# Patient Record
Sex: Female | Born: 1940 | Race: White | Hispanic: No | Marital: Married | Smoking: Never smoker
Health system: Southern US, Community
[De-identification: ages and names within clinical notes are randomized; demographics above are authoritative.]

## PROBLEM LIST (undated history)

## (undated) DIAGNOSIS — E785 Hyperlipidemia, unspecified: Secondary | ICD-10-CM

## (undated) DIAGNOSIS — T8859XA Other complications of anesthesia, initial encounter: Secondary | ICD-10-CM

## (undated) DIAGNOSIS — E119 Type 2 diabetes mellitus without complications: Secondary | ICD-10-CM

## (undated) DIAGNOSIS — R112 Nausea with vomiting, unspecified: Secondary | ICD-10-CM

## (undated) DIAGNOSIS — Z9889 Other specified postprocedural states: Secondary | ICD-10-CM

## (undated) DIAGNOSIS — T7840XA Allergy, unspecified, initial encounter: Secondary | ICD-10-CM

## (undated) DIAGNOSIS — I10 Essential (primary) hypertension: Secondary | ICD-10-CM

## (undated) DIAGNOSIS — D649 Anemia, unspecified: Secondary | ICD-10-CM

## (undated) HISTORY — DX: Type 2 diabetes mellitus without complications: E11.9

## (undated) HISTORY — DX: Anemia, unspecified: D64.9

## (undated) HISTORY — DX: Allergy, unspecified, initial encounter: T78.40XA

## (undated) HISTORY — PX: CHOLECYSTECTOMY: SHX55

## (undated) HISTORY — PX: APPENDECTOMY: SHX54

## (undated) HISTORY — PX: ABDOMINAL HYSTERECTOMY: SHX81

## (undated) HISTORY — DX: Hyperlipidemia, unspecified: E78.5

## (undated) HISTORY — DX: Essential (primary) hypertension: I10

---

## 2004-03-06 ENCOUNTER — Other Ambulatory Visit: Admission: RE | Admit: 2004-03-06 | Discharge: 2004-03-06 | Payer: Self-pay | Admitting: Family Medicine

## 2004-03-06 ENCOUNTER — Ambulatory Visit (HOSPITAL_COMMUNITY): Admission: RE | Admit: 2004-03-06 | Discharge: 2004-03-06 | Payer: Self-pay | Admitting: Family Medicine

## 2010-06-26 ENCOUNTER — Encounter: Admission: RE | Admit: 2010-06-26 | Discharge: 2010-06-26 | Payer: Self-pay | Admitting: Orthopedic Surgery

## 2011-09-02 ENCOUNTER — Other Ambulatory Visit: Payer: Self-pay | Admitting: Family Medicine

## 2011-09-02 ENCOUNTER — Ambulatory Visit
Admission: RE | Admit: 2011-09-02 | Discharge: 2011-09-02 | Disposition: A | Discharge status: Home / Self Care | Payer: Medicare Other | Source: Ambulatory Visit | Attending: Family Medicine | Admitting: Family Medicine

## 2011-09-02 DIAGNOSIS — M7989 Other specified soft tissue disorders: Secondary | ICD-10-CM

## 2015-05-30 DIAGNOSIS — Z961 Presence of intraocular lens: Secondary | ICD-10-CM | POA: Diagnosis not present

## 2015-05-30 DIAGNOSIS — H25813 Combined forms of age-related cataract, bilateral: Secondary | ICD-10-CM | POA: Diagnosis not present

## 2015-08-25 DIAGNOSIS — Z111 Encounter for screening for respiratory tuberculosis: Secondary | ICD-10-CM | POA: Diagnosis not present

## 2015-09-04 DIAGNOSIS — Z23 Encounter for immunization: Secondary | ICD-10-CM | POA: Diagnosis not present

## 2015-09-04 DIAGNOSIS — I10 Essential (primary) hypertension: Secondary | ICD-10-CM | POA: Diagnosis not present

## 2015-09-04 DIAGNOSIS — E7800 Pure hypercholesterolemia, unspecified: Secondary | ICD-10-CM | POA: Diagnosis not present

## 2015-09-04 DIAGNOSIS — K219 Gastro-esophageal reflux disease without esophagitis: Secondary | ICD-10-CM | POA: Diagnosis not present

## 2015-09-04 DIAGNOSIS — E119 Type 2 diabetes mellitus without complications: Secondary | ICD-10-CM | POA: Diagnosis not present

## 2017-04-08 ENCOUNTER — Encounter: Payer: Self-pay | Admitting: Physician Assistant

## 2017-04-08 ENCOUNTER — Ambulatory Visit (INDEPENDENT_AMBULATORY_CARE_PROVIDER_SITE_OTHER): Payer: Medicare Other | Admitting: Physician Assistant

## 2017-04-08 VITALS — BP 146/90 | HR 66 | Temp 98.6°F | Resp 16 | Ht 61.25 in | Wt 183.8 lb

## 2017-04-08 DIAGNOSIS — J209 Acute bronchitis, unspecified: Secondary | ICD-10-CM | POA: Diagnosis not present

## 2017-04-08 MED ORDER — AZITHROMYCIN 250 MG PO TABS
ORAL_TABLET | ORAL | 0 refills | Status: AC
Start: 2017-04-08 — End: 2017-04-13

## 2017-04-08 MED ORDER — GUAIFENESIN ER 1200 MG PO TB12: 1.0000 | ORAL_TABLET | Freq: Two times a day (BID) | ORAL | 0 refills | Status: AC

## 2017-04-08 MED ORDER — PROMETHAZINE-DM 6.25-15 MG/5ML PO SYRP: 2.5000 mL | ORAL_SOLUTION | Freq: Four times a day (QID) | ORAL | 0 refills | Status: DC | PRN

## 2017-04-08 NOTE — Patient Instructions (Addendum)
  Please push fluids.  Tylenol and Motrin for fever and body aches.      Nasal saline spray can be helpful to keep the mucus membranes moist and thin the nasal mucus    IF you received an x-ray today, you will receive an invoice from [facility]. Please contact [facility] at [phone] with questions or concerns regarding your invoice.   IF you received labwork today, you will receive an invoice from [facility]. Please contact [facility] at [phone] with questions or concerns regarding your invoice.   Our billing staff will not be able to assist you with questions regarding bills from these companies.  You will be contacted with the lab results as soon as they are available. The fastest way to get your results is to activate your My Chart account. Instructions are located on the last page of this paperwork. If you have not heard from us regarding the results in 2 weeks, please contact this office.     

## 2017-04-08 NOTE — Progress Notes (Signed)
   Amanda Gordon  MRN: [id number] DOB: 15-Nov-1940  PCP: System, Provider Not In  Chief Complaint  Patient presents with  . Chest congestion    symptoms x 2 weeks  . Sore Throat  . Nasal Congestion  . Cough    Subjective:  Pt presents to clinic for cold symptoms for 2 weeks.  She started with a sore throat that is starting to improve - her congestion is just starting to get better  - she has had cough since the beginning - the cough is worse at night and overall it is not much better.  The cough is dry but she tries to stop the cough to not cough up phlegm - the cough is deep in her chest.  She has been using salt warm gargles, cold preps  Review of Systems  Constitutional: Negative for chills and fever.  HENT: Positive for congestion, postnasal drip, rhinorrhea (white) and sore throat (improved).   Respiratory: Positive for cough.   Gastrointestinal: Positive for vomiting (1 episode).  Allergic/Immunologic: Positive for environmental allergies (this is different).    There are no active problems to display for this patient.   No current outpatient prescriptions on file prior to visit.   No current facility-administered medications on file prior to visit.     Allergies  Allergen Reactions  . Codeine Nausea Only    Pt patients past, family and social history were reviewed and updated.   Objective:  BP (!) 146/90   Pulse 66   Temp 98.6 F (37 C) (Oral)   Resp 16   Ht 5' 1.25" (1.556 m)   Wt 183 lb 12.8 oz (83.4 kg)   SpO2 95%   BMI 34.45 kg/m   Physical Exam  Constitutional: She is oriented to person, place, and time and well-developed, well-nourished, and in no distress.  HENT:  Head: Normocephalic and atraumatic.  Right Ear: Hearing, tympanic membrane, external ear and ear canal normal.  Left Ear: Hearing, tympanic membrane, external ear and ear canal normal.  Nose: Mucosal edema (pale and swollen) present.  Mouth/Throat: Uvula is midline, oropharynx is  clear and moist and mucous membranes are normal.  Eyes: Conjunctivae are normal.  Neck: Normal range of motion.  Cardiovascular: Normal rate, regular rhythm and normal heart sounds.   No murmur heard. Pulmonary/Chest: Effort normal and breath sounds normal. She has no wheezes. She exhibits no tenderness.  Deep cough   Neurological: She is alert and oriented to person, place, and time. Gait normal.  Skin: Skin is warm and dry.  Psychiatric: Mood, memory, affect and judgment normal.  Vitals reviewed.   Assessment and Plan :  Acute bronchitis, unspecified organism - Plan: promethazine-dextromethorphan (PROMETHAZINE-DM) 6.25-15 MG/5ML syrup, Guaifenesin (MUCINEX MAXIMUM STRENGTH) 1200 MG TB12, azithromycin (ZITHROMAX Z-PAK) 250 MG tablet, Care order/instruction:  Due to length of time of illness will cover for bacterial bronchitis - pt should use mucinex to help thin the mucus that sounds like is in her chest - she will use cough medications at night - she will use all of abx and push fluids - it is ok to use tylenol and motrin if she does not feel good  Windell Hummingbird PA-C  [facility] [facility] 04/08/2017 11:27 AM

## 2020-07-14 DIAGNOSIS — E785 Hyperlipidemia, unspecified: Secondary | ICD-10-CM | POA: Diagnosis not present

## 2020-07-14 DIAGNOSIS — K219 Gastro-esophageal reflux disease without esophagitis: Secondary | ICD-10-CM | POA: Diagnosis not present

## 2020-07-14 DIAGNOSIS — M1990 Unspecified osteoarthritis, unspecified site: Secondary | ICD-10-CM | POA: Diagnosis not present

## 2020-07-14 DIAGNOSIS — I10 Essential (primary) hypertension: Secondary | ICD-10-CM | POA: Diagnosis not present

## 2020-07-14 DIAGNOSIS — E669 Obesity, unspecified: Secondary | ICD-10-CM | POA: Diagnosis not present

## 2020-07-14 DIAGNOSIS — Z7984 Long term (current) use of oral hypoglycemic drugs: Secondary | ICD-10-CM | POA: Diagnosis not present

## 2020-07-14 DIAGNOSIS — Z6833 Body mass index (BMI) 33.0-33.9, adult: Secondary | ICD-10-CM | POA: Diagnosis not present

## 2020-07-14 DIAGNOSIS — G8929 Other chronic pain: Secondary | ICD-10-CM | POA: Diagnosis not present

## 2020-07-14 DIAGNOSIS — E119 Type 2 diabetes mellitus without complications: Secondary | ICD-10-CM | POA: Diagnosis not present

## 2020-08-11 DIAGNOSIS — Z1231 Encounter for screening mammogram for malignant neoplasm of breast: Secondary | ICD-10-CM | POA: Diagnosis not present

## 2020-10-13 DIAGNOSIS — M810 Age-related osteoporosis without current pathological fracture: Secondary | ICD-10-CM | POA: Diagnosis not present

## 2020-10-13 DIAGNOSIS — E1169 Type 2 diabetes mellitus with other specified complication: Secondary | ICD-10-CM | POA: Diagnosis not present

## 2020-10-13 DIAGNOSIS — I10 Essential (primary) hypertension: Secondary | ICD-10-CM | POA: Diagnosis not present

## 2020-10-13 DIAGNOSIS — Z1389 Encounter for screening for other disorder: Secondary | ICD-10-CM | POA: Diagnosis not present

## 2020-10-13 DIAGNOSIS — E7800 Pure hypercholesterolemia, unspecified: Secondary | ICD-10-CM | POA: Diagnosis not present

## 2020-10-13 DIAGNOSIS — E559 Vitamin D deficiency, unspecified: Secondary | ICD-10-CM | POA: Diagnosis not present

## 2020-10-13 DIAGNOSIS — Z0000 Encounter for general adult medical examination without abnormal findings: Secondary | ICD-10-CM | POA: Diagnosis not present

## 2020-10-13 DIAGNOSIS — K219 Gastro-esophageal reflux disease without esophagitis: Secondary | ICD-10-CM | POA: Diagnosis not present

## 2020-11-13 DIAGNOSIS — Z780 Asymptomatic menopausal state: Secondary | ICD-10-CM | POA: Diagnosis not present

## 2020-11-13 DIAGNOSIS — M8589 Other specified disorders of bone density and structure, multiple sites: Secondary | ICD-10-CM | POA: Diagnosis not present

## 2021-02-02 DIAGNOSIS — M1612 Unilateral primary osteoarthritis, left hip: Secondary | ICD-10-CM | POA: Diagnosis not present

## 2021-02-02 DIAGNOSIS — M1712 Unilateral primary osteoarthritis, left knee: Secondary | ICD-10-CM | POA: Diagnosis not present

## 2021-02-20 DIAGNOSIS — M25562 Pain in left knee: Secondary | ICD-10-CM | POA: Diagnosis not present

## 2021-03-02 DIAGNOSIS — M25562 Pain in left knee: Secondary | ICD-10-CM | POA: Diagnosis not present

## 2021-03-11 DIAGNOSIS — E1169 Type 2 diabetes mellitus with other specified complication: Secondary | ICD-10-CM | POA: Diagnosis not present

## 2021-03-11 DIAGNOSIS — M1712 Unilateral primary osteoarthritis, left knee: Secondary | ICD-10-CM | POA: Diagnosis not present

## 2021-03-11 DIAGNOSIS — Z01818 Encounter for other preprocedural examination: Secondary | ICD-10-CM | POA: Diagnosis not present

## 2021-03-11 DIAGNOSIS — Z7984 Long term (current) use of oral hypoglycemic drugs: Secondary | ICD-10-CM | POA: Diagnosis not present

## 2021-03-11 DIAGNOSIS — I10 Essential (primary) hypertension: Secondary | ICD-10-CM | POA: Diagnosis not present

## 2021-03-11 DIAGNOSIS — E7800 Pure hypercholesterolemia, unspecified: Secondary | ICD-10-CM | POA: Diagnosis not present

## 2021-03-30 DIAGNOSIS — I10 Essential (primary) hypertension: Secondary | ICD-10-CM | POA: Diagnosis not present

## 2021-03-30 DIAGNOSIS — E559 Vitamin D deficiency, unspecified: Secondary | ICD-10-CM | POA: Diagnosis not present

## 2021-03-30 DIAGNOSIS — M810 Age-related osteoporosis without current pathological fracture: Secondary | ICD-10-CM | POA: Diagnosis not present

## 2021-03-30 DIAGNOSIS — K219 Gastro-esophageal reflux disease without esophagitis: Secondary | ICD-10-CM | POA: Diagnosis not present

## 2021-03-30 DIAGNOSIS — E7800 Pure hypercholesterolemia, unspecified: Secondary | ICD-10-CM | POA: Diagnosis not present

## 2021-03-30 DIAGNOSIS — E1169 Type 2 diabetes mellitus with other specified complication: Secondary | ICD-10-CM | POA: Diagnosis not present

## 2021-03-30 DIAGNOSIS — J309 Allergic rhinitis, unspecified: Secondary | ICD-10-CM | POA: Diagnosis not present

## 2021-04-22 DIAGNOSIS — H02831 Dermatochalasis of right upper eyelid: Secondary | ICD-10-CM | POA: Diagnosis not present

## 2021-04-22 DIAGNOSIS — E119 Type 2 diabetes mellitus without complications: Secondary | ICD-10-CM | POA: Diagnosis not present

## 2021-04-22 DIAGNOSIS — Z961 Presence of intraocular lens: Secondary | ICD-10-CM | POA: Diagnosis not present

## 2021-04-22 DIAGNOSIS — H26493 Other secondary cataract, bilateral: Secondary | ICD-10-CM | POA: Diagnosis not present

## 2021-04-22 DIAGNOSIS — H1045 Other chronic allergic conjunctivitis: Secondary | ICD-10-CM | POA: Diagnosis not present

## 2021-04-22 DIAGNOSIS — H02834 Dermatochalasis of left upper eyelid: Secondary | ICD-10-CM | POA: Diagnosis not present

## 2021-04-22 DIAGNOSIS — H35413 Lattice degeneration of retina, bilateral: Secondary | ICD-10-CM | POA: Diagnosis not present

## 2021-04-22 DIAGNOSIS — H4321 Crystalline deposits in vitreous body, right eye: Secondary | ICD-10-CM | POA: Diagnosis not present

## 2021-04-22 DIAGNOSIS — H04123 Dry eye syndrome of bilateral lacrimal glands: Secondary | ICD-10-CM | POA: Diagnosis not present

## 2021-04-22 DIAGNOSIS — H31091 Other chorioretinal scars, right eye: Secondary | ICD-10-CM | POA: Diagnosis not present

## 2021-05-22 DIAGNOSIS — U071 COVID-19: Secondary | ICD-10-CM | POA: Diagnosis not present

## 2021-06-15 DIAGNOSIS — E1169 Type 2 diabetes mellitus with other specified complication: Secondary | ICD-10-CM | POA: Diagnosis not present

## 2021-06-30 DIAGNOSIS — E1169 Type 2 diabetes mellitus with other specified complication: Secondary | ICD-10-CM | POA: Diagnosis not present

## 2021-06-30 DIAGNOSIS — R110 Nausea: Secondary | ICD-10-CM | POA: Diagnosis not present

## 2021-07-07 DIAGNOSIS — M1712 Unilateral primary osteoarthritis, left knee: Secondary | ICD-10-CM | POA: Diagnosis not present

## 2021-07-07 DIAGNOSIS — M1612 Unilateral primary osteoarthritis, left hip: Secondary | ICD-10-CM | POA: Diagnosis not present

## 2021-07-14 NOTE — Progress Notes (Signed)
Sent message, via epic in basket, requesting orders in epic from surgeon.  

## 2021-07-17 DIAGNOSIS — K921 Melena: Secondary | ICD-10-CM | POA: Diagnosis not present

## 2021-07-17 DIAGNOSIS — K5900 Constipation, unspecified: Secondary | ICD-10-CM | POA: Diagnosis not present

## 2021-07-17 DIAGNOSIS — R110 Nausea: Secondary | ICD-10-CM | POA: Diagnosis not present

## 2021-07-21 ENCOUNTER — Encounter: Payer: Self-pay | Admitting: Physician Assistant

## 2021-07-21 ENCOUNTER — Encounter: Payer: Self-pay | Admitting: Orthopedic Surgery

## 2021-07-21 DIAGNOSIS — T7840XA Allergy, unspecified, initial encounter: Secondary | ICD-10-CM | POA: Insufficient documentation

## 2021-07-21 DIAGNOSIS — M1712 Unilateral primary osteoarthritis, left knee: Secondary | ICD-10-CM

## 2021-07-21 HISTORY — DX: Unilateral primary osteoarthritis, left knee: M17.12

## 2021-07-21 NOTE — Patient Instructions (Signed)
DUE TO COVID-19 ONLY ONE VISITOR IS ALLOWED TO COME WITH YOU AND STAY IN THE WAITING ROOM ONLY DURING PRE OP AND PROCEDURE.   **NO VISITORS ARE ALLOWED IN THE SHORT STAY AREA OR RECOVERY ROOM!!**  IF YOU WILL BE ADMITTED INTO THE HOSPITAL YOU ARE ALLOWED ONLY TWO SUPPORT PEOPLE DURING VISITATION HOURS ONLY (10AM -8PM)   The support person(s) may change daily. The support person(s) must pass our screening, gel in and out, and wear a mask at all times, including in the patient's room. Patients must also wear a mask when staff or their support person are in the room.  No visitors under the age of 4. Any visitor under the age of 13 must be accompanied by an adult.    COVID SWAB TESTING MUST BE COMPLETED ON:  07/30/21 **MUST PRESENT COMPLETED FORM AT TESTING SITE**    [place]  (backside of the building) You are not required to quarantine, however you are required to wear a well-fitted mask when you are out and around people not in your household.  Hand Hygiene often Do NOT share personal items Notify your provider if you are in close contact with someone who has COVID or you develop fever 100.4 or greater, new onset of sneezing, cough, sore throat, shortness of breath or body aches.  [place], [address], must go inside of the hospital, NOT A DRIVE THRU!  (Must self quarantine after testing. Follow instructions on handout.)       Your procedure is scheduled on: 08/03/21   Report to [facility] Main Entrance   Report to Short Stay at 5:15 AM   [facility])   Call this number if you have problems the morning of surgery [phone]   Do not eat food :After Midnight.   May have liquids until : 4:15 AM   day of surgery  CLEAR LIQUID DIET  Foods Allowed                                                                     Foods Excluded  Water, Black Coffee and tea, regular and decaf                              liquids that you cannot  Plain Jell-O in any flavor  (No red)                                           see through such as: Fruit ices (not with fruit pulp)                                     milk, soups, orange juice              Iced Popsicles (No red)  All solid food                                   Apple juices Sports drinks like Gatorade (No red) Lightly seasoned clear broth or consume(fat free) Sugar,  Sample Menu Breakfast                                Lunch                                     Supper Cranberry juice                    Beef broth                            Chicken broth Jell-O                                     Grape juice                           Apple juice Coffee or tea                        Jell-O                                      Popsicle                                                Coffee or tea                        Coffee or tea      Complete one Gatorade drink the morning of surgery at : 4:15 AM      the day of surgery.     The day of surgery:  Drink ONE (1) Pre-Surgery Clear Ensure or G2 by am the morning of surgery. Drink in one sitting. Do not sip.  This drink was given to you during your hospital  pre-op appointment visit. Nothing else to drink after completing the  Pre-Surgery Clear Ensure or G2.          If you have questions, please contact your surgeon's office.     Oral Hygiene is also important to reduce your risk of infection.                                    Remember - BRUSH YOUR TEETH THE MORNING OF SURGERY WITH YOUR REGULAR TOOTHPASTE   Do NOT smoke after Midnight   Take these medicines the morning of surgery with A SIP OF WATER: metoprolol,amlodipine,omeprazole.  How to Manage Your Diabetes Before and After Surgery  Why is it important to control my blood sugar before and after surgery? Improving blood sugar levels  before and after surgery helps healing and  can limit problems. A way of improving blood sugar control is eating a healthy diet by:  Eating less sugar and carbohydrates  Increasing activity/exercise  Talking with your doctor about reaching your blood sugar goals High blood sugars (greater than 180 mg/dL) can raise your risk of infections and slow your recovery, so you will need to focus on controlling your diabetes during the weeks before surgery. Make sure that the doctor who takes care of your diabetes knows about your planned surgery including the date and location.  How do I manage my blood sugar before surgery? Check your blood sugar at least 4 times a day, starting 2 days before surgery, to make sure that the level is not too high or low. Check your blood sugar the morning of your surgery when you wake up and every 2 hours until you get to the Short Stay unit. If your blood sugar is less than 70 mg/dL, you will need to treat for low blood sugar: Do not take insulin. Treat a low blood sugar (less than 70 mg/dL) with  cup of clear juice (cranberry or apple), 4 glucose tablets, OR glucose gel. Recheck blood sugar in 15 minutes after treatment (to make sure it is greater than 70 mg/dL). If your blood sugar is not greater than 70 mg/dL on recheck, call [phone] for further instructions. Report your blood sugar to the short stay nurse when you get to Short Stay.  If you are admitted to the hospital after surgery: Your blood sugar will be checked by the staff and you will probably be given insulin after surgery (instead of oral diabetes medicines) to make sure you have good blood sugar levels. The goal for blood sugar control after surgery is 80-180 mg/dL.   WHAT DO I DO ABOUT MY DIABETES MEDICATION?  Do not take oral diabetes medicines (pills) the morning of surgery.  THE Day BEFORE SURGERY, take metformin as usual.       THE MORNING OF SURGERY, DO NOT TAKE ANY ORAL DIABETIC MEDICATIONS DAY OF YOUR SURGERY                               You may not have any metal on your body including hair pins, jewelry, and body piercing             Do not wear make-up, lotions, powders, perfumes/cologne, or deodorant  Do not wear nail polish including gel and S&S, artificial/acrylic nails, or any other type of covering on natural nails including finger and toenails. If you have artificial nails, gel coating, etc. that needs to be removed by a nail salon please have this removed prior to surgery or surgery may need to be canceled/ delayed if the surgeon/ anesthesia feels like they are unable to be safely monitored.   Do not shave  48 hours prior to surgery.    Do not bring valuables to the hospital. [place].   Contacts, dentures or bridgework may not be worn into surgery.   Bring small overnight bag day of surgery.    Patients discharged on the day of surgery will not be allowed to drive home.   Special Instructions: Bring a copy of your healthcare power of attorney and living will documents  the day of surgery if you haven't scanned them before.              Please read over the following fact sheets you were given: IF YOU HAVE QUESTIONS ABOUT YOUR PRE-OP INSTRUCTIONS PLEASE CALL [phone]   [facility] - Preparing for Surgery Before surgery, you can play an important role.  Because skin is not sterile, your skin needs to be as free of germs as possible.  You can reduce the number of germs on your skin by washing with CHG (chlorahexidine gluconate) soap before surgery.  CHG is an antiseptic cleaner which kills germs and bonds with the skin to continue killing germs even after washing. Please DO NOT use if you have an allergy to CHG or antibacterial soaps.  If your skin becomes reddened/irritated stop using the CHG and inform your nurse when you arrive at Short Stay. Do not shave (including legs and underarms) for at least 48 hours prior to the first CHG shower.  You  may shave your face/neck. Please follow these instructions carefully:  1.  Shower with CHG Soap the night before surgery and the  morning of Surgery.  2.  If you choose to wash your hair, wash your hair first as usual with your  normal  shampoo.  3.  After you shampoo, rinse your hair and body thoroughly to remove the  shampoo.                           4.  Use CHG as you would any other liquid soap.  You can apply chg directly  to the skin and wash                       Gently with a scrungie or clean washcloth.  5.  Apply the CHG Soap to your body ONLY FROM THE NECK DOWN.   Do not use on face/ open                           Wound or open sores. Avoid contact with eyes, ears mouth and genitals (private parts).                       Wash face,  Genitals (private parts) with your normal soap.             6.  Wash thoroughly, paying special attention to the area where your surgery  will be performed.  7.  Thoroughly rinse your body with warm water from the neck down.  8.  DO NOT shower/wash with your normal soap after using and rinsing off  the CHG Soap.                9.  Pat yourself dry with a clean towel.            10.  Wear clean pajamas.            11.  Place clean sheets on your bed the night of your first shower and do not  sleep with pets. Day of Surgery : Do not apply any lotions/deodorants the morning of surgery.  Please wear clean clothes to the hospital/surgery center.  FAILURE TO FOLLOW THESE INSTRUCTIONS MAY RESULT IN THE CANCELLATION OF YOUR SURGERY PATIENT SIGNATURE_________________________________  NURSE SIGNATURE__________________________________  ________________________________________________________________________   Adam Phenix  An incentive spirometer is a tool that  can help keep your lungs clear and active. This tool measures how well you are filling your lungs with each breath. Taking long deep breaths may help reverse or decrease the chance of developing  breathing (pulmonary) problems (especially infection) following: A long period of time when you are unable to move or be active. BEFORE THE PROCEDURE  If the spirometer includes an indicator to show your best effort, your nurse or respiratory therapist will set it to a desired goal. If possible, sit up straight or lean slightly forward. Try not to slouch. Hold the incentive spirometer in an upright position. INSTRUCTIONS FOR USE  Sit on the edge of your bed if possible, or sit up as far as you can in bed or on a chair. Hold the incentive spirometer in an upright position. Breathe out normally. Place the mouthpiece in your mouth and seal your lips tightly around it. Breathe in slowly and as deeply as possible, raising the piston or the ball toward the top of the column. Hold your breath for 3-5 seconds or for as long as possible. Allow the piston or ball to fall to the bottom of the column. Remove the mouthpiece from your mouth and breathe out normally. Rest for a few seconds and repeat Steps 1 through 7 at least 10 times every 1-2 hours when you are awake. Take your time and take a few normal breaths between deep breaths. The spirometer may include an indicator to show your best effort. Use the indicator as a goal to work toward during each repetition. After each set of 10 deep breaths, practice coughing to be sure your lungs are clear. If you have an incision (the cut made at the time of surgery), support your incision when coughing by placing a pillow or rolled up towels firmly against it. Once you are able to get out of bed, walk around indoors and cough well. You may stop using the incentive spirometer when instructed by your caregiver.  RISKS AND COMPLICATIONS Take your time so you do not get dizzy or light-headed. If you are in pain, you may need to take or ask for pain medication before doing incentive spirometry. It is harder to take a deep breath if you are having pain. AFTER USE Rest  and breathe slowly and easily. It can be helpful to keep track of a log of your progress. Your caregiver can provide you with a simple table to help with this. If you are using the spirometer at home, follow these instructions: [place] IF:  You are having difficultly using the spirometer. You have trouble using the spirometer as often as instructed. Your pain medication is not giving enough relief while using the spirometer. You develop fever of 100.5 F (38.1 C) or higher. SEEK IMMEDIATE MEDICAL CARE IF:  You cough up bloody sputum that had not been present before. You develop fever of 102 F (38.9 C) or greater. You develop worsening pain at or near the incision site. MAKE SURE YOU:  Understand these instructions. Will watch your condition. Will get help right away if you are not doing well or get worse. Document Released: 02/14/2007 Document Revised: 12/27/2011 Document Reviewed: 04/17/2007 [facility] Patient Information 2014 [place], [place].   ________________________________________________________________________

## 2021-07-23 ENCOUNTER — Encounter (HOSPITAL_COMMUNITY)
Admission: RE | Admit: 2021-07-23 | Discharge: 2021-07-23 | Disposition: A | Discharge status: Home / Self Care | Payer: Medicare Other | Source: Ambulatory Visit | Attending: Orthopedic Surgery | Admitting: Orthopedic Surgery

## 2021-07-23 ENCOUNTER — Other Ambulatory Visit: Payer: Self-pay

## 2021-07-23 ENCOUNTER — Encounter (HOSPITAL_COMMUNITY): Payer: Self-pay

## 2021-07-23 DIAGNOSIS — R0902 Hypoxemia: Secondary | ICD-10-CM | POA: Diagnosis not present

## 2021-07-23 DIAGNOSIS — E861 Hypovolemia: Secondary | ICD-10-CM | POA: Diagnosis not present

## 2021-07-23 DIAGNOSIS — E441 Mild protein-calorie malnutrition: Secondary | ICD-10-CM | POA: Diagnosis not present

## 2021-07-23 DIAGNOSIS — K2289 Other specified disease of esophagus: Secondary | ICD-10-CM | POA: Diagnosis not present

## 2021-07-23 DIAGNOSIS — Z859 Personal history of malignant neoplasm, unspecified: Secondary | ICD-10-CM | POA: Diagnosis not present

## 2021-07-23 DIAGNOSIS — J90 Pleural effusion, not elsewhere classified: Secondary | ICD-10-CM | POA: Diagnosis not present

## 2021-07-23 DIAGNOSIS — C786 Secondary malignant neoplasm of retroperitoneum and peritoneum: Secondary | ICD-10-CM | POA: Diagnosis not present

## 2021-07-23 DIAGNOSIS — E119 Type 2 diabetes mellitus without complications: Secondary | ICD-10-CM | POA: Insufficient documentation

## 2021-07-23 DIAGNOSIS — N83201 Unspecified ovarian cyst, right side: Secondary | ICD-10-CM | POA: Diagnosis not present

## 2021-07-23 DIAGNOSIS — C800 Disseminated malignant neoplasm, unspecified: Secondary | ICD-10-CM | POA: Diagnosis not present

## 2021-07-23 DIAGNOSIS — J9811 Atelectasis: Secondary | ICD-10-CM | POA: Diagnosis not present

## 2021-07-23 DIAGNOSIS — R180 Malignant ascites: Secondary | ICD-10-CM | POA: Diagnosis not present

## 2021-07-23 DIAGNOSIS — I2510 Atherosclerotic heart disease of native coronary artery without angina pectoris: Secondary | ICD-10-CM | POA: Diagnosis not present

## 2021-07-23 DIAGNOSIS — Z20822 Contact with and (suspected) exposure to covid-19: Secondary | ICD-10-CM | POA: Diagnosis not present

## 2021-07-23 DIAGNOSIS — R634 Abnormal weight loss: Secondary | ICD-10-CM | POA: Diagnosis not present

## 2021-07-23 DIAGNOSIS — D649 Anemia, unspecified: Secondary | ICD-10-CM | POA: Diagnosis not present

## 2021-07-23 DIAGNOSIS — Z9049 Acquired absence of other specified parts of digestive tract: Secondary | ICD-10-CM | POA: Diagnosis not present

## 2021-07-23 DIAGNOSIS — E871 Hypo-osmolality and hyponatremia: Secondary | ICD-10-CM | POA: Diagnosis not present

## 2021-07-23 DIAGNOSIS — Z794 Long term (current) use of insulin: Secondary | ICD-10-CM | POA: Diagnosis not present

## 2021-07-23 DIAGNOSIS — K6389 Other specified diseases of intestine: Secondary | ICD-10-CM | POA: Diagnosis not present

## 2021-07-23 DIAGNOSIS — R1900 Intra-abdominal and pelvic swelling, mass and lump, unspecified site: Secondary | ICD-10-CM | POA: Diagnosis not present

## 2021-07-23 DIAGNOSIS — Z833 Family history of diabetes mellitus: Secondary | ICD-10-CM | POA: Diagnosis not present

## 2021-07-23 DIAGNOSIS — R933 Abnormal findings on diagnostic imaging of other parts of digestive tract: Secondary | ICD-10-CM | POA: Diagnosis not present

## 2021-07-23 DIAGNOSIS — K5730 Diverticulosis of large intestine without perforation or abscess without bleeding: Secondary | ICD-10-CM | POA: Diagnosis not present

## 2021-07-23 DIAGNOSIS — R300 Dysuria: Secondary | ICD-10-CM | POA: Insufficient documentation

## 2021-07-23 DIAGNOSIS — Z01818 Encounter for other preprocedural examination: Secondary | ICD-10-CM | POA: Insufficient documentation

## 2021-07-23 DIAGNOSIS — E876 Hypokalemia: Secondary | ICD-10-CM | POA: Diagnosis not present

## 2021-07-23 DIAGNOSIS — E785 Hyperlipidemia, unspecified: Secondary | ICD-10-CM | POA: Diagnosis not present

## 2021-07-23 DIAGNOSIS — K3189 Other diseases of stomach and duodenum: Secondary | ICD-10-CM | POA: Diagnosis not present

## 2021-07-23 DIAGNOSIS — R109 Unspecified abdominal pain: Secondary | ICD-10-CM | POA: Diagnosis not present

## 2021-07-23 DIAGNOSIS — R9431 Abnormal electrocardiogram [ECG] [EKG]: Secondary | ICD-10-CM | POA: Diagnosis not present

## 2021-07-23 DIAGNOSIS — N83291 Other ovarian cyst, right side: Secondary | ICD-10-CM | POA: Diagnosis present

## 2021-07-23 DIAGNOSIS — Z8249 Family history of ischemic heart disease and other diseases of the circulatory system: Secondary | ICD-10-CM | POA: Diagnosis not present

## 2021-07-23 DIAGNOSIS — E46 Unspecified protein-calorie malnutrition: Secondary | ICD-10-CM | POA: Diagnosis not present

## 2021-07-23 DIAGNOSIS — Z90710 Acquired absence of both cervix and uterus: Secondary | ICD-10-CM | POA: Diagnosis not present

## 2021-07-23 DIAGNOSIS — R9389 Abnormal findings on diagnostic imaging of other specified body structures: Secondary | ICD-10-CM | POA: Diagnosis not present

## 2021-07-23 DIAGNOSIS — R404 Transient alteration of awareness: Secondary | ICD-10-CM | POA: Diagnosis not present

## 2021-07-23 DIAGNOSIS — R799 Abnormal finding of blood chemistry, unspecified: Secondary | ICD-10-CM | POA: Diagnosis not present

## 2021-07-23 DIAGNOSIS — R110 Nausea: Secondary | ICD-10-CM | POA: Diagnosis not present

## 2021-07-23 DIAGNOSIS — Z79899 Other long term (current) drug therapy: Secondary | ICD-10-CM | POA: Diagnosis not present

## 2021-07-23 DIAGNOSIS — Z743 Need for continuous supervision: Secondary | ICD-10-CM | POA: Diagnosis not present

## 2021-07-23 DIAGNOSIS — R188 Other ascites: Secondary | ICD-10-CM | POA: Diagnosis not present

## 2021-07-23 DIAGNOSIS — I10 Essential (primary) hypertension: Secondary | ICD-10-CM | POA: Diagnosis not present

## 2021-07-23 DIAGNOSIS — D7389 Other diseases of spleen: Secondary | ICD-10-CM | POA: Diagnosis not present

## 2021-07-23 DIAGNOSIS — K2970 Gastritis, unspecified, without bleeding: Secondary | ICD-10-CM | POA: Diagnosis not present

## 2021-07-23 DIAGNOSIS — C801 Malignant (primary) neoplasm, unspecified: Secondary | ICD-10-CM | POA: Diagnosis not present

## 2021-07-23 DIAGNOSIS — D630 Anemia in neoplastic disease: Secondary | ICD-10-CM | POA: Diagnosis not present

## 2021-07-23 DIAGNOSIS — R000 Tachycardia, unspecified: Secondary | ICD-10-CM | POA: Diagnosis not present

## 2021-07-23 DIAGNOSIS — R590 Localized enlarged lymph nodes: Secondary | ICD-10-CM | POA: Diagnosis not present

## 2021-07-23 DIAGNOSIS — I700 Atherosclerosis of aorta: Secondary | ICD-10-CM | POA: Diagnosis not present

## 2021-07-23 DIAGNOSIS — M1712 Unilateral primary osteoarthritis, left knee: Secondary | ICD-10-CM | POA: Diagnosis not present

## 2021-07-23 HISTORY — DX: Other complications of anesthesia, initial encounter: T88.59XA

## 2021-07-23 HISTORY — DX: Other specified postprocedural states: Z98.890

## 2021-07-23 HISTORY — DX: Nausea with vomiting, unspecified: R11.2

## 2021-07-23 LAB — CBC WITH DIFFERENTIAL/PLATELET
Abs Immature Granulocytes: 0.03 10*3/uL (ref 0.00–0.07)
Basophils Absolute: 0.1 10*3/uL (ref 0.0–0.1)
Basophils Relative: 1 %
Eosinophils Absolute: 0.1 10*3/uL (ref 0.0–0.5)
Eosinophils Relative: 1 %
HCT: 45.2 % (ref 36.0–46.0)
Hemoglobin: 14.4 g/dL (ref 12.0–15.0)
Immature Granulocytes: 0 %
Lymphocytes Relative: 12 %
Lymphs Abs: 1.3 10*3/uL (ref 0.7–4.0)
MCH: 27.2 pg (ref 26.0–34.0)
MCHC: 31.9 g/dL (ref 30.0–36.0)
MCV: 85.4 fL (ref 80.0–100.0)
Monocytes Absolute: 0.7 10*3/uL (ref 0.1–1.0)
Monocytes Relative: 7 %
Neutro Abs: 8 10*3/uL — ABNORMAL HIGH (ref 1.7–7.7)
Neutrophils Relative %: 79 %
Platelets: 512 10*3/uL — ABNORMAL HIGH (ref 150–400)
RBC: 5.29 MIL/uL — ABNORMAL HIGH (ref 3.87–5.11)
RDW: 15.6 % — ABNORMAL HIGH (ref 11.5–15.5)
WBC: 10.2 10*3/uL (ref 4.0–10.5)
nRBC: 0 % (ref 0.0–0.2)

## 2021-07-23 LAB — COMPREHENSIVE METABOLIC PANEL
ALT: 8 U/L (ref 0–44)
AST: 19 U/L (ref 15–41)
Albumin: 3.2 g/dL — ABNORMAL LOW (ref 3.5–5.0)
Alkaline Phosphatase: 97 U/L (ref 38–126)
Anion gap: 13 (ref 5–15)
BUN: 22 mg/dL (ref 8–23)
CO2: 30 mmol/L (ref 22–32)
Calcium: 9.5 mg/dL (ref 8.9–10.3)
Chloride: 94 mmol/L — ABNORMAL LOW (ref 98–111)
Creatinine, Ser: 0.68 mg/dL (ref 0.44–1.00)
GFR, Estimated: >60 mL/min (ref >60)
Glucose, Bld: 129 mg/dL — ABNORMAL HIGH (ref 70–99)
Potassium: 3.3 mmol/L — ABNORMAL LOW (ref 3.5–5.1)
Sodium: 137 mmol/L (ref 135–145)
Total Bilirubin: 0.5 mg/dL (ref 0.3–1.2)
Total Protein: 7 g/dL (ref 6.5–8.1)

## 2021-07-23 LAB — SURGICAL PCR SCREEN
MRSA, PCR: NEGATIVE
Staphylococcus aureus: NEGATIVE

## 2021-07-23 LAB — GLUCOSE, CAPILLARY: Glucose-Capillary: 137 mg/dL — ABNORMAL HIGH (ref 70–99)

## 2021-07-23 LAB — HEMOGLOBIN A1C
Hgb A1c MFr Bld: 6.5 % — ABNORMAL HIGH (ref 4.8–5.6)
Mean Plasma Glucose: 139.85 mg/dL

## 2021-07-23 NOTE — Progress Notes (Signed)
COVID Vaccine Completed: Yes Date COVID Vaccine completed: 06/2020 x 3 COVID vaccine manufacturer: [employer] Test: 07/30/21  PCP - No PCP Cardiologist -   Chest x-ray -  EKG -  Stress Test -  ECHO -  Cardiac Cath -  Pacemaker/ICD device last checked:  Sleep Study -  CPAP -   Fasting Blood Sugar - 100's Checks Blood Sugar ___3__ times a day  Blood Thinner Instructions: Aspirin Instructions: Last Dose:  Anesthesia review: Hx: HTN,DIA  Patient denies shortness of breath, fever, cough and chest pain at PAT appointment   Patient verbalized understanding of instructions that were given to them at the PAT appointment. Patient was also instructed that they will need to review over the PAT instructions again at home before surgery.

## 2021-07-24 ENCOUNTER — Inpatient Hospital Stay (HOSPITAL_COMMUNITY)
Admission: EM | Admit: 2021-07-24 | Discharge: 2021-08-05 | DRG: 375 | Disposition: A | Discharge status: Home Health | Payer: Medicare Other | Attending: Internal Medicine | Admitting: Internal Medicine

## 2021-07-24 ENCOUNTER — Encounter (HOSPITAL_COMMUNITY): Payer: Self-pay | Admitting: Physician Assistant

## 2021-07-24 ENCOUNTER — Observation Stay (HOSPITAL_COMMUNITY): Payer: Medicare Other

## 2021-07-24 ENCOUNTER — Emergency Department (HOSPITAL_COMMUNITY): Payer: Medicare Other

## 2021-07-24 ENCOUNTER — Encounter (HOSPITAL_COMMUNITY): Payer: Self-pay

## 2021-07-24 DIAGNOSIS — R590 Localized enlarged lymph nodes: Secondary | ICD-10-CM | POA: Diagnosis present

## 2021-07-24 DIAGNOSIS — Z90710 Acquired absence of both cervix and uterus: Secondary | ICD-10-CM

## 2021-07-24 DIAGNOSIS — R52 Pain, unspecified: Secondary | ICD-10-CM

## 2021-07-24 DIAGNOSIS — Z9049 Acquired absence of other specified parts of digestive tract: Secondary | ICD-10-CM

## 2021-07-24 DIAGNOSIS — R188 Other ascites: Secondary | ICD-10-CM | POA: Diagnosis not present

## 2021-07-24 DIAGNOSIS — E871 Hypo-osmolality and hyponatremia: Secondary | ICD-10-CM | POA: Diagnosis present

## 2021-07-24 DIAGNOSIS — Z20822 Contact with and (suspected) exposure to covid-19: Secondary | ICD-10-CM | POA: Diagnosis present

## 2021-07-24 DIAGNOSIS — I700 Atherosclerosis of aorta: Secondary | ICD-10-CM | POA: Diagnosis present

## 2021-07-24 DIAGNOSIS — K2970 Gastritis, unspecified, without bleeding: Secondary | ICD-10-CM | POA: Diagnosis present

## 2021-07-24 DIAGNOSIS — C786 Secondary malignant neoplasm of retroperitoneum and peritoneum: Principal | ICD-10-CM | POA: Diagnosis present

## 2021-07-24 DIAGNOSIS — Z79899 Other long term (current) drug therapy: Secondary | ICD-10-CM

## 2021-07-24 DIAGNOSIS — Z833 Family history of diabetes mellitus: Secondary | ICD-10-CM

## 2021-07-24 DIAGNOSIS — I10 Essential (primary) hypertension: Secondary | ICD-10-CM | POA: Diagnosis present

## 2021-07-24 DIAGNOSIS — N83201 Unspecified ovarian cyst, right side: Secondary | ICD-10-CM

## 2021-07-24 DIAGNOSIS — N83209 Unspecified ovarian cyst, unspecified side: Secondary | ICD-10-CM

## 2021-07-24 DIAGNOSIS — E861 Hypovolemia: Secondary | ICD-10-CM | POA: Diagnosis present

## 2021-07-24 DIAGNOSIS — R634 Abnormal weight loss: Secondary | ICD-10-CM | POA: Diagnosis present

## 2021-07-24 DIAGNOSIS — Z794 Long term (current) use of insulin: Secondary | ICD-10-CM

## 2021-07-24 DIAGNOSIS — Z8249 Family history of ischemic heart disease and other diseases of the circulatory system: Secondary | ICD-10-CM

## 2021-07-24 DIAGNOSIS — R180 Malignant ascites: Secondary | ICD-10-CM | POA: Diagnosis not present

## 2021-07-24 DIAGNOSIS — E441 Mild protein-calorie malnutrition: Secondary | ICD-10-CM | POA: Diagnosis present

## 2021-07-24 DIAGNOSIS — E876 Hypokalemia: Secondary | ICD-10-CM | POA: Diagnosis present

## 2021-07-24 DIAGNOSIS — C800 Disseminated malignant neoplasm, unspecified: Secondary | ICD-10-CM | POA: Diagnosis present

## 2021-07-24 DIAGNOSIS — Z885 Allergy status to narcotic agent status: Secondary | ICD-10-CM

## 2021-07-24 DIAGNOSIS — E119 Type 2 diabetes mellitus without complications: Secondary | ICD-10-CM | POA: Diagnosis present

## 2021-07-24 DIAGNOSIS — N83291 Other ovarian cyst, right side: Secondary | ICD-10-CM | POA: Diagnosis present

## 2021-07-24 DIAGNOSIS — E785 Hyperlipidemia, unspecified: Secondary | ICD-10-CM | POA: Diagnosis present

## 2021-07-24 DIAGNOSIS — M1712 Unilateral primary osteoarthritis, left knee: Secondary | ICD-10-CM | POA: Diagnosis present

## 2021-07-24 DIAGNOSIS — Z7984 Long term (current) use of oral hypoglycemic drugs: Secondary | ICD-10-CM

## 2021-07-24 DIAGNOSIS — I2510 Atherosclerotic heart disease of native coronary artery without angina pectoris: Secondary | ICD-10-CM | POA: Diagnosis present

## 2021-07-24 DIAGNOSIS — D630 Anemia in neoplastic disease: Secondary | ICD-10-CM | POA: Diagnosis present

## 2021-07-24 DIAGNOSIS — C801 Malignant (primary) neoplasm, unspecified: Secondary | ICD-10-CM | POA: Diagnosis present

## 2021-07-24 DIAGNOSIS — R9431 Abnormal electrocardiogram [ECG] [EKG]: Secondary | ICD-10-CM | POA: Diagnosis present

## 2021-07-24 HISTORY — DX: Type 2 diabetes mellitus without complications: E11.9

## 2021-07-24 LAB — COMPREHENSIVE METABOLIC PANEL
ALT: 9 U/L (ref 0–44)
AST: 17 U/L (ref 15–41)
Albumin: 3.3 g/dL — ABNORMAL LOW (ref 3.5–5.0)
Alkaline Phosphatase: 93 U/L (ref 38–126)
Anion gap: 12 (ref 5–15)
BUN: 24 mg/dL — ABNORMAL HIGH (ref 8–23)
CO2: 31 mmol/L (ref 22–32)
Calcium: 9.3 mg/dL (ref 8.9–10.3)
Chloride: 90 mmol/L — ABNORMAL LOW (ref 98–111)
Creatinine, Ser: 0.76 mg/dL (ref 0.44–1.00)
GFR, Estimated: >60 mL/min (ref >60)
Glucose, Bld: 134 mg/dL — ABNORMAL HIGH (ref 70–99)
Potassium: 3.4 mmol/L — ABNORMAL LOW (ref 3.5–5.1)
Sodium: 133 mmol/L — ABNORMAL LOW (ref 135–145)
Total Bilirubin: 0.5 mg/dL (ref 0.3–1.2)
Total Protein: 6.7 g/dL (ref 6.5–8.1)

## 2021-07-24 LAB — CBC WITH DIFFERENTIAL/PLATELET
Abs Immature Granulocytes: 0.03 10*3/uL (ref 0.00–0.07)
Basophils Absolute: 0.1 10*3/uL (ref 0.0–0.1)
Basophils Relative: 1 %
Eosinophils Absolute: 0.1 10*3/uL (ref 0.0–0.5)
Eosinophils Relative: 1 %
HCT: 44.6 % (ref 36.0–46.0)
Hemoglobin: 14.3 g/dL (ref 12.0–15.0)
Immature Granulocytes: 0 %
Lymphocytes Relative: 9 %
Lymphs Abs: 0.9 10*3/uL (ref 0.7–4.0)
MCH: 27.2 pg (ref 26.0–34.0)
MCHC: 32.1 g/dL (ref 30.0–36.0)
MCV: 85 fL (ref 80.0–100.0)
Monocytes Absolute: 0.8 10*3/uL (ref 0.1–1.0)
Monocytes Relative: 8 %
Neutro Abs: 8.2 10*3/uL — ABNORMAL HIGH (ref 1.7–7.7)
Neutrophils Relative %: 81 %
Platelets: 477 10*3/uL — ABNORMAL HIGH (ref 150–400)
RBC: 5.25 MIL/uL — ABNORMAL HIGH (ref 3.87–5.11)
RDW: 15.3 % (ref 11.5–15.5)
WBC: 10 10*3/uL (ref 4.0–10.5)
nRBC: 0 % (ref 0.0–0.2)

## 2021-07-24 LAB — LACTATE DEHYDROGENASE, PLEURAL OR PERITONEAL FLUID: LD, Fluid: 323 U/L — ABNORMAL HIGH (ref 3–23)

## 2021-07-24 LAB — BODY FLUID CELL COUNT WITH DIFFERENTIAL
Eos, Fluid: 0 %
Lymphs, Fluid: 31 %
Monocyte-Macrophage-Serous Fluid: 61 % (ref 50–90)
Neutrophil Count, Fluid: 8 % (ref 0–25)
Total Nucleated Cell Count, Fluid: 489 cu mm (ref 0–1000)

## 2021-07-24 LAB — ALBUMIN, PLEURAL OR PERITONEAL FLUID: Albumin, Fluid: 2.7 g/dL

## 2021-07-24 LAB — URINE CULTURE: Culture: <10000 — AB

## 2021-07-24 LAB — GLUCOSE, PLEURAL OR PERITONEAL FLUID: Glucose, Fluid: 66 mg/dL

## 2021-07-24 LAB — RESP PANEL BY RT-PCR (FLU A&B, COVID) ARPGX2
Influenza A by PCR: NEGATIVE
Influenza B by PCR: NEGATIVE
SARS Coronavirus 2 by RT PCR: NEGATIVE

## 2021-07-24 LAB — PHOSPHORUS: Phosphorus: 4 mg/dL (ref 2.5–4.6)

## 2021-07-24 LAB — PROTEIN, PLEURAL OR PERITONEAL FLUID: Total protein, fluid: 4.2 g/dL

## 2021-07-24 LAB — LIPASE, BLOOD: Lipase: 18 U/L (ref 11–51)

## 2021-07-24 LAB — MAGNESIUM: Magnesium: 1.9 mg/dL (ref 1.7–2.4)

## 2021-07-24 MED ORDER — LISINOPRIL 20 MG PO TABS
20.0000 mg | ORAL_TABLET | Freq: Every day | ORAL | Status: DC
  Administered 2021-07-25 – 2021-07-30 (×4): 20 mg via ORAL
  Filled 2021-07-24 (×6): qty 1

## 2021-07-24 MED ORDER — LIDOCAINE-EPINEPHRINE (PF) 2 %-1:200000 IJ SOLN
INTRAMUSCULAR | Status: AC
  Administered 2021-07-24: 6 mL
  Filled 2021-07-24: qty 20

## 2021-07-24 MED ORDER — AMLODIPINE BESYLATE 10 MG PO TABS
10.0000 mg | ORAL_TABLET | Freq: Every day | ORAL | Status: DC
  Administered 2021-07-25 – 2021-08-05 (×10): 10 mg via ORAL
  Filled 2021-07-24 (×12): qty 1

## 2021-07-24 MED ORDER — IOHEXOL 350 MG/ML SOLN
75.0000 mL | Freq: Once | INTRAVENOUS | Status: AC | PRN
  Administered 2021-07-24: 75 mL via INTRAVENOUS

## 2021-07-24 MED ORDER — ONDANSETRON HCL 4 MG PO TABS: 4.0000 mg | ORAL_TABLET | Freq: Four times a day (QID) | ORAL | Status: DC | PRN

## 2021-07-24 MED ORDER — PANTOPRAZOLE SODIUM 40 MG PO TBEC
40.0000 mg | DELAYED_RELEASE_TABLET | Freq: Every day | ORAL | Status: DC
  Administered 2021-07-25 – 2021-08-05 (×12): 40 mg via ORAL
  Filled 2021-07-24 (×12): qty 1

## 2021-07-24 MED ORDER — ACETAMINOPHEN 325 MG PO TABS
650.0000 mg | ORAL_TABLET | Freq: Four times a day (QID) | ORAL | Status: DC | PRN
Start: 2021-07-24 — End: 2021-07-31
  Administered 2021-07-24 – 2021-07-30 (×14): 650 mg via ORAL
  Filled 2021-07-24 (×15): qty 2

## 2021-07-24 MED ORDER — IOHEXOL 350 MG/ML SOLN
80.0000 mL | Freq: Once | INTRAVENOUS | Status: AC | PRN
  Administered 2021-07-24: 80 mL via INTRAVENOUS

## 2021-07-24 MED ORDER — MAGNESIUM SULFATE 2 GM/50ML IV SOLN
2.0000 g | Freq: Once | INTRAVENOUS | Status: AC
  Administered 2021-07-24: 2 g via INTRAVENOUS
  Filled 2021-07-24: qty 50

## 2021-07-24 MED ORDER — METOPROLOL SUCCINATE ER 100 MG PO TB24
100.0000 mg | ORAL_TABLET | Freq: Every day | ORAL | Status: DC
  Administered 2021-07-24 – 2021-08-03 (×8): 100 mg via ORAL
  Filled 2021-07-24 (×10): qty 1
  Filled 2021-07-24: qty 2
  Filled 2021-07-24: qty 1

## 2021-07-24 MED ORDER — POTASSIUM CHLORIDE 10 MEQ/100ML IV SOLN
10.0000 meq | INTRAVENOUS | Status: AC
  Administered 2021-07-24 (×2): 10 meq via INTRAVENOUS
  Filled 2021-07-24 (×2): qty 100

## 2021-07-24 MED ORDER — ACETAMINOPHEN 650 MG RE SUPP
650.0000 mg | Freq: Four times a day (QID) | RECTAL | Status: DC | PRN
Start: 2021-07-24 — End: 2021-07-31

## 2021-07-24 MED ORDER — SODIUM CHLORIDE (PF) 0.9 % IJ SOLN
INTRAMUSCULAR | Status: AC
  Filled 2021-07-24: qty 50

## 2021-07-24 MED ORDER — PROCHLORPERAZINE EDISYLATE 10 MG/2ML IJ SOLN
5.0000 mg | INTRAMUSCULAR | Status: DC | PRN
  Administered 2021-07-27 – 2021-08-05 (×10): 5 mg via INTRAVENOUS
  Filled 2021-07-24 (×10): qty 2

## 2021-07-24 MED ORDER — POTASSIUM CHLORIDE IN NACL 40-0.9 MEQ/L-% IV SOLN: INTRAVENOUS | Status: DC

## 2021-07-24 MED ORDER — ONDANSETRON HCL 4 MG/2ML IJ SOLN: 4.0000 mg | Freq: Four times a day (QID) | INTRAMUSCULAR | Status: DC | PRN

## 2021-07-24 NOTE — ED Triage Notes (Signed)
Given 4mg  zofran en route

## 2021-07-24 NOTE — Procedures (Signed)
Pre Procedural Dx: Symptomatic Ascites Post Procedural Dx: Same  Successful US guided paracentesis yielding 5 L of serous ascitic fluid. Sample sent to laboratory as requested.  EBL: None  Complications: None immediate  Jay Donnia Poplaski, MD Pager #: [phone]   

## 2021-07-24 NOTE — ED Notes (Signed)
Assumed care of pt at 1415.  Pt to room 10

## 2021-07-24 NOTE — H&P (Signed)
History and Physical    Amanda Gordon DUK:[id number] DOB: 1940-11-16 DOA: 07/24/2021  PCP: System, Provider Not In   Patient coming from: Home.   I have personally briefly reviewed patient's old medical records in [place]  Chief Complaint: Abdominal distension.  HPI: Amanda Gordon is a 80 y.o. female with medical history significant of type 2 diabetes, unspecified anemia, hyperlipidemia, hypertension, osteoarthritis of the left knee, type 2 diabetes mellitus, class I obesity who is coming to the emergency department due to abdominal distention after first losing about 22 pounds in the month and then regaining 17 pounds of mostly ascitic fluid.  She has also had persistent dry heaving with significantly decreased appetite.  She has had constipation and had an episode of hematochezia several days ago.  No emesis, diarrhea or melena.  Denied flank pain, dysuria, frequency or hematuria.  No fever, night sweats but positive chills.  Denied rhinorrhea, sore throat, dyspnea, wheezing or hemoptysis.  Denied chest pain, palpitations, diaphoresis, dizziness, PND orthopnea.  She had some lower extremity edema recently.  No polyuria, polydipsia, polyphagia or blurred vision.  ED Course: Initial vital signs were temperature 98.2 F, pulse 80, respiration 18, BP 140 711 3 mmHg O2 sat 95% on room air.  Lab work: CBC showed white count 10.0, hemoglobin 14.3 g/dL platelets 477.  Lipase was normal.  CMP showed a sodium 133, potassium 3.4, chloride 90 and CO2 31 minimal/L.  Anion gap was normal.  Glucose 134, BUN 24 and creatinine was 0.76 mg/dL.  Imaging: CT scan abdomen/pelvis show moderate arthritis with mild enlarged retroperitoneal mesenteric lymph nodes.  There is mild irregular densities in the omentum which may be just inflamed but peritoneal carcinomatosis cannot be excluded.  There was sigmoid diverticulosis without inflammation, small bilateral pleural effusions, subsegmental atelectasis.   And aortic atherosclerosis.  Review of Systems: As per HPI otherwise all other systems reviewed and are negative.  Past Medical History:  Diagnosis Date   Allergy    Anemia    Complication of anesthesia    Hyperlipidemia    Hypertension    PONV (postoperative nausea and vomiting)    Primary localized osteoarthritis of left knee 07/21/2021   Type 2 diabetes mellitus ([place])    Past Surgical History:  Procedure Laterality Date   ABDOMINAL HYSTERECTOMY     APPENDECTOMY     CHOLECYSTECTOMY     Social History  reports that she has never smoked. She has never used smokeless tobacco. She reports that she does not drink alcohol and does not use drugs.  Allergies  Allergen Reactions   Codeine Nausea Only    "Dizziness"   Family History  Problem Relation Age of Onset   Heart disease Father    Hypertension Father    Stroke Father    Diabetes Brother    Heart disease Paternal Grandmother    Stroke Paternal Grandfather    Prior to Admission medications   Medication Sig Start Date End Date Taking? Authorizing Provider  acetaminophen (TYLENOL) 500 MG tablet Take 1,000 mg by mouth every 6 (six) hours as needed for mild pain.   Yes [provider]  amLODipine (NORVASC) 10 MG tablet Take 10 mg by mouth in the morning.   Yes [provider]  Aspirin-Acetaminophen (GOODYS BODY PAIN PO) Take 2 packets by mouth daily as needed (pain).   Yes [provider]  atorvastatin (LIPITOR) 40 MG tablet Take 40 mg by mouth every evening.   Yes [provider]  CALCIUM-VITAMIN D PO Take 1 tablet by mouth daily.   Yes [provider]  lisinopril (PRINIVIL,ZESTRIL) 20 MG tablet Take 20 mg by mouth in the morning.   Yes [provider]  metFORMIN (GLUCOPHAGE) 500 MG tablet Take 1,000 mg by mouth in the morning and at bedtime.   Yes [provider]  metoprolol succinate (TOPROL-XL) 100 MG 24 hr tablet Take 100 mg by mouth at bedtime. 04/02/21  Yes  [provider]  Multiple Vitamins-Minerals (CENTRUM SILVER PO) Take 1 tablet by mouth daily.   Yes [provider]  omeprazole (PRILOSEC) 20 MG capsule Take 20 mg by mouth daily as needed (indigestion (hiatal hernia flare)).   Yes [provider]   Physical Exam: Vitals:   07/24/21 1513 07/24/21 1527 07/24/21 1530 07/24/21 1612  BP: 107/83  (!) 125/114 (!) 151/107  Pulse: 89  (!) 104 (!) 102  Resp: (!) 24  (!) 22 19  Temp:      TempSrc:      SpO2: 90%  (!) 87% 90%  Weight:  79.2 kg    Height:  5\' 2"  (1.575 m)     Constitutional: Chronically ill-appearing.  NAD, calm, comfortable Eyes: PERRL, lids and conjunctivae normal ENMT: Mucous membranes and lips are dry.  Posterior pharynx clear of any exudate or lesions. Neck: normal, supple, no masses, no thyromegaly Respiratory: Decreased breath sounds in bases, otherwise clear to auscultation bilaterally, no wheezing, no crackles. Normal respiratory effort. No accessory muscle use.  Cardiovascular: Regular rate and rhythm, no murmurs / rubs / gallops. No extremity edema. 2+ pedal pulses. No carotid bruits.  Abdomen: Mildly distended.  Positive skin striae.  Bowel sounds positive.  Mild periumbilical tenderness, no guarding or rebound masses palpated. No hepatosplenomegaly.  Musculoskeletal: no clubbing / cyanosis. Good ROM, no contractures. Normal muscle tone.  Skin: Positive striae in abdominal wall skin but otherwise no acute rashes, lesions, ulcers on very limited clinical examination Neurologic: CN 2-12 grossly intact. Sensation intact, DTR normal. Strength 5/5 in all 4.  Psychiatric: Normal judgment and insight. Alert and oriented x 3. Normal mood.   Labs on Admission: I have personally reviewed following labs and imaging studies  CBC: Recent Labs  Lab 07/23/21 1429 07/24/21 1225  WBC 10.2 10.0  NEUTROABS 8.0* 8.2*  HGB 14.4 14.3  HCT 45.2 44.6  MCV 85.4 85.0  PLT 512* 477*    Basic Metabolic  Panel: Recent Labs  Lab 07/23/21 1429 07/24/21 1225  NA 137 133*  K 3.3* 3.4*  CL 94* 90*  CO2 30 31  GLUCOSE 129* 134*  BUN 22 24*  CREATININE 0.68 0.76  CALCIUM 9.5 9.3    GFR: Estimated Creatinine Clearance: 55.5 mL/min (by C-G formula based on SCr of 0.76 mg/dL).  Liver Function Tests: Recent Labs  Lab 07/23/21 1429 07/24/21 1225  AST 19 17  ALT 8 9  ALKPHOS 97 93  BILITOT 0.5 0.5  PROT 7.0 6.7  ALBUMIN 3.2* 3.3*    Urine analysis: No results found for: COLORURINE, APPEARANCEUR, LABSPEC, PHURINE, GLUCOSEU, HGBUR, BILIRUBINUR, KETONESUR, PROTEINUR, UROBILINOGEN, NITRITE, LEUKOCYTESUR  Radiological Exams on Admission: CT Abdomen Pelvis W Contrast  Result Date: 07/24/2021 CLINICAL DATA:  Abdominal distension, nausea. EXAM: CT ABDOMEN AND PELVIS WITH CONTRAST TECHNIQUE: Multidetector CT imaging of the abdomen and pelvis was performed using the standard protocol following bolus administration of intravenous contrast. CONTRAST:  50mL OMNIPAQUE IOHEXOL 350 MG/ML SOLN COMPARISON:  None. FINDINGS: Lower chest: Small bilateral pleural effusions are noted  with adjacent subsegmental atelectasis. Hepatobiliary: No focal liver abnormality is seen. Status post cholecystectomy. No biliary dilatation. Pancreas: Unremarkable. No pancreatic ductal dilatation or surrounding inflammatory changes. Spleen: Calcified splenic granulomata are noted. Adrenals/Urinary Tract: Adrenal glands are unremarkable. Kidneys are normal, without renal calculi, focal lesion, or hydronephrosis. Bladder is unremarkable. Stomach/Bowel: Stomach appears normal. There is no evidence of bowel obstruction or inflammation. Status post appendectomy. Sigmoid diverticulosis is noted without inflammation. Vascular/Lymphatic: Aortic atherosclerosis. Mildly enlarged retroperitoneal and mesenteric lymph nodes are noted which may be inflammatory in etiology, but malignancy or metastatic disease cannot be excluded. Largest lymph  node measures 13 mm in right periaortic region. Reproductive: Status post hysterectomy. 3.8 cm right ovarian cystic abnormality is noted. No definite left adnexal abnormality is noted. Other: Moderate ascites is noted. Mild irregular densities are seen involving the omentum concerning for possible peritoneal carcinomatosis. Musculoskeletal: No acute or significant osseous findings. IMPRESSION: Moderate ascites is noted. Mildly enlarged retroperitoneal mesenteric lymph nodes are noted, the largest measuring 13 mm in the right periaortic region. While these may be inflammatory in etiology, malignancy or metastatic disease cannot be excluded. Mild irregular densities are seen involving the omentum which may represent inflammation, but peritoneal carcinomatosis cannot be excluded. Status post hysterectomy. 3.8 cm right ovarian cyst is noted. Recommend follow-up US in 6-12 months. Note: This recommendation does not apply to premenarchal patients and to those with increased risk (genetic, family history, elevated tumor markers or other high-risk factors) of ovarian cancer. Reference: JACR 2020 Feb; 17(2):248-254. Sigmoid diverticulosis without inflammation. Small bilateral pleural effusions with minimal adjacent subsegmental atelectasis. Aortic Atherosclerosis (ICD10-I70.0). Electronically Signed   By: Marijo Conception M.D.   On: 07/24/2021 15:43    EKG: Independently reviewed.  Vent. rate 101 BPM PR interval 130 ms QRS duration 87 ms QT/QTcB 375/487 ms P-R-T axes 77 -22 104 Sinus tachycardia Multiple ventricular premature complexes Borderline left axis deviation Low voltage, precordial leads Borderline repolarization abnormality Borderline prolonged QT interval  Assessment/Plan Principal Problem:   Malignant ascites Observation/telemetry. Analgesics as needed. Antiemetics as needed. IR diagnostic/therapeutic paracentesis done. Further work-up pending ascitic fluid serology/cytology.  Active  Problems:   Borderline prolonged QT interval Continue cardiac telemetry. Optimize electrolytes. Avoid QT interval prolonging meds.    Hypokalemia Replacing. Mag supplementation given. Follow-up potassium level in AM.    Hyponatremia Continue NS infusion. Follow-up sodium level in AM.    Mild protein malnutrition ([place]) In the setting of abdominal ascites.    Hypertension Resume lisinopril 20 mg p.o. daily. Resume metoprolol succinate 100 mg p.o. bedtime.    Hyperlipidemia Currently not on medical therapy. Follow-up with PCP to discuss treatment options.    Type 2 diabetes mellitus (HCC) Carbohydrate modified diet. CBG monitoring with RI SS.    DVT prophylaxis: SCDs. Code Status:   Full. Family Communication:   Disposition Plan:   Patient is from:  Home.  Anticipated DC to:  Home.  Anticipated DC date:  07/25/2021 or 07/26/2021.  Anticipated DC barriers: Clinical status/pending paracentesis. Consults called:  Interventional radiology (J. Pascal Lux, MD). Admission status:  Observation/telemetry.  High severity after presenting with a history of weight loss followed by weight gain due to ascites with work-up showing retroperitoneal lymph nodes.  Severity of Illness:  Reubin Milan MD [facility]  How to contact the [facility] Acute Care Attending or Consulting provider [place] or covering provider during after hours [place], for this patient?   Check the care team in [facility] and look for a) attending/consulting [facility]  provider listed and b) the [facility] team listed Log into [email or web address] and use [place]'s universal password to access. If you do not have the password, please contact the hospital operator. Locate the [facility] provider you are looking for under [facility] and page to a number that you can be directly reached. If you still have difficulty reaching the provider, please page the [facility] (Director on Call) for the Hospitalists listed on amion for assistance.  07/24/2021, 4:47  PM   This document was prepared using Dragon voice recognition software may contain some unintended transcription errors.

## 2021-07-24 NOTE — Progress Notes (Signed)
Anesthesia Chart Review   Case: [id number] Date/Time: 08/03/21 0700   Procedure: TOTAL KNEE ARTHROPLASTY (Left: Knee)   Anesthesia type: Spinal   Pre-op diagnosis: djd left knee   Location: [facility] ROOM 07 / [facility]   Surgeons: Elsie Saas, MD       DISCUSSION:80 y.o. never smoker with h/o PONV, HTN, DM II (A1C 6.5), djd left knee scheduled for above procedure 08/03/2021 with Dr. Elsie Saas.   Anticipate pt can proceed with planned procedure barring acute status change.   VS: BP (!) 141/101   Pulse 82   Temp 36.8 C (Oral)   Ht 5' 2.5" (1.588 m)   SpO2 95%   BMI 33.08 kg/m   PROVIDERS: System, Provider Not In   LABS: Labs reviewed: Acceptable for surgery. (all labs ordered are listed, but only abnormal results are displayed)  Labs Reviewed  URINE CULTURE - Abnormal; Notable for the following components:      Result Value   Culture   (*)    Value: <10,000 COLONIES/mL INSIGNIFICANT GROWTH Performed at [facility], [address] [place], [place] [address]    All other components within normal limits  HEMOGLOBIN A1C - Abnormal; Notable for the following components:   Hgb A1c MFr Bld 6.5 (*)    All other components within normal limits  CBC WITH DIFFERENTIAL/PLATELET - Abnormal; Notable for the following components:   RBC 5.29 (*)    RDW 15.6 (*)    Platelets 512 (*)    Neutro Abs 8.0 (*)    All other components within normal limits  COMPREHENSIVE METABOLIC PANEL - Abnormal; Notable for the following components:   Potassium 3.3 (*)    Chloride 94 (*)    Glucose, Bld 129 (*)    Albumin 3.2 (*)    All other components within normal limits  GLUCOSE, CAPILLARY - Abnormal; Notable for the following components:   Glucose-Capillary 137 (*)    All other components within normal limits  SURGICAL PCR SCREEN     IMAGES:   EKG: 07/23/2021 Rate 103 bpm  Sinus tachycardia with Premature atrial complexes Left axis deviation Minimal voltage criteria for LVH, may  be normal variant ( R in aVL ) Anterolateral infarct , age undetermined Abnormal ECG No previous tracing  CV:  Past Medical History:  Diagnosis Date   Allergy    Anemia    Complication of anesthesia    Diabetes mellitus without complication (HCC)    Hyperlipidemia    Hypertension    PONV (postoperative nausea and vomiting)    Primary localized osteoarthritis of left knee 07/21/2021    Past Surgical History:  Procedure Laterality Date   ABDOMINAL HYSTERECTOMY     APPENDECTOMY     CHOLECYSTECTOMY      MEDICATIONS:  amLODipine (NORVASC) 10 MG tablet   atorvastatin (LIPITOR) 40 MG tablet   lisinopril (PRINIVIL,ZESTRIL) 20 MG tablet   metFORMIN (GLUCOPHAGE) 500 MG tablet   metoprolol succinate (TOPROL-XL) 100 MG 24 hr tablet   omeprazole (PRILOSEC) 20 MG capsule   No current facility-administered medications for this encounter.   Konrad Felix Chenoweth, PA-C [facility] [phone]

## 2021-07-24 NOTE — ED Provider Notes (Signed)
Emergency Medicine Provider Triage Evaluation Note  Amanda Gordon , a 80 y.o. female  was evaluated in triage.  Pt complains of nausea for 2 weeks.  States that she has had increasing abdominal distention and nausea over 2 weeks without inciting event.  She denies vomiting.  Initially had a few days of diarrhea however this has progressed to constipation and more difficult bowel movements.  Denies fevers, chest pain, shortness of breath, lower extremity swelling, denies urinary symptoms.  Also states that since she was recently diagnosed with diabetes she lost 22 pounds in the past month.  Review of Systems  Positive: Abdominal distention, nausea Negative: Fever, chest pain   Physical Exam  BP (!) 147/103   Pulse 80   Temp 98.2 F (36.8 C) (Oral)   Resp 18   SpO2 95%  Gen:   Awake, no distress   Resp:  Normal effort  MSK:   Moves extremities without difficulty  Other:  Abdominal exam with significant distention and mild rigidity.  She does not have rebound tenderness.  She has mild tenderness to the right upper quadrant.  States are more predominantly in the upper quadrants and the pelvis.  No abdominal bruising.  Negative Rovsing's, negative McBurney's, no CVA tenderness.  Medical Decision Making  Medically screening exam initiated at 12:01 PM.  Appropriate orders placed.  Amanda Gordon was informed that the remainder of the evaluation will be completed by another provider, this initial triage assessment does not replace that evaluation, and the importance of remaining in the ED until their evaluation is complete.     Amanda Hillier, PA-C 07/24/21 1205    Amanda Bo, MD 07/25/21 [phone]

## 2021-07-24 NOTE — ED Notes (Signed)
Placed female purewick on pt.

## 2021-07-24 NOTE — ED Provider Notes (Signed)
[facility] DEPT Provider Note   CSN: [id number] Arrival date & time: 07/24/21  1126     History Chief Complaint  Patient presents with   Weakness   Nausea    Amanda Gordon is a 80 y.o. female.  HPI She presents for evaluation of nausea and abdominal distention.  She has lost 22 pounds,last month, but in the last 20 days she has gained approximately 17 pounds.  During this time she has had decreased oral intake because she gets nauseated with eating, and decreased stooling with a feeling that she is constipated, having infrequent stools that are like "hard balls."  She was due to have knee replacement surgery, in 10 days.  Today the daughter canceled the test.  She saw GI last week who determined that he would wait for her preoperative testing to decide on the next course of treatment.  She lives alone and is having difficulty managing her ADLs, there.  She is here with her daughter who helps to give the history.  No prior history of cancer.  No fever, chills, cough or shortness of breath.    Past Medical History:  Diagnosis Date   Allergy    Anemia    Complication of anesthesia    Diabetes mellitus without complication (HCC)    Hyperlipidemia    Hypertension    PONV (postoperative nausea and vomiting)    Primary localized osteoarthritis of left knee 07/21/2021    Patient Active Problem List   Diagnosis Date Noted   Primary localized osteoarthritis of left knee 07/21/2021   Allergy 07/21/2021    Past Surgical History:  Procedure Laterality Date   ABDOMINAL HYSTERECTOMY     APPENDECTOMY     CHOLECYSTECTOMY       OB History   No obstetric history on file.     Family History  Problem Relation Age of Onset   Heart disease Father    Hypertension Father    Stroke Father    Diabetes Brother    Heart disease Paternal Grandmother    Stroke Paternal Grandfather     Social History   Tobacco Use   Smoking status: Never   Smokeless  tobacco: Never  Vaping Use   Vaping Use: Never used  Substance Use Topics   Alcohol use: No   Drug use: No    Home Medications Prior to Admission medications   Medication Sig Start Date End Date Taking? Authorizing Provider  acetaminophen (TYLENOL) 500 MG tablet Take 1,000 mg by mouth every 6 (six) hours as needed for mild pain.   Yes [provider]  amLODipine (NORVASC) 10 MG tablet Take 10 mg by mouth in the morning.   Yes [provider]  Aspirin-Acetaminophen (GOODYS BODY PAIN PO) Take 2 packets by mouth daily as needed (pain).   Yes [provider]  atorvastatin (LIPITOR) 40 MG tablet Take 40 mg by mouth every evening.   Yes [provider]  CALCIUM-VITAMIN D PO Take 1 tablet by mouth daily.   Yes [provider]  lisinopril (PRINIVIL,ZESTRIL) 20 MG tablet Take 20 mg by mouth in the morning.   Yes [provider]  metFORMIN (GLUCOPHAGE) 500 MG tablet Take 1,000 mg by mouth in the morning and at bedtime.   Yes [provider]  metoprolol succinate (TOPROL-XL) 100 MG 24 hr tablet Take 100 mg by mouth at bedtime. 04/02/21  Yes [provider]  Multiple Vitamins-Minerals (CENTRUM SILVER PO) Take 1 tablet by mouth  daily.   Yes [provider]  omeprazole (PRILOSEC) 20 MG capsule Take 20 mg by mouth daily as needed (indigestion (hiatal hernia flare)).   Yes [provider]    Allergies    Codeine  Review of Systems   Review of Systems  All other systems reviewed and are negative.  Physical Exam Updated Vital Signs BP (!) 125/114   Pulse (!) 104   Temp 98.1 F (36.7 C) (Oral)   Resp (!) 22   Ht 5\' 2"  (1.575 m)   Wt 79.2 kg   SpO2 (!) 87%   BMI 31.92 kg/m   Physical Exam Vitals and nursing note reviewed.  Constitutional:      Appearance: She is well-developed. She is not ill-appearing.  HENT:     Head: Normocephalic and atraumatic.     Right Ear: External ear normal.     Left Ear:  External ear normal.  Eyes:     General: No scleral icterus.    Conjunctiva/sclera: Conjunctivae normal.     Pupils: Pupils are equal, round, and reactive to light.  Neck:     Trachea: Phonation normal.  Cardiovascular:     Rate and Rhythm: Normal rate and regular rhythm.     Heart sounds: Normal heart sounds.  Pulmonary:     Effort: Pulmonary effort is normal.     Breath sounds: Normal breath sounds.  Abdominal:     General: There is distension.     Palpations: Abdomen is soft. There is no mass.     Tenderness: There is no abdominal tenderness.     Hernia: No hernia is present.  Musculoskeletal:        General: Normal range of motion.     Cervical back: Normal range of motion and neck supple.  Skin:    General: Skin is warm and dry.  Neurological:     Mental Status: She is alert and oriented to person, place, and time.     Cranial Nerves: No cranial nerve deficit.     Sensory: No sensory deficit.     Motor: No abnormal muscle tone.     Coordination: Coordination normal.  Psychiatric:        Mood and Affect: Mood normal.        Behavior: Behavior normal.        Thought Content: Thought content normal.        Judgment: Judgment normal.    ED Results / Procedures / Treatments   Labs (all labs ordered are listed, but only abnormal results are displayed) Labs Reviewed  COMPREHENSIVE METABOLIC PANEL - Abnormal; Notable for the following components:      Result Value   Sodium 133 (*)    Potassium 3.4 (*)    Chloride 90 (*)    Glucose, Bld 134 (*)    BUN 24 (*)    Albumin 3.3 (*)    All other components within normal limits  CBC WITH DIFFERENTIAL/PLATELET - Abnormal; Notable for the following components:   RBC 5.25 (*)    Platelets 477 (*)    Neutro Abs 8.2 (*)    All other components within normal limits  LIPASE, BLOOD  URINALYSIS, ROUTINE W REFLEX MICROSCOPIC    EKG None  Radiology CT Abdomen Pelvis W Contrast  Result Date: 07/24/2021 CLINICAL DATA:   Abdominal distension, nausea. EXAM: CT ABDOMEN AND PELVIS WITH CONTRAST TECHNIQUE: Multidetector CT imaging of the abdomen and pelvis was performed using the standard protocol following bolus administration of  intravenous contrast. CONTRAST:  51mL OMNIPAQUE IOHEXOL 350 MG/ML SOLN COMPARISON:  None. FINDINGS: Lower chest: Small bilateral pleural effusions are noted with adjacent subsegmental atelectasis. Hepatobiliary: No focal liver abnormality is seen. Status post cholecystectomy. No biliary dilatation. Pancreas: Unremarkable. No pancreatic ductal dilatation or surrounding inflammatory changes. Spleen: Calcified splenic granulomata are noted. Adrenals/Urinary Tract: Adrenal glands are unremarkable. Kidneys are normal, without renal calculi, focal lesion, or hydronephrosis. Bladder is unremarkable. Stomach/Bowel: Stomach appears normal. There is no evidence of bowel obstruction or inflammation. Status post appendectomy. Sigmoid diverticulosis is noted without inflammation. Vascular/Lymphatic: Aortic atherosclerosis. Mildly enlarged retroperitoneal and mesenteric lymph nodes are noted which may be inflammatory in etiology, but malignancy or metastatic disease cannot be excluded. Largest lymph node measures 13 mm in right periaortic region. Reproductive: Status post hysterectomy. 3.8 cm right ovarian cystic abnormality is noted. No definite left adnexal abnormality is noted. Other: Moderate ascites is noted. Mild irregular densities are seen involving the omentum concerning for possible peritoneal carcinomatosis. Musculoskeletal: No acute or significant osseous findings. IMPRESSION: Moderate ascites is noted. Mildly enlarged retroperitoneal mesenteric lymph nodes are noted, the largest measuring 13 mm in the right periaortic region. While these may be inflammatory in etiology, malignancy or metastatic disease cannot be excluded. Mild irregular densities are seen involving the omentum which may represent inflammation,  but peritoneal carcinomatosis cannot be excluded. Status post hysterectomy. 3.8 cm right ovarian cyst is noted. Recommend follow-up US in 6-12 months. Note: This recommendation does not apply to premenarchal patients and to those with increased risk (genetic, family history, elevated tumor markers or other high-risk factors) of ovarian cancer. Reference: JACR 2020 Feb; 17(2):248-254. Sigmoid diverticulosis without inflammation. Small bilateral pleural effusions with minimal adjacent subsegmental atelectasis. Aortic Atherosclerosis (ICD10-I70.0). Electronically Signed   By: Marijo Conception M.D.   On: 07/24/2021 15:43    Procedures Procedures   Medications Ordered in ED Medications  sodium chloride (PF) 0.9 % injection (has no administration in time range)  iohexol (OMNIPAQUE) 350 MG/ML injection 80 mL (80 mLs Intravenous Contrast Given 07/24/21 1332)  iohexol (OMNIPAQUE) 350 MG/ML injection 75 mL (75 mLs Intravenous Contrast Given 07/24/21 1451)    ED Course  I have reviewed the triage vital signs and the nursing notes.  Pertinent labs & imaging results that were available during my care of the patient were reviewed by me and considered in my medical decision making (see chart for details).    MDM Rules/Calculators/A&P                            Patient Vitals for the past 24 hrs:  BP Temp Temp src Pulse Resp SpO2 Height Weight  07/24/21 1530 (!) 125/114 -- -- (!) 104 (!) 22 (!) 87 % -- --  07/24/21 1527 -- -- -- -- -- -- 5\' 2"  (1.575 m) 79.2 kg  07/24/21 1513 107/83 -- -- 89 (!) 24 90 % -- --  07/24/21 1430 (!) 140/110 -- -- (!) 106 19 92 % -- --  07/24/21 1414 (!) 145/106 98.1 F (36.7 C) Oral 100 20 91 % -- --  07/24/21 1139 -- 98.2 F (36.8 C) Oral 80 18 95 % -- --  07/24/21 1137 (!) 147/103 -- -- -- -- -- -- --    4:17 PM Reevaluation with update and discussion. After initial assessment and treatment, an updated evaluation reveals resting comfortably in bed.  Findings discussed  with patient and daughter at bedside, questions answered. Vira Agar  [facility] ([place])   Medical Decision Making:  This patient is presenting for evaluation of weight gain, abdominal swelling, weakness, and decreased oral intake, which does require a range of treatment options, and is a complaint that involves a high risk of morbidity and mortality. The differential diagnoses include ascites, bowel blockage, malignancy. I decided to review old records, and in summary elderly female with rapid weight loss followed by rapid weight gain, without prior history of cancer.  I obtained additional historical information from daughter at bedside.  Clinical Laboratory Tests Ordered, included CBC, Metabolic panel, and lipase . Review indicates normal except sodium low, potassium low, chloride low, glucose high, BUN high, platelets high. Radiologic Tests Ordered, included CT abdomen pelvis.  I independently Visualized: CT images, which show ascites with lymphadenopathy and probable carcinomatosis.  Likely incidental right ovarian cyst.    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  Patient is gaining about a pound of weight daily, and likely will need diagnostic and therapeutic paracentesis.  She meets criteria for admission for evaluation and management of rapidly progressive cancer syndrome.  CRITICAL CARE-no Performed by: Daleen Bo  Nursing Notes Reviewed/ Care Coordinated Applicable Imaging Reviewed Interpretation of Laboratory Data incorporated into ED treatment  4:18 PM-Consult complete with Hospitalist. Patient case explained and discussed. He agrees to admit patient for further evaluation and treatment. Call ended at  4:25 Pm     Final Clinical Impression(s) / ED Diagnoses Final diagnoses:  Malignant  ascites    Rx / DC Orders ED Discharge Orders     None        Daleen Bo, MD 07/24/21 1625

## 2021-07-24 NOTE — ED Notes (Signed)
ED TO INPATIENT HANDOFF REPORT  Name/Age/Gender Amanda Gordon 80 y.o. female  Code Status    Code Status Orders  (From admission, onward)           Start     Ordered   07/24/21 1637  Full code  Continuous        07/24/21 1640           Code Status History     This patient has a current code status but no historical code status.       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]  Admitting Physician: Reubin Milan [[id number]]  Attending Physician: Reubin Milan [[id number]]          Medical History Past Medical History:  Diagnosis Date   Allergy    Anemia    Complication of anesthesia    Hyperlipidemia    Hypertension    PONV (postoperative nausea and vomiting)    Primary localized osteoarthritis of left knee 07/21/2021   Type 2 diabetes mellitus (HCC)     Allergies Allergies  Allergen Reactions   Codeine Nausea Only    "Dizziness"    IV Location/Drains/Wounds Patient Lines/Drains/Airways Status     Active Line/Drains/Airways     Name Placement date Placement time Site Days   Peripheral IV 07/24/21 20 G Anterior;Proximal;Right Forearm 07/24/21  1431  Forearm  less than 1            Labs/Imaging Results for orders placed or performed during the hospital encounter of 07/24/21 (from the past 48 hour(s))  Comprehensive metabolic panel     Status: Abnormal   Collection Time: 07/24/21 12:25 PM  Result Value Ref Range   Sodium 133  (L) 135 - 145 mmol/L   Potassium 3.4 (L) 3.5 - 5.1 mmol/L   Chloride 90 (L) 98 - 111 mmol/L   CO2 31 22 - 32 mmol/L   Glucose, Bld 134 (H) 70 - 99 mg/dL    Comment: Glucose reference range applies only to samples taken after fasting for at least 8 hours.   BUN 24 (H) 8 - 23 mg/dL   Creatinine, Ser 0.76 0.44 - 1.00 mg/dL   Calcium 9.3 8.9 - 10.3 mg/dL   Total Protein 6.7 6.5 - 8.1 g/dL   Albumin 3.3 (L) 3.5 - 5.0 g/dL   AST 17 15 - 41 U/L   ALT 9 0 - 44 U/L   Alkaline Phosphatase 93 38 - 126 U/L   Total Bilirubin 0.5 0.3 - 1.2 mg/dL   GFR, Estimated >60 >60 mL/min    Comment: (NOTE) Calculated using the CKD-EPI Creatinine Equation (2021)    Anion gap 12 5 - 15    Comment: Performed at [facility], [place] [address] [place], [place] [address]  Lipase, blood     Status: None   Collection Time: 07/24/21 12:25 PM  Result Value Ref Range   Lipase 18 11 - 51 U/L    Comment: Performed at [facility],  [place] [address], [place], [place] [address]  CBC with Differential     Status: Abnormal   Collection Time: 07/24/21 12:25 PM  Result Value Ref Range   WBC 10.0 4.0 - 10.5 K/uL   RBC 5.25 (H) 3.87 - 5.11 MIL/uL   Hemoglobin 14.3 12.0 - 15.0 g/dL   HCT 44.6 36.0 - 46.0 %   MCV 85.0 80.0 - 100.0 fL   MCH 27.2 26.0 - 34.0 pg   MCHC 32.1 30.0 - 36.0 g/dL   RDW 15.3 11.5 - 15.5 %   Platelets 477 (H) 150 - 400 K/uL   nRBC 0.0 0.0 - 0.2 %   Neutrophils Relative % 81 %   Neutro Abs 8.2 (H) 1.7 - 7.7 K/uL   Lymphocytes Relative 9 %   Lymphs Abs 0.9 0.7 - 4.0 K/uL   Monocytes Relative 8 %   Monocytes Absolute 0.8 0.1 - 1.0 K/uL   Eosinophils Relative 1 %   Eosinophils Absolute 0.1 0.0 - 0.5 K/uL   Basophils Relative 1 %   Basophils Absolute 0.1 0.0 - 0.1 K/uL   Immature Granulocytes 0 %   Abs Immature Granulocytes 0.03 0.00 - 0.07 K/uL    Comment: Performed at [facility], [place] [address] [place], [place] [address]  Resp Panel by  RT-PCR (Flu A&B, Covid) Nasopharyngeal Swab     Status: None   Collection Time: 07/24/21  4:16 PM   Specimen: Nasopharyngeal Swab; Nasopharyngeal(NP) swabs in vial transport medium  Result Value Ref Range   SARS Coronavirus 2 by RT PCR NEGATIVE NEGATIVE    Comment: (NOTE) SARS-CoV-2 target nucleic acids are NOT DETECTED.  The SARS-CoV-2 RNA is generally detectable in upper respiratory specimens during the acute phase of infection. The lowest concentration of SARS-CoV-2 viral copies this assay can detect is 138 copies/mL. A negative result does not preclude SARS-Cov-2 infection and should not be used as the sole basis for treatment or other patient management decisions. A negative result may occur with  improper specimen collection/handling, submission of specimen other than nasopharyngeal swab, presence of viral mutation(s) within the areas targeted by this assay, and inadequate number of viral copies(<138 copies/mL). A negative result must be combined with clinical observations, patient history, and epidemiological information. The expected result is Negative.  Fact Sheet for Patients:  EntrepreneurPulse.com.au  Fact Sheet for Healthcare Providers:  IncredibleEmployment.be  This test is no t yet approved or cleared by the [place] FDA and  has been authorized for detection and/or diagnosis of SARS-CoV-2 by FDA under an Emergency Use Authorization (EUA). This EUA will remain  in effect (meaning this test can be used) for the duration of the COVID-19 declaration under Section 564(b)(1) of the Act, 21 U.S.C.section 360bbb-3(b)(1), unless the authorization is terminated  or revoked sooner.       Influenza A by PCR NEGATIVE NEGATIVE   Influenza B by PCR NEGATIVE NEGATIVE    Comment: (NOTE) The Xpert Xpress SARS-CoV-2/FLU/RSV plus assay is intended as an aid in the diagnosis of influenza from Nasopharyngeal swab specimens and should not be  used as a sole basis for treatment. Nasal washings and aspirates are unacceptable for Xpert Xpress SARS-CoV-2/FLU/RSV testing.  Fact Sheet for Patients: EntrepreneurPulse.com.au  Fact Sheet for Healthcare Providers: IncredibleEmployment.be  This test is not yet approved or cleared by the [place] FDA and has been authorized for detection and/or diagnosis of SARS-CoV-2 by FDA under an Emergency Use Authorization (EUA). This EUA will remain in effect (meaning  this test can be used) for the duration of the COVID-19 declaration under Section 564(b)(1) of the Act, 21 U.S.C. section 360bbb-3(b)(1), unless the authorization is terminated or revoked.  Performed at [facility], [place] [address] [place], [place] [address]   Magnesium     Status: None   Collection Time: 07/24/21  4:40 PM  Result Value Ref Range   Magnesium 1.9 1.7 - 2.4 mg/dL    Comment: Performed at [facility], [place] [address] [place], [place] [address]  Phosphorus     Status: None   Collection Time: 07/24/21  4:40 PM  Result Value Ref Range   Phosphorus 4.0 2.5 - 4.6 mg/dL    Comment: Performed at [facility], [place] [address] [place], [place] [address]  Lactate dehydrogenase (pleural or peritoneal fluid)     Status: Abnormal   Collection Time: 07/24/21  5:34 PM  Result Value Ref Range   LD, Fluid 323 (H) 3 - 23 U/L    Comment: (NOTE) Results should be evaluated in conjunction with serum values    Fluid Type-FLDH CYTO PERI     Comment: Performed at [facility], [place] [address] [place], [place] [address]  Body fluid cell count with differential     Status: Abnormal   Collection Time: 07/24/21  5:34 PM  Result Value Ref Range   Fluid Type-FCT CYTO PERI    Color, Fluid YELLOW YELLOW   Appearance, Fluid CLOUDY (A) CLEAR   Total Nucleated Cell Count, Fluid 489 0 - 1,000 cu mm   Neutrophil Count,  Fluid 8 0 - 25 %   Lymphs, Fluid 31 %   Monocyte-Macrophage-Serous Fluid 61 50 - 90 %   Eos, Fluid 0 %   Other Cells, Fluid OTHER CELLS UNIDENTIFIED; SEE CYTOLOGY REPORT %    Comment: Performed at [facility], [place] [address], [place], [place] [address]  Albumin, pleural or peritoneal fluid      Status: None   Collection Time: 07/24/21  5:34 PM  Result Value Ref Range   Albumin, Fluid 2.7 g/dL    Comment: (NOTE) No normal range established for this test Results should be evaluated in conjunction with serum values    Fluid Type-FALB CYTO PERI     Comment: Performed at [facility], [place] [address] [place], [place] [address]  Protein, pleural or peritoneal fluid     Status: None   Collection Time: 07/24/21  5:34 PM  Result Value Ref Range   Total protein, fluid 4.2 g/dL    Comment: (NOTE) No normal range established for this test Results should be evaluated in conjunction with serum values    Fluid Type-FTP CYTO PERI     Comment: Performed at [facility], [place] [address] [place], [place] [address]  Glucose, pleural or peritoneal fluid     Status: None   Collection Time: 07/24/21  5:34 PM  Result Value Ref Range   Glucose, Fluid 66 mg/dL    Comment: (NOTE) No normal range established for this test Results should be evaluated in conjunction with serum values    Fluid Type-FGLU CYTO PERI     Comment: Performed at [facility], [place] [address], [place], [place] [address]   CT Abdomen Pelvis W Contrast  Result Date: 07/24/2021 CLINICAL DATA:  Abdominal distension, nausea. EXAM: CT ABDOMEN AND PELVIS WITH CONTRAST TECHNIQUE: Multidetector CT imaging of the abdomen and pelvis was performed using  the standard protocol following bolus administration of intravenous contrast. CONTRAST:  39mL OMNIPAQUE IOHEXOL 350 MG/ML SOLN COMPARISON:  None. FINDINGS: Lower chest: Small bilateral pleural effusions are noted with  adjacent subsegmental atelectasis. Hepatobiliary: No focal liver abnormality is seen. Status post cholecystectomy. No biliary dilatation. Pancreas: Unremarkable. No pancreatic ductal dilatation or surrounding inflammatory changes. Spleen: Calcified splenic granulomata are noted. Adrenals/Urinary Tract: Adrenal glands are unremarkable. Kidneys are normal, without renal calculi, focal lesion, or hydronephrosis. Bladder is unremarkable. Stomach/Bowel: Stomach appears normal. There is no evidence of bowel obstruction or inflammation. Status post appendectomy. Sigmoid diverticulosis is noted without inflammation. Vascular/Lymphatic: Aortic atherosclerosis. Mildly enlarged retroperitoneal and mesenteric lymph nodes are noted which may be inflammatory in etiology, but malignancy or metastatic disease cannot be excluded. Largest lymph node measures 13 mm in right periaortic region. Reproductive: Status post hysterectomy. 3.8 cm right ovarian cystic abnormality is noted. No definite left adnexal abnormality is noted. Other: Moderate ascites is noted. Mild irregular densities are seen involving the omentum concerning for possible peritoneal carcinomatosis. Musculoskeletal: No acute or significant osseous findings. IMPRESSION: Moderate ascites is noted. Mildly enlarged retroperitoneal mesenteric lymph nodes are noted, the largest measuring 13 mm in the right periaortic region. While these may be inflammatory in etiology, malignancy or metastatic disease cannot be excluded. Mild irregular densities are seen involving the omentum which may represent inflammation, but peritoneal carcinomatosis cannot be excluded. Status post hysterectomy. 3.8 cm right ovarian cyst is noted. Recommend follow-up US in 6-12 months. Note: This recommendation does not apply to premenarchal patients and to those with increased risk (genetic, family history, elevated tumor markers or other high-risk factors) of ovarian cancer. Reference: JACR 2020 Feb;  17(2):248-254. Sigmoid diverticulosis without inflammation. Small bilateral pleural effusions with minimal adjacent subsegmental atelectasis. Aortic Atherosclerosis (ICD10-I70.0). Electronically Signed   By: Marijo Conception M.D.   On: 07/24/2021 15:43    Pending Labs Unresulted Labs (From admission, onward)     Start     Ordered   07/25/21 0500  CBC  Tomorrow morning,   R        07/24/21 1640   07/25/21 0500  Comprehensive metabolic panel  Tomorrow morning,   R        07/24/21 1640   07/24/21 1735  Amylase, pleural or peritoneal fluid     RELEASE UPON ORDERING,   STAT        07/24/21 1735   07/24/21 1734  PH, Body Fluid  Once,   R        07/24/21 1734   07/24/21 1202  Urinalysis, Routine w reflex microscopic Urine, Clean Catch  Once,   STAT        07/24/21 1201            Vitals/Pain Today's Vitals   07/24/21 1749 07/24/21 1930 07/24/21 2130 07/24/21 2200  BP:  124/87 117/83 108/75  Pulse:  94 98 96  Resp:  15 (!) 23 20  Temp:      TempSrc:      SpO2:  96% 91% 92%  Weight:      Height:      PainSc: 0-No pain       Isolation Precautions No active isolations  Medications Medications  acetaminophen (TYLENOL) tablet 650 mg (650 mg Oral Given 07/24/21 2253)    Or  acetaminophen (TYLENOL) suppository 650 mg ( Rectal See Alternative 07/24/21 2253)  prochlorperazine (COMPAZINE) injection 5 mg (has no administration in time range)  0.9 % NaCl with KCl 40  mEq / L  infusion ( Intravenous Not Given 07/24/21 1852)  amLODipine (NORVASC) tablet 10 mg (has no administration in time range)  metoprolol succinate (TOPROL-XL) 24 hr tablet 100 mg (100 mg Oral Given 07/24/21 2253)  lisinopril (ZESTRIL) tablet 20 mg (has no administration in time range)  pantoprazole (PROTONIX) EC tablet 40 mg (has no administration in time range)  iohexol (OMNIPAQUE) 350 MG/ML injection 80 mL (80 mLs Intravenous Contrast Given 07/24/21 1332)  iohexol (OMNIPAQUE) 350 MG/ML injection 75 mL (75 mLs Intravenous  Contrast Given 07/24/21 1451)  sodium chloride (PF) 0.9 % injection (  Given 07/24/21 1709)  lidocaine-EPINEPHrine (XYLOCAINE W/EPI) 2 %-1:200000 (PF) injection (6 mLs  Given 07/24/21 1700)  potassium chloride 10 mEq in 100 mL IVPB (0 mEq Intravenous Stopped 07/24/21 2115)  magnesium sulfate IVPB 2 g 50 mL (0 g Intravenous Stopped 07/24/21 1844)    Mobility walks with person assist

## 2021-07-25 DIAGNOSIS — R180 Malignant ascites: Secondary | ICD-10-CM | POA: Diagnosis not present

## 2021-07-25 LAB — CBC
HCT: 36.4 % (ref 36.0–46.0)
Hemoglobin: 11.9 g/dL — ABNORMAL LOW (ref 12.0–15.0)
MCH: 27.5 pg (ref 26.0–34.0)
MCHC: 32.7 g/dL (ref 30.0–36.0)
MCV: 84.3 fL (ref 80.0–100.0)
Platelets: 349 10*3/uL (ref 150–400)
RBC: 4.32 MIL/uL (ref 3.87–5.11)
RDW: 15.2 % (ref 11.5–15.5)
WBC: 7.9 10*3/uL (ref 4.0–10.5)
nRBC: 0 % (ref 0.0–0.2)

## 2021-07-25 LAB — AMYLASE, PLEURAL OR PERITONEAL FLUID: Amylase, Fluid: 11 U/L

## 2021-07-25 LAB — COMPREHENSIVE METABOLIC PANEL
ALT: 8 U/L (ref 0–44)
AST: 15 U/L (ref 15–41)
Albumin: 2.5 g/dL — ABNORMAL LOW (ref 3.5–5.0)
Alkaline Phosphatase: 72 U/L (ref 38–126)
Anion gap: 12 (ref 5–15)
BUN: 18 mg/dL (ref 8–23)
CO2: 30 mmol/L (ref 22–32)
Calcium: 8.6 mg/dL — ABNORMAL LOW (ref 8.9–10.3)
Chloride: 98 mmol/L (ref 98–111)
Creatinine, Ser: 0.66 mg/dL (ref 0.44–1.00)
GFR, Estimated: >60 mL/min (ref >60)
Glucose, Bld: 89 mg/dL (ref 70–99)
Potassium: 3.2 mmol/L — ABNORMAL LOW (ref 3.5–5.1)
Sodium: 140 mmol/L (ref 135–145)
Total Bilirubin: 0.6 mg/dL (ref 0.3–1.2)
Total Protein: 5.1 g/dL — ABNORMAL LOW (ref 6.5–8.1)

## 2021-07-25 MED ORDER — ORAL CARE MOUTH RINSE
15.0000 mL | Freq: Two times a day (BID) | OROMUCOSAL | Status: DC
  Administered 2021-07-27 – 2021-08-02 (×12): 15 mL via OROMUCOSAL

## 2021-07-25 MED ORDER — BISACODYL 5 MG PO TBEC
5.0000 mg | DELAYED_RELEASE_TABLET | Freq: Every day | ORAL | Status: DC | PRN
  Administered 2021-07-25: 5 mg via ORAL
  Filled 2021-07-25: qty 1

## 2021-07-25 MED ORDER — CHLORHEXIDINE GLUCONATE 0.12 % MT SOLN
15.0000 mL | Freq: Two times a day (BID) | OROMUCOSAL | Status: DC
  Administered 2021-07-25 – 2021-08-02 (×9): 15 mL via OROMUCOSAL
  Filled 2021-07-25 (×14): qty 15

## 2021-07-25 NOTE — Plan of Care (Signed)

## 2021-07-25 NOTE — Progress Notes (Signed)
PROGRESS NOTE    Amanda Gordon  JHE:[id number] DOB: 1940/11/17 DOA: 07/24/2021 PCP: System, Provider Not In   Brief Narrative:  Amanda Gordon is a 80 y.o. female with medical history significant of type 2 diabetes, unspecified anemia, hyperlipidemia, hypertension, osteoarthritis of the left knee, type 2 diabetes mellitus, class I obesity who is coming to the emergency department due to abdominal distention after first losing about 22 pounds in the month and then regaining 17 pounds with profound abdominal distention.  Assessment & Plan:   Malignant ascites Of unclear etiology - 5L withdrawn - labs pending SAAG not consistent with portal HTN IR diagnostic/therapeutic paracentesis done - without complications - moderate drainage ongoing from drainage site Further work-up pending ascitic fluid cytology  Borderline prolonged QT interval Continue cardiac telemetry. Optimize electrolytes. Avoid QT interval prolonging meds.   Hypokalemia Repleted this am - follow repeat labs.   Hyponatremia, hypovolemic, resolving WNL   Mild protein malnutrition ([place]) In the setting of abdominal ascites.   Hypertension Resume lisinopril 20 mg p.o. daily. Resume metoprolol succinate 100 mg p.o. bedtime.   Hyperlipidemia Currently not on medical therapy. Follow-up with PCP to discuss treatment options.   Type 2 diabetes mellitus (HCC) Carbohydrate modified diet. CBG monitoring with RI SS.  DVT prophylaxis: SCDs. Code Status: Full. Family Communication: None present  Status is: Inpt  Dispo: The patient is from: Home              Anticipated d/c is to: Home              Anticipated d/c date is: 24-48h              Patient currently not medically stable for discharge  Consultants:  None  Procedures:  Paracentesis 07/24/21  Antimicrobials:  None   Subjective: No acute issues/events overnight  Objective: Vitals:   07/25/21 0004 07/25/21 0011 07/25/21 0421 07/25/21 0746  BP:  (!) 137/95  113/72 119/66  Pulse: 92  66 62  Resp: 18  18   Temp: 98 F (36.7 C)  98.3 F (36.8 C) 98.3 F (36.8 C)  TempSrc: Oral  Oral Oral  SpO2: 92%  94% 93%  Weight:  75.1 kg    Height:  5\' 2"  (1.575 m)      Intake/Output Summary (Last 24 hours) at 07/25/2021 0819 Last data filed at 07/25/2021 0015 Gross per 24 hour  Intake 250 ml  Output 1 ml  Net 249 ml   Filed Weights   07/24/21 1527 07/25/21 0011  Weight: 79.2 kg 75.1 kg    Examination:  General exam: Appears calm and comfortable  Respiratory system: Clear to auscultation. Respiratory effort normal. Cardiovascular system: S1 & S2 heard, RRR. No JVD, murmurs, rubs, gallops or clicks. No pedal edema. Gastrointestinal system: Abdomen is minimally distended Central nervous system: Alert and oriented. No focal neurological deficits. Extremities: Symmetric 5 x 5 power. Skin: No rashes, lesions or ulcers Psychiatry: Judgement and insight appear normal. Mood & affect appropriate.   Data Reviewed: I have personally reviewed following labs and imaging studies  CBC: Recent Labs  Lab 07/23/21 1429 07/24/21 1225 07/25/21 0347  WBC 10.2 10.0 7.9  NEUTROABS 8.0* 8.2*  --   HGB 14.4 14.3 11.9*  HCT 45.2 44.6 36.4  MCV 85.4 85.0 84.3  PLT 512* 477* 185   Basic Metabolic Panel: Recent Labs  Lab 07/23/21 1429 07/24/21 1225 07/24/21 1640 07/25/21 0347  NA 137 133*  --  140  K 3.3*  3.4*  --  3.2*  CL 94* 90*  --  98  CO2 30 31  --  30  GLUCOSE 129* 134*  --  89  BUN 22 24*  --  18  CREATININE 0.68 0.76  --  0.66  CALCIUM 9.5 9.3  --  8.6*  MG  --   --  1.9  --   PHOS  --   --  4.0  --    GFR: Estimated Creatinine Clearance: 54.1 mL/min (by C-G formula based on SCr of 0.66 mg/dL). Liver Function Tests: Recent Labs  Lab 07/23/21 1429 07/24/21 1225 07/25/21 0347  AST 19 17 15   ALT 8 9 8   ALKPHOS 97 93 72  BILITOT 0.5 0.5 0.6  PROT 7.0 6.7 5.1*  ALBUMIN 3.2* 3.3* 2.5*   Recent Labs  Lab  07/24/21 1225  LIPASE 18   No results for input(s): AMMONIA in the last 168 hours. Coagulation Profile: No results for input(s): INR, PROTIME in the last 168 hours. Cardiac Enzymes: No results for input(s): CKTOTAL, CKMB, CKMBINDEX, TROPONINI in the last 168 hours. BNP (last 3 results) No results for input(s): PROBNP in the last 8760 hours. HbA1C: Recent Labs    07/23/21 1429  HGBA1C 6.5*   CBG: Recent Labs  Lab 07/23/21 1359  GLUCAP 137*   Lipid Profile: No results for input(s): CHOL, HDL, LDLCALC, TRIG, CHOLHDL, LDLDIRECT in the last 72 hours. Thyroid Function Tests: No results for input(s): TSH, T4TOTAL, FREET4, T3FREE, THYROIDAB in the last 72 hours. Anemia Panel: No results for input(s): VITAMINB12, FOLATE, FERRITIN, TIBC, IRON, RETICCTPCT in the last 72 hours. Sepsis Labs: No results for input(s): PROCALCITON, LATICACIDVEN in the last 168 hours.  Recent Results (from the past 240 hour(s))  Surgical pcr screen     Status: None   Collection Time: 07/23/21  2:29 PM   Specimen: Nasal Mucosa; Nasal Swab  Result Value Ref Range Status   MRSA, PCR NEGATIVE NEGATIVE Final   Staphylococcus aureus NEGATIVE NEGATIVE Final    Comment: (NOTE) The Xpert SA Assay (FDA approved for NASAL specimens in patients 43 years of age and older), is one component of a comprehensive surveillance program. It is not intended to diagnose infection nor to guide or monitor treatment. Performed at [facility], [place] [address] [place], [place] [address]   Urine Culture     Status: Abnormal   Collection Time: 07/23/21  2:29 PM   Specimen: Urine, Clean Catch  Result Value Ref Range Status   Specimen Description   Final    URINE, CLEAN CATCH Performed at [facility], [place] [address] [place], [place] [address]    Special Requests   Final    NONE Performed at [facility], [place] [address], [place], [place] [address]    Culture (A)   Final    <10,000 COLONIES/mL INSIGNIFICANT GROWTH Performed at [facility] [address] [place], [place] [address]    Report Status 07/24/2021 FINAL  Final  Resp Panel by RT-PCR (Flu A&B, Covid) Nasopharyngeal Swab     Status: None   Collection Time: 07/24/21  4:16 PM   Specimen: Nasopharyngeal Swab; Nasopharyngeal(NP) swabs in vial transport medium  Result Value Ref Range Status   SARS Coronavirus 2 by RT PCR NEGATIVE NEGATIVE Final    Comment: (NOTE) SARS-CoV-2 target nucleic acids are NOT DETECTED.  The SARS-CoV-2 RNA is generally detectable in upper respiratory specimens during the acute phase of infection.  The lowest concentration of SARS-CoV-2 viral copies this assay can detect is 138 copies/mL. A negative result does not preclude SARS-Cov-2 infection and should not be used as the sole basis for treatment or other patient management decisions. A negative result may occur with  improper specimen collection/handling, submission of specimen other than nasopharyngeal swab, presence of viral mutation(s) within the areas targeted by this assay, and inadequate number of viral copies(<138 copies/mL). A negative result must be combined with clinical observations, patient history, and epidemiological information. The expected result is Negative.  Fact Sheet for Patients:  EntrepreneurPulse.com.au  Fact Sheet for Healthcare Providers:  IncredibleEmployment.be  This test is no t yet approved or cleared by the [place] FDA and  has been authorized for detection and/or diagnosis of SARS-CoV-2 by FDA under an Emergency Use Authorization (EUA). This EUA will remain  in effect (meaning this test can be used) for the duration of the COVID-19 declaration under Section 564(b)(1) of the Act, 21 U.S.C.section 360bbb-3(b)(1), unless the authorization is terminated  or revoked sooner.       Influenza A by PCR NEGATIVE NEGATIVE Final    Influenza B by PCR NEGATIVE NEGATIVE Final    Comment: (NOTE) The Xpert Xpress SARS-CoV-2/FLU/RSV plus assay is intended as an aid in the diagnosis of influenza from Nasopharyngeal swab specimens and should not be used as a sole basis for treatment. Nasal washings and aspirates are unacceptable for Xpert Xpress SARS-CoV-2/FLU/RSV testing.  Fact Sheet for Patients: EntrepreneurPulse.com.au  Fact Sheet for Healthcare Providers: IncredibleEmployment.be  This test is not yet approved or cleared by the [place] FDA and has been authorized for detection and/or diagnosis of SARS-CoV-2 by FDA under an Emergency Use Authorization (EUA). This EUA will remain in effect (meaning this test can be used) for the duration of the COVID-19 declaration under Section 564(b)(1) of the Act, 21 U.S.C. section 360bbb-3(b)(1), unless the authorization is terminated or revoked.  Performed at [facility], [place] [address], [place], [place] [address]          Radiology Studies: CT Abdomen Pelvis W Contrast  Result Date: 07/24/2021 CLINICAL DATA:  Abdominal distension, nausea. EXAM: CT ABDOMEN AND PELVIS WITH CONTRAST TECHNIQUE: Multidetector CT imaging of the abdomen and pelvis was performed using the standard protocol following bolus administration of intravenous contrast. CONTRAST:  31mL OMNIPAQUE IOHEXOL 350 MG/ML SOLN COMPARISON:  None. FINDINGS: Lower chest: Small bilateral pleural effusions are noted with adjacent subsegmental atelectasis. Hepatobiliary: No focal liver abnormality is seen. Status post cholecystectomy. No biliary dilatation. Pancreas: Unremarkable. No pancreatic ductal dilatation or surrounding inflammatory changes. Spleen: Calcified splenic granulomata are noted. Adrenals/Urinary Tract: Adrenal glands are unremarkable. Kidneys are normal, without renal calculi, focal lesion, or hydronephrosis. Bladder is unremarkable.  Stomach/Bowel: Stomach appears normal. There is no evidence of bowel obstruction or inflammation. Status post appendectomy. Sigmoid diverticulosis is noted without inflammation. Vascular/Lymphatic: Aortic atherosclerosis. Mildly enlarged retroperitoneal and mesenteric lymph nodes are noted which may be inflammatory in etiology, but malignancy or metastatic disease cannot be excluded. Largest lymph node measures 13 mm in right periaortic region. Reproductive: Status post hysterectomy. 3.8 cm right ovarian cystic abnormality is noted. No definite left adnexal abnormality is noted. Other: Moderate ascites is noted. Mild irregular densities are seen involving the omentum concerning for possible peritoneal carcinomatosis. Musculoskeletal: No acute or significant osseous findings. IMPRESSION: Moderate ascites is noted. Mildly enlarged retroperitoneal mesenteric lymph nodes are noted, the largest measuring 13 mm in the right periaortic region. While these may be  inflammatory in etiology, malignancy or metastatic disease cannot be excluded. Mild irregular densities are seen involving the omentum which may represent inflammation, but peritoneal carcinomatosis cannot be excluded. Status post hysterectomy. 3.8 cm right ovarian cyst is noted. Recommend follow-up US in 6-12 months. Note: This recommendation does not apply to premenarchal patients and to those with increased risk (genetic, family history, elevated tumor markers or other high-risk factors) of ovarian cancer. Reference: JACR 2020 Feb; 17(2):248-254. Sigmoid diverticulosis without inflammation. Small bilateral pleural effusions with minimal adjacent subsegmental atelectasis. Aortic Atherosclerosis (ICD10-I70.0). Electronically Signed   By: Marijo Conception M.D.   On: 07/24/2021 15:43    Scheduled Meds:  amLODipine  10 mg Oral Daily   chlorhexidine  15 mL Mouth Rinse BID   lisinopril  20 mg Oral Daily   mouth rinse  15 mL Mouth Rinse q12n4p   metoprolol  succinate  100 mg Oral QHS   pantoprazole  40 mg Oral Daily   Continuous Infusions:  0.9 % NaCl with KCl 40 mEq / L       LOS: 0 days   Time spent: 11min  Assyria Morreale C Colon Rueth, DO [facility]  If 7PM-7AM, please contact night-coverage [email or web address]  07/25/2021, 8:19 AM

## 2021-07-25 NOTE — Plan of Care (Signed)

## 2021-07-25 NOTE — Progress Notes (Signed)
Replaced saturated dressing to left lateral lower abdominal area with a drainage bag system.

## 2021-07-26 ENCOUNTER — Observation Stay (HOSPITAL_COMMUNITY): Payer: Medicare Other

## 2021-07-26 DIAGNOSIS — E785 Hyperlipidemia, unspecified: Secondary | ICD-10-CM | POA: Diagnosis present

## 2021-07-26 DIAGNOSIS — R180 Malignant ascites: Secondary | ICD-10-CM | POA: Diagnosis present

## 2021-07-26 DIAGNOSIS — Z9049 Acquired absence of other specified parts of digestive tract: Secondary | ICD-10-CM | POA: Diagnosis not present

## 2021-07-26 DIAGNOSIS — Z833 Family history of diabetes mellitus: Secondary | ICD-10-CM | POA: Diagnosis not present

## 2021-07-26 DIAGNOSIS — N83291 Other ovarian cyst, right side: Secondary | ICD-10-CM | POA: Diagnosis present

## 2021-07-26 DIAGNOSIS — D630 Anemia in neoplastic disease: Secondary | ICD-10-CM | POA: Diagnosis not present

## 2021-07-26 DIAGNOSIS — R188 Other ascites: Secondary | ICD-10-CM | POA: Diagnosis not present

## 2021-07-26 DIAGNOSIS — N83201 Unspecified ovarian cyst, right side: Secondary | ICD-10-CM | POA: Diagnosis not present

## 2021-07-26 DIAGNOSIS — I700 Atherosclerosis of aorta: Secondary | ICD-10-CM | POA: Diagnosis not present

## 2021-07-26 DIAGNOSIS — E861 Hypovolemia: Secondary | ICD-10-CM | POA: Diagnosis not present

## 2021-07-26 DIAGNOSIS — I2510 Atherosclerotic heart disease of native coronary artery without angina pectoris: Secondary | ICD-10-CM | POA: Diagnosis present

## 2021-07-26 DIAGNOSIS — Z20822 Contact with and (suspected) exposure to covid-19: Secondary | ICD-10-CM | POA: Diagnosis not present

## 2021-07-26 DIAGNOSIS — C786 Secondary malignant neoplasm of retroperitoneum and peritoneum: Secondary | ICD-10-CM | POA: Diagnosis not present

## 2021-07-26 DIAGNOSIS — M1712 Unilateral primary osteoarthritis, left knee: Secondary | ICD-10-CM | POA: Diagnosis present

## 2021-07-26 DIAGNOSIS — I10 Essential (primary) hypertension: Secondary | ICD-10-CM | POA: Diagnosis not present

## 2021-07-26 DIAGNOSIS — C801 Malignant (primary) neoplasm, unspecified: Secondary | ICD-10-CM | POA: Diagnosis not present

## 2021-07-26 DIAGNOSIS — E119 Type 2 diabetes mellitus without complications: Secondary | ICD-10-CM | POA: Diagnosis not present

## 2021-07-26 DIAGNOSIS — C800 Disseminated malignant neoplasm, unspecified: Secondary | ICD-10-CM | POA: Diagnosis not present

## 2021-07-26 DIAGNOSIS — Z79899 Other long term (current) drug therapy: Secondary | ICD-10-CM | POA: Diagnosis not present

## 2021-07-26 DIAGNOSIS — Z794 Long term (current) use of insulin: Secondary | ICD-10-CM | POA: Diagnosis not present

## 2021-07-26 DIAGNOSIS — E876 Hypokalemia: Secondary | ICD-10-CM | POA: Diagnosis present

## 2021-07-26 DIAGNOSIS — R634 Abnormal weight loss: Secondary | ICD-10-CM | POA: Diagnosis not present

## 2021-07-26 DIAGNOSIS — E871 Hypo-osmolality and hyponatremia: Secondary | ICD-10-CM | POA: Diagnosis not present

## 2021-07-26 DIAGNOSIS — Z8249 Family history of ischemic heart disease and other diseases of the circulatory system: Secondary | ICD-10-CM | POA: Diagnosis not present

## 2021-07-26 DIAGNOSIS — R109 Unspecified abdominal pain: Secondary | ICD-10-CM | POA: Diagnosis not present

## 2021-07-26 DIAGNOSIS — Z90710 Acquired absence of both cervix and uterus: Secondary | ICD-10-CM | POA: Diagnosis not present

## 2021-07-26 DIAGNOSIS — R590 Localized enlarged lymph nodes: Secondary | ICD-10-CM | POA: Diagnosis not present

## 2021-07-26 DIAGNOSIS — K2970 Gastritis, unspecified, without bleeding: Secondary | ICD-10-CM | POA: Diagnosis not present

## 2021-07-26 LAB — COMPREHENSIVE METABOLIC PANEL
ALT: 8 U/L (ref 0–44)
AST: 17 U/L (ref 15–41)
Albumin: 2.6 g/dL — ABNORMAL LOW (ref 3.5–5.0)
Alkaline Phosphatase: 79 U/L (ref 38–126)
Anion gap: 9 (ref 5–15)
BUN: 14 mg/dL (ref 8–23)
CO2: 30 mmol/L (ref 22–32)
Calcium: 8.5 mg/dL — ABNORMAL LOW (ref 8.9–10.3)
Chloride: 94 mmol/L — ABNORMAL LOW (ref 98–111)
Creatinine, Ser: 0.7 mg/dL (ref 0.44–1.00)
GFR, Estimated: >60 mL/min (ref >60)
Glucose, Bld: 105 mg/dL — ABNORMAL HIGH (ref 70–99)
Potassium: 3.2 mmol/L — ABNORMAL LOW (ref 3.5–5.1)
Sodium: 133 mmol/L — ABNORMAL LOW (ref 135–145)
Total Bilirubin: 0.7 mg/dL (ref 0.3–1.2)
Total Protein: 5.5 g/dL — ABNORMAL LOW (ref 6.5–8.1)

## 2021-07-26 LAB — CBC
HCT: 40.6 % (ref 36.0–46.0)
Hemoglobin: 13.1 g/dL (ref 12.0–15.0)
MCH: 27.3 pg (ref 26.0–34.0)
MCHC: 32.3 g/dL (ref 30.0–36.0)
MCV: 84.6 fL (ref 80.0–100.0)
Platelets: 415 10*3/uL — ABNORMAL HIGH (ref 150–400)
RBC: 4.8 MIL/uL (ref 3.87–5.11)
RDW: 15.4 % (ref 11.5–15.5)
WBC: 8.7 10*3/uL (ref 4.0–10.5)
nRBC: 0 % (ref 0.0–0.2)

## 2021-07-26 LAB — URINALYSIS, ROUTINE W REFLEX MICROSCOPIC
Bacteria, UA: NONE SEEN
Bilirubin Urine: NEGATIVE
Glucose, UA: NEGATIVE mg/dL
Ketones, ur: NEGATIVE mg/dL
Leukocytes,Ua: NEGATIVE
Nitrite: NEGATIVE
Protein, ur: NEGATIVE mg/dL
Specific Gravity, Urine: 1.004 — ABNORMAL LOW (ref 1.005–1.030)
pH: 7 (ref 5.0–8.0)

## 2021-07-26 LAB — PH, BODY FLUID: pH, Body Fluid: 7.4

## 2021-07-26 MED ORDER — HYDROCODONE-ACETAMINOPHEN 5-325 MG PO TABS
1.0000 | ORAL_TABLET | Freq: Once | ORAL | Status: AC
Start: 2021-07-26 — End: 2021-07-26
  Administered 2021-07-26: 1 via ORAL
  Filled 2021-07-26: qty 1

## 2021-07-26 MED ORDER — BOOST / RESOURCE BREEZE PO LIQD CUSTOM
1.0000 | Freq: Three times a day (TID) | ORAL | Status: DC
  Administered 2021-07-27 – 2021-08-03 (×6): 1 via ORAL

## 2021-07-26 MED ORDER — IBUPROFEN 100 MG/5ML PO SUSP
300.0000 mg | Freq: Once | ORAL | Status: AC
  Administered 2021-07-26: 300 mg via ORAL
  Filled 2021-07-26: qty 15

## 2021-07-26 MED ORDER — FUROSEMIDE 10 MG/ML IJ SOLN
40.0000 mg | Freq: Two times a day (BID) | INTRAMUSCULAR | Status: DC
  Administered 2021-07-26 – 2021-07-31 (×11): 40 mg via INTRAVENOUS
  Filled 2021-07-26 (×11): qty 4

## 2021-07-26 MED ORDER — IBUPROFEN 100 MG PO CHEW: 300.0000 mg | CHEWABLE_TABLET | Freq: Once | ORAL | Status: DC

## 2021-07-26 MED ORDER — ADULT MULTIVITAMIN W/MINERALS CH
1.0000 | ORAL_TABLET | Freq: Every day | ORAL | Status: DC
  Administered 2021-07-26 – 2021-08-05 (×11): 1 via ORAL
  Filled 2021-07-26 (×11): qty 1

## 2021-07-26 MED ORDER — POLYETHYLENE GLYCOL 3350 17 G PO PACK
17.0000 g | PACK | Freq: Every day | ORAL | Status: DC
  Administered 2021-07-26 – 2021-07-27 (×2): 17 g via ORAL
  Filled 2021-07-26 (×6): qty 1

## 2021-07-26 MED ORDER — POTASSIUM CHLORIDE 10 MEQ/100ML IV SOLN
10.0000 meq | INTRAVENOUS | Status: DC
Start: 2021-07-26 — End: 2021-07-26
  Administered 2021-07-26: 10 meq via INTRAVENOUS
  Filled 2021-07-26: qty 100

## 2021-07-26 MED ORDER — POTASSIUM CHLORIDE CRYS ER 20 MEQ PO TBCR
40.0000 meq | EXTENDED_RELEASE_TABLET | ORAL | Status: AC
  Administered 2021-07-26 (×2): 40 meq via ORAL
  Filled 2021-07-26 (×2): qty 2

## 2021-07-26 NOTE — Plan of Care (Signed)

## 2021-07-26 NOTE — Progress Notes (Signed)
Initial Nutrition Assessment  INTERVENTION:   -Boost Breeze po TID, each supplement provides 250 kcal and 9 grams of protein  -Multivitamin with minerals daily  NUTRITION DIAGNOSIS:   Increased nutrient needs related to chronic illness as evidenced by estimated needs.  GOAL:   Patient will meet greater than or equal to 90% of their needs  MONITOR:   PO intake, Supplement acceptance, Weight trends, Labs, I & O's  REASON FOR ASSESSMENT:   Malnutrition Screening Tool    ASSESSMENT:   80 y.o. female with medical history significant of type 2 diabetes, unspecified anemia, hyperlipidemia, hypertension, osteoarthritis of the left knee, type 2 diabetes mellitus, class I obesity who is coming to the emergency department due to abdominal distention after first losing about 22 pounds in the month and then regaining 17 pounds with profound abdominal distention.  Patient currently on clears. Was having nausea and increased abdominal distention PTA, has not been eating well. Pt has been constipated as well.  Will order Boost Breeze while pt on clears.  Per patient report, pt has lost 22 lbs but states weight increased d/t abdominal distention.  Medications: Lasix, KLOR-CON  Labs reviewed:  Low Na, K  NUTRITION - FOCUSED PHYSICAL EXAM:  Unable to complete -working remote  Diet Order:   Diet Order             Diet clear liquid Room service appropriate? Yes; Fluid consistency: Thin  Diet effective now                   EDUCATION NEEDS:   No education needs have been identified at this time  Skin:  Skin Assessment: Skin Integrity Issues: Skin Integrity Issues:: Incisions Incisions: 10/8 abdomen  Last BM:  10/8 -type 1  Height:   Ht Readings from Last 1 Encounters:  07/25/21 5\' 2"  (1.575 m)    Weight:   Wt Readings from Last 1 Encounters:  07/25/21 75.1 kg    BMI:  Body mass index is 30.28 kg/m.  Estimated Nutritional Needs:   Kcal:   1500-1700  Protein:  65-80g  Fluid:  1.7L/day  Clayton Bibles, MS, RD, LDN Inpatient Clinical Dietitian Contact information available via Amion

## 2021-07-26 NOTE — Progress Notes (Signed)
PROGRESS NOTE    Amanda Gordon  ZCH:[id number] DOB: [birth year] DOA: 07/24/2021 PCP: System, Provider Not In   Brief Narrative:  Amanda Gordon is a 80 y.o. female with medical history significant of type 2 diabetes, unspecified anemia, hyperlipidemia, hypertension, osteoarthritis of the left knee, type 2 diabetes mellitus, class I obesity who is coming to the emergency department due to abdominal distention after first losing about 22 pounds in the month and then regaining 17 pounds with profound abdominal distention.  Assessment & Plan:   Intractable abdominal pain in the setting of ascites, POA - Concern for carcinomatosis given discussion with GI and oncology today  - SAAG low not consistent with cirrhotic disease - CEA, CA 19-9 and CA125 pending - Transvaginal ultrasound to evaluate cyst per oncology - Cytology pending from previous paracentesis fluid - Repeat paracentesis in the next 12 to 24 hours given worsening abdominal distention and pain - We will add IV Lasix in hopes to improve patient's fluid balance  Borderline prolonged QT interval - Continue cardiac telemetry. - Optimize electrolytes. - Avoid QT interval prolonging meds.   Hypokalemia - Repleted this am - follow repeat labs.   Hyponatremia, hypovolemic, resolving - WNL   Mild protein malnutrition ([place]) - In the setting of abdominal ascites.   Hypertension - Resume lisinopril 20 mg p.o. daily. - Resume metoprolol succinate 100 mg p.o. bedtime.   Hyperlipidemia - Currently not on medical therapy. - Follow-up with PCP to discuss treatment options.   Type 2 diabetes mellitus (HCC) - Carbohydrate modified diet. - CBG monitoring with RI SS.  DVT prophylaxis: SCDs. Code Status: Full. Family Communication: None present  Status is: Inpt  Dispo: The patient is from: Home              Anticipated d/c is to: Home              Anticipated d/c date is: 24-48h              Patient currently not medically  stable for discharge  Consultants:  None  Procedures:  Paracentesis 07/24/21  Antimicrobials:  None   Subjective: No acute issues/events overnight  Objective: Vitals:   07/25/21 0923 07/25/21 1134 07/25/21 2038 07/26/21 0517  BP: 106/72 94/65 103/67 (!) 91/56  Pulse: 65 62 72 (!) 57  Resp:  17 20 20   Temp:  97.7 F (36.5 C) 98.2 F (36.8 C) 98 F (36.7 C)  TempSrc:  Oral Oral   SpO2:  97% 92% (!) 89%  Weight:      Height:        Intake/Output Summary (Last 24 hours) at 07/26/2021 0753 Last data filed at 07/26/2021 0300 Gross per 24 hour  Intake 720 ml  Output 100 ml  Net 620 ml    Filed Weights   07/24/21 1527 07/25/21 0011  Weight: 79.2 kg 75.1 kg    Examination:  General exam: Appears calm and comfortable  Respiratory system: Clear to auscultation. Respiratory effort normal. Cardiovascular system: S1 & S2 heard, RRR. No JVD, murmurs, rubs, gallops or clicks. No pedal edema. Gastrointestinal system: Abdomen is minimally distended Central nervous system: Alert and oriented. No focal neurological deficits. Extremities: Symmetric 5 x 5 power. Skin: No rashes, lesions or ulcers Psychiatry: Judgement and insight appear normal. Mood & affect appropriate.   Data Reviewed: I have personally reviewed following labs and imaging studies  CBC: Recent Labs  Lab 07/23/21 1429 07/24/21 1225 07/25/21 0347 07/26/21 0347  WBC 10.2 10.0  7.9 8.7  NEUTROABS 8.0* 8.2*  --   --   HGB 14.4 14.3 11.9* 13.1  HCT 45.2 44.6 36.4 40.6  MCV 85.4 85.0 84.3 84.6  PLT 512* 477* 349 415*    Basic Metabolic Panel: Recent Labs  Lab 07/23/21 1429 07/24/21 1225 07/24/21 1640 07/25/21 0347 07/26/21 0347  NA 137 133*  --  140 133*  K 3.3* 3.4*  --  3.2* 3.2*  CL 94* 90*  --  98 94*  CO2 30 31  --  30 30  GLUCOSE 129* 134*  --  89 105*  BUN 22 24*  --  18 14  CREATININE 0.68 0.76  --  0.66 0.70  CALCIUM 9.5 9.3  --  8.6* 8.5*  MG  --   --  1.9  --   --   PHOS  --   --   4.0  --   --     GFR: Estimated Creatinine Clearance: 54.1 mL/min (by C-G formula based on SCr of 0.7 mg/dL). Liver Function Tests: Recent Labs  Lab 07/23/21 1429 07/24/21 1225 07/25/21 0347 07/26/21 0347  AST 19 17 15 17   ALT 8 9 8 8   ALKPHOS 97 93 72 79  BILITOT 0.5 0.5 0.6 0.7  PROT 7.0 6.7 5.1* 5.5*  ALBUMIN 3.2* 3.3* 2.5* 2.6*    Recent Labs  Lab 07/24/21 1225  LIPASE 18    No results for input(s): AMMONIA in the last 168 hours. Coagulation Profile: No results for input(s): INR, PROTIME in the last 168 hours. Cardiac Enzymes: No results for input(s): CKTOTAL, CKMB, CKMBINDEX, TROPONINI in the last 168 hours. BNP (last 3 results) No results for input(s): PROBNP in the last 8760 hours. HbA1C: Recent Labs    07/23/21 1429  HGBA1C 6.5*    CBG: Recent Labs  Lab 07/23/21 1359  GLUCAP 137*    Lipid Profile: No results for input(s): CHOL, HDL, LDLCALC, TRIG, CHOLHDL, LDLDIRECT in the last 72 hours. Thyroid Function Tests: No results for input(s): TSH, T4TOTAL, FREET4, T3FREE, THYROIDAB in the last 72 hours. Anemia Panel: No results for input(s): VITAMINB12, FOLATE, FERRITIN, TIBC, IRON, RETICCTPCT in the last 72 hours. Sepsis Labs: No results for input(s): PROCALCITON, LATICACIDVEN in the last 168 hours.  Recent Results (from the past 240 hour(s))  Surgical pcr screen     Status: None   Collection Time: 07/23/21  2:29 PM   Specimen: Nasal Mucosa; Nasal Swab  Result Value Ref Range Status   MRSA, PCR NEGATIVE NEGATIVE Final   Staphylococcus aureus NEGATIVE NEGATIVE Final    Comment: (NOTE) The Xpert SA Assay (FDA approved for NASAL specimens in patients 76 years of age and older), is one component of a comprehensive surveillance program. It is not intended to diagnose infection nor to guide or monitor treatment. Performed at [facility], [place] [address], [place], [place] [address]   Urine Culture     Status: Abnormal   Collection  Time: 07/23/21  2:29 PM   Specimen: Urine, Clean Catch  Result Value Ref Range Status   Specimen Description   Final    URINE, CLEAN CATCH Performed at [facility], [place] [address], [place], [place] [address]    Special Requests   Final    NONE Performed at [facility], [place] [address], [place], [place] [address]    Culture (A)  Final    <10,000 COLONIES/mL INSIGNIFICANT GROWTH Performed at [facility]  [address], [place], [place] [address]    Report Status 07/24/2021 FINAL  Final  Resp Panel by RT-PCR (Flu A&B, Covid) Nasopharyngeal Swab     Status: None   Collection Time: 07/24/21  4:16 PM   Specimen: Nasopharyngeal Swab; Nasopharyngeal(NP) swabs in vial transport medium  Result Value Ref Range Status   SARS Coronavirus 2 by RT PCR NEGATIVE NEGATIVE Final    Comment: (NOTE) SARS-CoV-2 target nucleic acids are NOT DETECTED.  The SARS-CoV-2 RNA is generally detectable in upper respiratory specimens during the acute phase of infection. The lowest concentration of SARS-CoV-2 viral copies this assay can detect is 138 copies/mL. A negative result does not preclude SARS-Cov-2 infection and should not be used as the sole basis for treatment or other patient management decisions. A negative result may occur with  improper specimen collection/handling, submission of specimen other than nasopharyngeal swab, presence of viral mutation(s) within the areas targeted by this assay, and inadequate number of viral copies(<138 copies/mL). A negative result must be combined with clinical observations, patient history, and epidemiological information. The expected result is Negative.  Fact Sheet for Patients:  EntrepreneurPulse.com.au  Fact Sheet for Healthcare Providers:  IncredibleEmployment.be  This test is no t yet approved or cleared by the [place] FDA and  has been authorized for detection and/or  diagnosis of SARS-CoV-2 by FDA under an Emergency Use Authorization (EUA). This EUA will remain  in effect (meaning this test can be used) for the duration of the COVID-19 declaration under Section 564(b)(1) of the Act, 21 U.S.C.section 360bbb-3(b)(1), unless the authorization is terminated  or revoked sooner.       Influenza A by PCR NEGATIVE NEGATIVE Final   Influenza B by PCR NEGATIVE NEGATIVE Final    Comment: (NOTE) The Xpert Xpress SARS-CoV-2/FLU/RSV plus assay is intended as an aid in the diagnosis of influenza from Nasopharyngeal swab specimens and should not be used as a sole basis for treatment. Nasal washings and aspirates are unacceptable for Xpert Xpress SARS-CoV-2/FLU/RSV testing.  Fact Sheet for Patients: EntrepreneurPulse.com.au  Fact Sheet for Healthcare Providers: IncredibleEmployment.be  This test is not yet approved or cleared by the [place] FDA and has been authorized for detection and/or diagnosis of SARS-CoV-2 by FDA under an Emergency Use Authorization (EUA). This EUA will remain in effect (meaning this test can be used) for the duration of the COVID-19 declaration under Section 564(b)(1) of the Act, 21 U.S.C. section 360bbb-3(b)(1), unless the authorization is terminated or revoked.  Performed at [facility], [place] [address], [place], [place] [address]           Radiology Studies: CT Abdomen Pelvis W Contrast  Result Date: 07/24/2021 CLINICAL DATA:  Abdominal distension, nausea. EXAM: CT ABDOMEN AND PELVIS WITH CONTRAST TECHNIQUE: Multidetector CT imaging of the abdomen and pelvis was performed using the standard protocol following bolus administration of intravenous contrast. CONTRAST:  45mL OMNIPAQUE IOHEXOL 350 MG/ML SOLN COMPARISON:  None. FINDINGS: Lower chest: Small bilateral pleural effusions are noted with adjacent subsegmental atelectasis. Hepatobiliary: No focal liver  abnormality is seen. Status post cholecystectomy. No biliary dilatation. Pancreas: Unremarkable. No pancreatic ductal dilatation or surrounding inflammatory changes. Spleen: Calcified splenic granulomata are noted. Adrenals/Urinary Tract: Adrenal glands are unremarkable. Kidneys are normal, without renal calculi, focal lesion, or hydronephrosis. Bladder is unremarkable. Stomach/Bowel: Stomach appears normal. There is no evidence of bowel obstruction or inflammation. Status post appendectomy. Sigmoid diverticulosis is noted without inflammation. Vascular/Lymphatic: Aortic atherosclerosis. Mildly enlarged retroperitoneal and mesenteric lymph nodes are noted  which may be inflammatory in etiology, but malignancy or metastatic disease cannot be excluded. Largest lymph node measures 13 mm in right periaortic region. Reproductive: Status post hysterectomy. 3.8 cm right ovarian cystic abnormality is noted. No definite left adnexal abnormality is noted. Other: Moderate ascites is noted. Mild irregular densities are seen involving the omentum concerning for possible peritoneal carcinomatosis. Musculoskeletal: No acute or significant osseous findings. IMPRESSION: Moderate ascites is noted. Mildly enlarged retroperitoneal mesenteric lymph nodes are noted, the largest measuring 13 mm in the right periaortic region. While these may be inflammatory in etiology, malignancy or metastatic disease cannot be excluded. Mild irregular densities are seen involving the omentum which may represent inflammation, but peritoneal carcinomatosis cannot be excluded. Status post hysterectomy. 3.8 cm right ovarian cyst is noted. Recommend follow-up US in 6-12 months. Note: This recommendation does not apply to premenarchal patients and to those with increased risk (genetic, family history, elevated tumor markers or other high-risk factors) of ovarian cancer. Reference: JACR 2020 Feb; 17(2):248-254. Sigmoid diverticulosis without inflammation. Small  bilateral pleural effusions with minimal adjacent subsegmental atelectasis. Aortic Atherosclerosis (ICD10-I70.0). Electronically Signed   By: Marijo Conception M.D.   On: 07/24/2021 15:43   US Paracentesis  Result Date: 07/25/2021 INDICATION: Concern for intra-abdominal malignancy, now with symptomatic ascites. Please perform ultrasound-guided paracentesis for diagnostic and therapeutic purposes. Max paracentesis volume of 5 L requested. EXAM: ULTRASOUND GUIDED DIAGNOSTIC AND THERAPEUTIC PARACENTESIS COMPARISON:  CT abdomen and pelvis-earlier same day MEDICATIONS: None. COMPLICATIONS: None immediate. TECHNIQUE: Informed written consent was obtained from the patient after a discussion of the risks, benefits and alternatives to treatment. A timeout was performed prior to the initiation of the procedure. Initial ultrasound scanning demonstrates a large amount of ascites within the left lower abdomen which was subsequently prepped and draped in the usual sterile fashion. 1% lidocaine with epinephrine was used for local anesthesia. An ultrasound image was saved for documentation purposed. An 8 Fr Safe-T-Centesis catheter was introduced. The paracentesis was performed. The catheter was removed and a dressing was applied. The patient tolerated the procedure well without immediate post procedural complication. FINDINGS: A total of approximately 5 liters of serous fluid was removed. Samples were sent to the laboratory as requested by the clinical team. IMPRESSION: Successful ultrasound-guided paracentesis yielding 5 liters of peritoneal fluid. Electronically Signed   By: Sandi Mariscal M.D.   On: 07/25/2021 08:18    Scheduled Meds:  amLODipine  10 mg Oral Daily   chlorhexidine  15 mL Mouth Rinse BID   lisinopril  20 mg Oral Daily   mouth rinse  15 mL Mouth Rinse q12n4p   metoprolol succinate  100 mg Oral QHS   pantoprazole  40 mg Oral Daily   Continuous Infusions:     LOS: 0 days   Time spent: 62min  Jaylei Fuerte  C Izrael Peak, DO [facility]  If 7PM-7AM, please contact night-coverage [email or web address]  07/26/2021, 7:53 AM

## 2021-07-26 NOTE — Progress Notes (Signed)
Notified MD of pain in ab  8/10, orders received.

## 2021-07-26 NOTE — Progress Notes (Signed)
Notified MD of continued ab pain 8/10 with facial grimacing and guarding abdomen.  Orders received.

## 2021-07-27 ENCOUNTER — Encounter (HOSPITAL_COMMUNITY): Payer: Self-pay | Admitting: Internal Medicine

## 2021-07-27 ENCOUNTER — Inpatient Hospital Stay (HOSPITAL_COMMUNITY): Payer: Medicare Other

## 2021-07-27 DIAGNOSIS — R109 Unspecified abdominal pain: Secondary | ICD-10-CM

## 2021-07-27 DIAGNOSIS — R590 Localized enlarged lymph nodes: Secondary | ICD-10-CM

## 2021-07-27 DIAGNOSIS — R180 Malignant ascites: Secondary | ICD-10-CM | POA: Diagnosis not present

## 2021-07-27 DIAGNOSIS — I10 Essential (primary) hypertension: Secondary | ICD-10-CM

## 2021-07-27 DIAGNOSIS — N83201 Unspecified ovarian cyst, right side: Secondary | ICD-10-CM | POA: Diagnosis not present

## 2021-07-27 DIAGNOSIS — E46 Unspecified protein-calorie malnutrition: Secondary | ICD-10-CM

## 2021-07-27 DIAGNOSIS — E785 Hyperlipidemia, unspecified: Secondary | ICD-10-CM

## 2021-07-27 DIAGNOSIS — E119 Type 2 diabetes mellitus without complications: Secondary | ICD-10-CM

## 2021-07-27 LAB — CBC
HCT: 39.1 % (ref 36.0–46.0)
Hemoglobin: 12.9 g/dL (ref 12.0–15.0)
MCH: 27.6 pg (ref 26.0–34.0)
MCHC: 33 g/dL (ref 30.0–36.0)
MCV: 83.5 fL (ref 80.0–100.0)
Platelets: 382 10*3/uL (ref 150–400)
RBC: 4.68 MIL/uL (ref 3.87–5.11)
RDW: 15.3 % (ref 11.5–15.5)
WBC: 8.6 10*3/uL (ref 4.0–10.5)
nRBC: 0 % (ref 0.0–0.2)

## 2021-07-27 LAB — COMPREHENSIVE METABOLIC PANEL
ALT: 8 U/L (ref 0–44)
AST: 13 U/L — ABNORMAL LOW (ref 15–41)
Albumin: 2.4 g/dL — ABNORMAL LOW (ref 3.5–5.0)
Alkaline Phosphatase: 80 U/L (ref 38–126)
Anion gap: 9 (ref 5–15)
BUN: 14 mg/dL (ref 8–23)
CO2: 28 mmol/L (ref 22–32)
Calcium: 8.8 mg/dL — ABNORMAL LOW (ref 8.9–10.3)
Chloride: 102 mmol/L (ref 98–111)
Creatinine, Ser: 0.62 mg/dL (ref 0.44–1.00)
GFR, Estimated: >60 mL/min (ref >60)
Glucose, Bld: 92 mg/dL (ref 70–99)
Potassium: 4.8 mmol/L (ref 3.5–5.1)
Sodium: 139 mmol/L (ref 135–145)
Total Bilirubin: 0.4 mg/dL (ref 0.3–1.2)
Total Protein: 5.2 g/dL — ABNORMAL LOW (ref 6.5–8.1)

## 2021-07-27 LAB — CANCER ANTIGEN 19-9: CA 19-9: 2040 U/mL — ABNORMAL HIGH (ref 0–35)

## 2021-07-27 LAB — CA 125: Cancer Antigen (CA) 125: 124 U/mL — ABNORMAL HIGH (ref 0.0–38.1)

## 2021-07-27 LAB — CEA: CEA: 32.7 ng/mL — ABNORMAL HIGH (ref 0.0–4.7)

## 2021-07-27 MED ORDER — HYDROCODONE-ACETAMINOPHEN 5-325 MG PO TABS
1.0000 | ORAL_TABLET | Freq: Four times a day (QID) | ORAL | Status: DC | PRN
  Administered 2021-07-28 (×2): 2 via ORAL
  Filled 2021-07-27: qty 2
  Filled 2021-07-27: qty 1
  Filled 2021-07-27: qty 2

## 2021-07-27 MED ORDER — LIDOCAINE HCL 1 % IJ SOLN
INTRAMUSCULAR | Status: AC
  Filled 2021-07-27: qty 20

## 2021-07-27 MED ORDER — HYDROCODONE-ACETAMINOPHEN 5-325 MG PO TABS
1.0000 | ORAL_TABLET | Freq: Once | ORAL | Status: AC
Start: 2021-07-27 — End: 2021-07-27
  Administered 2021-07-27: 1 via ORAL
  Filled 2021-07-27: qty 1

## 2021-07-27 NOTE — Progress Notes (Signed)
PROGRESS NOTE    Amanda Gordon  POL:[id number] DOB: 1941-10-17 DOA: 07/24/2021 PCP: System, Provider Not In   Brief Narrative:  Amanda Gordon is a 80 y.o. female with medical history significant of type 2 diabetes, unspecified anemia, hyperlipidemia, hypertension, osteoarthritis of the left knee, type 2 diabetes mellitus, class I obesity who is coming to the emergency department due to abdominal distention after first losing about 22 pounds in the month and then regaining 17 pounds with profound abdominal distention.  Assessment & Plan:   Intractable abdominal pain in the setting of ascites, POA - Concern for carcinomatosis given discussion with GI and oncology today  - SAAG low not consistent with cirrhotic disease - CEA, CA 19-9 and CA125 all elevated - Transvaginal ultrasound shows 4.2 cm right ovarian cyst somewhat complex and considered indeterminate for malignancy per radiology - Cytology pending from previous paracentesis fluid - Repeat paracentesis in the next 12 to 24 hours given worsening abdominal distention and pain - We will add IV Lasix in hopes to improve patient's fluid balance  Borderline prolonged QT interval - Continue cardiac telemetry. -Electrolytes currently well controlled.   Hypokalemia - Repleted this am - follow repeat labs.   Hyponatremia, hypovolemic, resolving - WNL   Mild protein malnutrition ([place]) - In the setting of abdominal ascites.   Hypertension - Resume lisinopril 20 mg p.o. daily. - Resume metoprolol succinate 100 mg p.o. bedtime.   Hyperlipidemia - Currently not on medical therapy. - Follow-up with PCP to discuss treatment options.   Type 2 diabetes mellitus (HCC) - Carbohydrate modified diet. - CBG monitoring with RI SS.  DVT prophylaxis: SCDs. Code Status: Full. Family Communication: None present  Status is: Inpt  Dispo: The patient is from: Home              Anticipated d/c is to: Home              Anticipated d/c  date is: 24-48h              Patient currently not medically stable for discharge  Consultants:  None  Procedures:  Paracentesis 07/24/21  Antimicrobials:  None   Subjective: No acute issues/events overnight, abdominal pain ongoing poorly controlled with Tylenol at this time otherwise denies nausea vomiting diarrhea constipation headache fevers chills or chest pain.  Objective: Vitals:   07/26/21 1003 07/26/21 2004 07/27/21 0101 07/27/21 0505  BP: (!) 92/59 104/67  100/66  Pulse: 62 78  67  Resp:  20 19 20   Temp:  98.7 F (37.1 C)  98.4 F (36.9 C)  TempSrc:  Oral  Oral  SpO2:  96% 98% 90%  Weight:      Height:        Intake/Output Summary (Last 24 hours) at 07/27/2021 0809 Last data filed at 07/27/2021 0145 Gross per 24 hour  Intake 240 ml  Output 1855 ml  Net -1615 ml    Filed Weights   07/24/21 1527 07/25/21 0011  Weight: 79.2 kg 75.1 kg    Examination:  General exam: Appears calm and comfortable  Respiratory system: Clear to auscultation. Respiratory effort normal. Cardiovascular system: S1 & S2 heard, RRR. No JVD, murmurs, rubs, gallops or clicks. No pedal edema. Gastrointestinal system: Abdomen is minimally distended Central nervous system: Alert and oriented. No focal neurological deficits. Extremities: Symmetric 5 x 5 power. Skin: No rashes, lesions or ulcers Psychiatry: Judgement and insight appear normal. Mood & affect appropriate.   Data Reviewed: I have personally reviewed  following labs and imaging studies  CBC: Recent Labs  Lab 07/23/21 1429 07/24/21 1225 07/25/21 0347 07/26/21 0347 07/27/21 0348  WBC 10.2 10.0 7.9 8.7 8.6  NEUTROABS 8.0* 8.2*  --   --   --   HGB 14.4 14.3 11.9* 13.1 12.9  HCT 45.2 44.6 36.4 40.6 39.1  MCV 85.4 85.0 84.3 84.6 83.5  PLT 512* 477* 349 415* 330    Basic Metabolic Panel: Recent Labs  Lab 07/23/21 1429 07/24/21 1225 07/24/21 1640 07/25/21 0347 07/26/21 0347 07/27/21 0348  NA 137 133*  --  140  133* 139  K 3.3* 3.4*  --  3.2* 3.2* 4.8  CL 94* 90*  --  98 94* 102  CO2 30 31  --  30 30 28   GLUCOSE 129* 134*  --  89 105* 92  BUN 22 24*  --  18 14 14   CREATININE 0.68 0.76  --  0.66 0.70 0.62  CALCIUM 9.5 9.3  --  8.6* 8.5* 8.8*  MG  --   --  1.9  --   --   --   PHOS  --   --  4.0  --   --   --     GFR: Estimated Creatinine Clearance: 54.1 mL/min (by C-G formula based on SCr of 0.62 mg/dL). Liver Function Tests: Recent Labs  Lab 07/23/21 1429 07/24/21 1225 07/25/21 0347 07/26/21 0347 07/27/21 0348  AST 19 17 15 17  13*  ALT 8 9 8 8 8   ALKPHOS 97 93 72 79 80  BILITOT 0.5 0.5 0.6 0.7 0.4  PROT 7.0 6.7 5.1* 5.5* 5.2*  ALBUMIN 3.2* 3.3* 2.5* 2.6* 2.4*    Recent Labs  Lab 07/24/21 1225  LIPASE 18    No results for input(s): AMMONIA in the last 168 hours. Coagulation Profile: No results for input(s): INR, PROTIME in the last 168 hours. Cardiac Enzymes: No results for input(s): CKTOTAL, CKMB, CKMBINDEX, TROPONINI in the last 168 hours. BNP (last 3 results) No results for input(s): PROBNP in the last 8760 hours. HbA1C: No results for input(s): HGBA1C in the last 72 hours.  CBG: Recent Labs  Lab 07/23/21 1359  GLUCAP 137*    Lipid Profile: No results for input(s): CHOL, HDL, LDLCALC, TRIG, CHOLHDL, LDLDIRECT in the last 72 hours. Thyroid Function Tests: No results for input(s): TSH, T4TOTAL, FREET4, T3FREE, THYROIDAB in the last 72 hours. Anemia Panel: No results for input(s): VITAMINB12, FOLATE, FERRITIN, TIBC, IRON, RETICCTPCT in the last 72 hours. Sepsis Labs: No results for input(s): PROCALCITON, LATICACIDVEN in the last 168 hours.  Recent Results (from the past 240 hour(s))  Surgical pcr screen     Status: None   Collection Time: 07/23/21  2:29 PM   Specimen: Nasal Mucosa; Nasal Swab  Result Value Ref Range Status   MRSA, PCR NEGATIVE NEGATIVE Final   Staphylococcus aureus NEGATIVE NEGATIVE Final    Comment: (NOTE) The Xpert SA Assay (FDA approved  for NASAL specimens in patients 80 years of age and older), is one component of a comprehensive surveillance program. It is not intended to diagnose infection nor to guide or monitor treatment. Performed at [facility], [place] [address], [place], [place] [address]   Urine Culture     Status: Abnormal   Collection Time: 07/23/21  2:29 PM   Specimen: Urine, Clean Catch  Result Value Ref Range Status   Specimen Description   Final    URINE, CLEAN CATCH Performed at [facility],  [place] [address], [place], [place] [address]    Special Requests   Final    NONE Performed at [facility], [place] [address], [place], [place] [address]    Culture (A)  Final    <10,000 COLONIES/mL INSIGNIFICANT GROWTH Performed at [place] [address] [place], [place] [address]    Report Status 07/24/2021 FINAL  Final  Resp Panel by RT-PCR (Flu A&B, Covid) Nasopharyngeal Swab     Status: None   Collection Time: 07/24/21  4:16 PM   Specimen: Nasopharyngeal Swab; Nasopharyngeal(NP) swabs in vial transport medium  Result Value Ref Range Status   SARS Coronavirus 2 by RT PCR NEGATIVE NEGATIVE Final    Comment: (NOTE) SARS-CoV-2 target nucleic acids are NOT DETECTED.  The SARS-CoV-2 RNA is generally detectable in upper respiratory specimens during the acute phase of infection. The lowest concentration of SARS-CoV-2 viral copies this assay can detect is 138 copies/mL. A negative result does not preclude SARS-Cov-2 infection and should not be used as the sole basis for treatment or other patient management decisions. A negative result may occur with  improper specimen collection/handling, submission of specimen other than nasopharyngeal swab, presence of viral mutation(s) within the areas targeted by this assay, and inadequate number of viral copies(<138 copies/mL). A negative result must be combined with clinical observations, patient  history, and epidemiological information. The expected result is Negative.  Fact Sheet for Patients:  EntrepreneurPulse.com.au  Fact Sheet for Healthcare Providers:  IncredibleEmployment.be  This test is no t yet approved or cleared by the [place] FDA and  has been authorized for detection and/or diagnosis of SARS-CoV-2 by FDA under an Emergency Use Authorization (EUA). This EUA will remain  in effect (meaning this test can be used) for the duration of the COVID-19 declaration under Section 564(b)(1) of the Act, 21 U.S.C.section 360bbb-3(b)(1), unless the authorization is terminated  or revoked sooner.       Influenza A by PCR NEGATIVE NEGATIVE Final   Influenza B by PCR NEGATIVE NEGATIVE Final    Comment: (NOTE) The Xpert Xpress SARS-CoV-2/FLU/RSV plus assay is intended as an aid in the diagnosis of influenza from Nasopharyngeal swab specimens and should not be used as a sole basis for treatment. Nasal washings and aspirates are unacceptable for Xpert Xpress SARS-CoV-2/FLU/RSV testing.  Fact Sheet for Patients: EntrepreneurPulse.com.au  Fact Sheet for Healthcare Providers: IncredibleEmployment.be  This test is not yet approved or cleared by the [place] FDA and has been authorized for detection and/or diagnosis of SARS-CoV-2 by FDA under an Emergency Use Authorization (EUA). This EUA will remain in effect (meaning this test can be used) for the duration of the COVID-19 declaration under Section 564(b)(1) of the Act, 21 U.S.C. section 360bbb-3(b)(1), unless the authorization is terminated or revoked.  Performed at [facility], [place] [address], [place], [place] [address]           Radiology Studies: US PELVIS TRANSVAGINAL NON-OB (TV ONLY)  Result Date: 07/26/2021 CLINICAL DATA:  80 year old female inpatient with a history of hysterectomy and ascites and right  adnexal cystic lesion on recent CT. EXAM: ULTRASOUND PELVIS TRANSVAGINAL TECHNIQUE: Transvaginal ultrasound examination of the pelvis was performed including evaluation of the uterus, ovaries, adnexal regions, and pelvic cul-de-sac. COMPARISON:  07/24/2021 CT abdomen/pelvis. FINDINGS: Uterus Surgically absent. Right ovary Measurements: 4.6 x 4.4 x 3.5 cm = volume: 37 mL. There is limited visualization of a 4.2 x 3.9 x 2.8 cm right ovarian cyst with questionable  mildly thickened eccentric septation, and no internal vascularity on color Doppler or discrete mural nodularity. Left ovary Nonvisualization of the left ovary.  No left adnexal masses. Other findings:  No abnormal free fluid IMPRESSION: 1. Limited visualization of a 4.2 cm right ovarian cyst, which appears mildly complex with mildly thickened eccentric septation. This cystic lesion is indeterminate for malignancy particularly given the other worrisome findings of large volume ascites and possible omental caking on the recent CT. 2. No abnormal findings at the hysterectomy margin. 3. Nonvisualization of the left ovary.  No left adnexal masses. Electronically Signed   By: Ilona Sorrel M.D.   On: 07/26/2021 12:59    Scheduled Meds:  amLODipine  10 mg Oral Daily   chlorhexidine  15 mL Mouth Rinse BID   feeding supplement  1 Container Oral TID BM   furosemide  40 mg Intravenous BID   lisinopril  20 mg Oral Daily   mouth rinse  15 mL Mouth Rinse q12n4p   metoprolol succinate  100 mg Oral QHS   multivitamin with minerals  1 tablet Oral Daily   pantoprazole  40 mg Oral Daily   polyethylene glycol  17 g Oral Daily   Continuous Infusions:     LOS: 1 day   Time spent: 17min  Bernadett Milian C Calyx Hawker, DO [facility]  If 7PM-7AM, please contact night-coverage [email or web address]  07/27/2021, 8:09 AM

## 2021-07-27 NOTE — Progress Notes (Signed)
Patient presented to [place] for repeat para after having 5L removed on evening of 10/7. She reports "leaking" at prior para site.  A bag had previously been placed over the incision site and contained approx 2-3cc of translucent yellow fluid.  Examined site which was dry, and despite manipulation, could not express any additional fluid at this time.  In observation of current ascites via [place], there did not appear to be a significant amount.  In offering patient the option of having a repeat paracentesis today vs. Waiting until she is symptomatic, patient voiced desire to wait until there is more fluid or she becomes symptomatic.  The bag over the prior para site was removed during evaluation and site bandaged and labeled with date/time. If there continues to be seepage of apparent ascites, please consult to reevaluate.  Electronically Signed: Pasty Spillers 07/27/2021, 10:45 AM

## 2021-07-27 NOTE — Consult Note (Addendum)
[place]  [phone] [phone]   [place]  Referral MD: Dr. Holli Humbles  Reason for Referral: Retroperitoneal lymphadenopathy, possible peritoneal carcinomatosis  HPI: Ms. Prestia is a 80 year old female with a past medical history significant for type 2 diabetes mellitus, anemia, hyperlipidemia, hypertension, osteoarthritis of the left knee, obesity.  She presented to the emergency department with abdominal distention.  Recently, she has lost about 22 pounds and then regained about 17 pounds mostly due to fluid.  She has had persistent dry heaving and decreased appetite.  She is also experience constipation and had an episode of hematochezia several days prior to admission.  On admission, her WBC was 10.0, hemoglobin 14.3, platelets 4-77,000.  Sodium was 133, potassium 3.4, BUN 24, creatinine 0.76.  She had a CT of the abdomen/pelvis with contrast which showed moderate ascites, mildly enlarged retroperitoneal mesenteric lymph nodes which may be inflammatory or represent malignancy/metastatic disease, mild irregular densities in the omentum which could be inflammatory but peritoneal carcinomatosis cannot be excluded.  She underwent ultrasound-guided paracentesis on 07/24/2021 and 5 L of fluid was removed.  Fluid has been sent for cytology which is currently pending.  Tumor markers have been drawn and are currently pending including a CEA, CA 19.9, and CA125.  Transvaginal ultrasound performed 07/26/2021 showed limited visualization of a 4.2 cm right ovarian cyst which appears mildly complex with mildly thickened eccentric septation and the cystic lesion is indeterminate for malignancy particularly given the other worrisome findings seen on CT.  I met with the patient and her 2 daughters in her hospital room.  She reports ongoing abdominal discomfort.  She has had some baseline nausea but not currently vomiting.  She has been  tolerating a clear liquid diet and will advance her diet today.  She is not having any headaches or dizziness.  She denies chest pain or shortness of breath.  Bowels are moving but she has had some difficulty with constipation recently.  She denies lower extremity edema and bleeding.  The patient is widowed.  She has 3 children-2 daughters and a son.  She denies history of alcohol and tobacco use.  Family history significant for a maternal grandmother with cancer (unclear primary), sister with breast cancer, and another sister with carcinoid.  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  Past Medical History:  Diagnosis Date   Allergy    Anemia    Complication of anesthesia    Hyperlipidemia    Hypertension    PONV (postoperative nausea and vomiting)    Primary localized osteoarthritis of left knee 07/21/2021   Type 2 diabetes mellitus ([place])   : Past Surgical History:  Procedure Laterality Date   ABDOMINAL HYSTERECTOMY     APPENDECTOMY     CHOLECYSTECTOMY    : Current Facility-Administered Medications  Medication Dose Route Frequency Provider Last Rate Last Admin   acetaminophen (TYLENOL) tablet 650 mg  650 mg Oral Q6H PRN Reubin Milan, MD   650 mg at 07/26/21 2044   Or   acetaminophen (TYLENOL) suppository 650 mg  650 mg Rectal Q6H PRN Reubin Milan, MD       amLODipine (NORVASC) tablet 10 mg  10 mg Oral Daily Reubin Milan, MD   10 mg at 07/25/21 4540   bisacodyl (DULCOLAX) EC tablet 5 mg  5 mg Oral Daily PRN Lovey Newcomer T, NP   5 mg at 07/25/21 2223  chlorhexidine (PERIDEX) 0.12 % solution 15 mL  15 mL Mouth Rinse BID Bobette Mo, MD   15 mL at 07/26/21 1005   feeding supplement (BOOST / RESOURCE BREEZE) liquid 1 Container  1 Container Oral TID BM Azucena Fallen, MD       furosemide (LASIX) injection 40 mg  40 mg Intravenous BID Azucena Fallen,  MD   40 mg at 07/26/21 1747   lisinopril (ZESTRIL) tablet 20 mg  20 mg Oral Daily Bobette Mo, MD   20 mg at 07/25/21 6363   MEDLINE mouth rinse  15 mL Mouth Rinse q12n4p Bobette Mo, MD       metoprolol succinate (TOPROL-XL) 24 hr tablet 100 mg  100 mg Oral QHS Bobette Mo, MD   100 mg at 07/26/21 2100   multivitamin with minerals tablet 1 tablet  1 tablet Oral Daily Azucena Fallen, MD   1 tablet at 07/26/21 1749   pantoprazole (PROTONIX) EC tablet 40 mg  40 mg Oral Daily Bobette Mo, MD   40 mg at 07/26/21 1005   polyethylene glycol (MIRALAX / GLYCOLAX) packet 17 g  17 g Oral Daily Azucena Fallen, MD   17 g at 07/26/21 2050   prochlorperazine (COMPAZINE) injection 5 mg  5 mg Intravenous Q4H PRN Bobette Mo, MD          Allergies  Allergen Reactions   Codeine Nausea Only    "Dizziness"  :   Family History  Problem Relation Age of Onset   Heart disease Father    Hypertension Father    Stroke Father    Diabetes Brother    Heart disease Paternal Grandmother    Stroke Paternal Grandfather   :   Social History   Socioeconomic History   Marital status: Married    Spouse name: Not on file   Number of children: Not on file   Years of education: Not on file   Highest education level: Not on file  Occupational History   Not on file  Tobacco Use   Smoking status: Never   Smokeless tobacco: Never  Vaping Use   Vaping Use: Never used  Substance and Sexual Activity   Alcohol use: No   Drug use: No   Sexual activity: Not on file  Other Topics Concern   Not on file  Social History Narrative   Not on file   Social Determinants of Health   Financial Resource Strain: Not on file  Food Insecurity: Not on file  Transportation Needs: Not on file  Physical Activity: Not on file  Stress: Not on file  Social Connections: Not on file  Intimate Partner Violence: Not on file  :  Review of Systems: A comprehensive 14 point  review of systems was negative except as noted in the HPI.  Exam: Patient Vitals for the past 24 hrs:  BP Temp Temp src Pulse Resp SpO2  07/27/21 0505 100/66 98.4 F (36.9 C) Oral 67 20 90 %  07/27/21 0101 -- -- -- -- 19 98 %  07/26/21 2004 104/67 98.7 F (37.1 C) Oral 78 20 96 %  07/26/21 1003 (!) 92/59 -- -- 62 -- --    General: Awake and alert, no distress. Eyes:  no scleral icterus.   ENT:  There were no oropharyngeal lesions.     Lymphatics:  Negative cervical, supraclavicular or axillary adenopathy.   Respiratory: lungs were clear bilaterally without wheezing or crackles.  Cardiovascular:  Regular rate and rhythm, S1/S2, without murmur, rub or gallop.  There was no pedal edema.   GI: Positive bowel sounds, abdomen distended, ascites present.  Skin exam was without echymosis, petichae.   Neuro exam was nonfocal. Patient was alert and oriented.  Attention was good.   Language was appropriate.  Mood was normal without depression.  Speech was not pressured.  Thought content was not tangential.     Lab Results  Component Value Date   WBC 8.6 07/27/2021   HGB 12.9 07/27/2021   HCT 39.1 07/27/2021   PLT 382 07/27/2021   GLUCOSE 92 07/27/2021   ALT 8 07/27/2021   AST 13 (L) 07/27/2021   NA 139 07/27/2021   K 4.8 07/27/2021   CL 102 07/27/2021   CREATININE 0.62 07/27/2021   BUN 14 07/27/2021   CO2 28 07/27/2021    US PELVIS TRANSVAGINAL NON-OB (TV ONLY)  Result Date: 07/26/2021 CLINICAL DATA:  80 year old female inpatient with a history of hysterectomy and ascites and right adnexal cystic lesion on recent CT. EXAM: ULTRASOUND PELVIS TRANSVAGINAL TECHNIQUE: Transvaginal ultrasound examination of the pelvis was performed including evaluation of the uterus, ovaries, adnexal regions, and pelvic cul-de-sac. COMPARISON:  07/24/2021 CT abdomen/pelvis. FINDINGS: Uterus Surgically absent. Right ovary Measurements: 4.6 x 4.4 x 3.5 cm = volume: 37 mL. There is limited visualization of a  4.2 x 3.9 x 2.8 cm right ovarian cyst with questionable mildly thickened eccentric septation, and no internal vascularity on color Doppler or discrete mural nodularity. Left ovary Nonvisualization of the left ovary.  No left adnexal masses. Other findings:  No abnormal free fluid IMPRESSION: 1. Limited visualization of a 4.2 cm right ovarian cyst, which appears mildly complex with mildly thickened eccentric septation. This cystic lesion is indeterminate for malignancy particularly given the other worrisome findings of large volume ascites and possible omental caking on the recent CT. 2. No abnormal findings at the hysterectomy margin. 3. Nonvisualization of the left ovary.  No left adnexal masses. Electronically Signed   By: Ilona Sorrel M.D.   On: 07/26/2021 12:59   CT Abdomen Pelvis W Contrast  Result Date: 07/24/2021 CLINICAL DATA:  Abdominal distension, nausea. EXAM: CT ABDOMEN AND PELVIS WITH CONTRAST TECHNIQUE: Multidetector CT imaging of the abdomen and pelvis was performed using the standard protocol following bolus administration of intravenous contrast. CONTRAST:  28mL OMNIPAQUE IOHEXOL 350 MG/ML SOLN COMPARISON:  None. FINDINGS: Lower chest: Small bilateral pleural effusions are noted with adjacent subsegmental atelectasis. Hepatobiliary: No focal liver abnormality is seen. Status post cholecystectomy. No biliary dilatation. Pancreas: Unremarkable. No pancreatic ductal dilatation or surrounding inflammatory changes. Spleen: Calcified splenic granulomata are noted. Adrenals/Urinary Tract: Adrenal glands are unremarkable. Kidneys are normal, without renal calculi, focal lesion, or hydronephrosis. Bladder is unremarkable. Stomach/Bowel: Stomach appears normal. There is no evidence of bowel obstruction or inflammation. Status post appendectomy. Sigmoid diverticulosis is noted without inflammation. Vascular/Lymphatic: Aortic atherosclerosis. Mildly enlarged retroperitoneal and mesenteric lymph nodes are  noted which may be inflammatory in etiology, but malignancy or metastatic disease cannot be excluded. Largest lymph node measures 13 mm in right periaortic region. Reproductive: Status post hysterectomy. 3.8 cm right ovarian cystic abnormality is noted. No definite left adnexal abnormality is noted. Other: Moderate ascites is noted. Mild irregular densities are seen involving the omentum concerning for possible peritoneal carcinomatosis. Musculoskeletal: No acute or significant osseous findings. IMPRESSION: Moderate ascites is noted. Mildly enlarged retroperitoneal mesenteric lymph nodes are noted, the largest measuring 13 mm in the right periaortic  region. While these may be inflammatory in etiology, malignancy or metastatic disease cannot be excluded. Mild irregular densities are seen involving the omentum which may represent inflammation, but peritoneal carcinomatosis cannot be excluded. Status post hysterectomy. 3.8 cm right ovarian cyst is noted. Recommend follow-up US in 6-12 months. Note: This recommendation does not apply to premenarchal patients and to those with increased risk (genetic, family history, elevated tumor markers or other high-risk factors) of ovarian cancer. Reference: JACR 2020 Feb; 17(2):248-254. Sigmoid diverticulosis without inflammation. Small bilateral pleural effusions with minimal adjacent subsegmental atelectasis. Aortic Atherosclerosis (ICD10-I70.0). Electronically Signed   By: Marijo Conception M.D.   On: 07/24/2021 15:43   US Paracentesis  Result Date: 07/25/2021 INDICATION: Concern for intra-abdominal malignancy, now with symptomatic ascites. Please perform ultrasound-guided paracentesis for diagnostic and therapeutic purposes. Max paracentesis volume of 5 L requested. EXAM: ULTRASOUND GUIDED DIAGNOSTIC AND THERAPEUTIC PARACENTESIS COMPARISON:  CT abdomen and pelvis-earlier same day MEDICATIONS: None. COMPLICATIONS: None immediate. TECHNIQUE: Informed written consent was  obtained from the patient after a discussion of the risks, benefits and alternatives to treatment. A timeout was performed prior to the initiation of the procedure. Initial ultrasound scanning demonstrates a large amount of ascites within the left lower abdomen which was subsequently prepped and draped in the usual sterile fashion. 1% lidocaine with epinephrine was used for local anesthesia. An ultrasound image was saved for documentation purposed. An 8 Fr Safe-T-Centesis catheter was introduced. The paracentesis was performed. The catheter was removed and a dressing was applied. The patient tolerated the procedure well without immediate post procedural complication. FINDINGS: A total of approximately 5 liters of serous fluid was removed. Samples were sent to the laboratory as requested by the clinical team. IMPRESSION: Successful ultrasound-guided paracentesis yielding 5 liters of peritoneal fluid. Electronically Signed   By: Sandi Mariscal M.D.   On: 07/25/2021 08:18     US PELVIS TRANSVAGINAL NON-OB (TV ONLY)  Result Date: 07/26/2021 CLINICAL DATA:  80 year old female inpatient with a history of hysterectomy and ascites and right adnexal cystic lesion on recent CT. EXAM: ULTRASOUND PELVIS TRANSVAGINAL TECHNIQUE: Transvaginal ultrasound examination of the pelvis was performed including evaluation of the uterus, ovaries, adnexal regions, and pelvic cul-de-sac. COMPARISON:  07/24/2021 CT abdomen/pelvis. FINDINGS: Uterus Surgically absent. Right ovary Measurements: 4.6 x 4.4 x 3.5 cm = volume: 37 mL. There is limited visualization of a 4.2 x 3.9 x 2.8 cm right ovarian cyst with questionable mildly thickened eccentric septation, and no internal vascularity on color Doppler or discrete mural nodularity. Left ovary Nonvisualization of the left ovary.  No left adnexal masses. Other findings:  No abnormal free fluid IMPRESSION: 1. Limited visualization of a 4.2 cm right ovarian cyst, which appears mildly complex with  mildly thickened eccentric septation. This cystic lesion is indeterminate for malignancy particularly given the other worrisome findings of large volume ascites and possible omental caking on the recent CT. 2. No abnormal findings at the hysterectomy margin. 3. Nonvisualization of the left ovary.  No left adnexal masses. Electronically Signed   By: Ilona Sorrel M.D.   On: 07/26/2021 12:59   CT Abdomen Pelvis W Contrast  Result Date: 07/24/2021 CLINICAL DATA:  Abdominal distension, nausea. EXAM: CT ABDOMEN AND PELVIS WITH CONTRAST TECHNIQUE: Multidetector CT imaging of the abdomen and pelvis was performed using the standard protocol following bolus administration of intravenous contrast. CONTRAST:  11mL OMNIPAQUE IOHEXOL 350 MG/ML SOLN COMPARISON:  None. FINDINGS: Lower chest: Small bilateral pleural effusions are noted with adjacent subsegmental atelectasis. Hepatobiliary:  No focal liver abnormality is seen. Status post cholecystectomy. No biliary dilatation. Pancreas: Unremarkable. No pancreatic ductal dilatation or surrounding inflammatory changes. Spleen: Calcified splenic granulomata are noted. Adrenals/Urinary Tract: Adrenal glands are unremarkable. Kidneys are normal, without renal calculi, focal lesion, or hydronephrosis. Bladder is unremarkable. Stomach/Bowel: Stomach appears normal. There is no evidence of bowel obstruction or inflammation. Status post appendectomy. Sigmoid diverticulosis is noted without inflammation. Vascular/Lymphatic: Aortic atherosclerosis. Mildly enlarged retroperitoneal and mesenteric lymph nodes are noted which may be inflammatory in etiology, but malignancy or metastatic disease cannot be excluded. Largest lymph node measures 13 mm in right periaortic region. Reproductive: Status post hysterectomy. 3.8 cm right ovarian cystic abnormality is noted. No definite left adnexal abnormality is noted. Other: Moderate ascites is noted. Mild irregular densities are seen involving the  omentum concerning for possible peritoneal carcinomatosis. Musculoskeletal: No acute or significant osseous findings. IMPRESSION: Moderate ascites is noted. Mildly enlarged retroperitoneal mesenteric lymph nodes are noted, the largest measuring 13 mm in the right periaortic region. While these may be inflammatory in etiology, malignancy or metastatic disease cannot be excluded. Mild irregular densities are seen involving the omentum which may represent inflammation, but peritoneal carcinomatosis cannot be excluded. Status post hysterectomy. 3.8 cm right ovarian cyst is noted. Recommend follow-up US in 6-12 months. Note: This recommendation does not apply to premenarchal patients and to those with increased risk (genetic, family history, elevated tumor markers or other high-risk factors) of ovarian cancer. Reference: JACR 2020 Feb; 17(2):248-254. Sigmoid diverticulosis without inflammation. Small bilateral pleural effusions with minimal adjacent subsegmental atelectasis. Aortic Atherosclerosis (ICD10-I70.0). Electronically Signed   By: Marijo Conception M.D.   On: 07/24/2021 15:43   US Paracentesis  Result Date: 07/25/2021 INDICATION: Concern for intra-abdominal malignancy, now with symptomatic ascites. Please perform ultrasound-guided paracentesis for diagnostic and therapeutic purposes. Max paracentesis volume of 5 L requested. EXAM: ULTRASOUND GUIDED DIAGNOSTIC AND THERAPEUTIC PARACENTESIS COMPARISON:  CT abdomen and pelvis-earlier same day MEDICATIONS: None. COMPLICATIONS: None immediate. TECHNIQUE: Informed written consent was obtained from the patient after a discussion of the risks, benefits and alternatives to treatment. A timeout was performed prior to the initiation of the procedure. Initial ultrasound scanning demonstrates a large amount of ascites within the left lower abdomen which was subsequently prepped and draped in the usual sterile fashion. 1% lidocaine with epinephrine was used for local  anesthesia. An ultrasound image was saved for documentation purposed. An 8 Fr Safe-T-Centesis catheter was introduced. The paracentesis was performed. The catheter was removed and a dressing was applied. The patient tolerated the procedure well without immediate post procedural complication. FINDINGS: A total of approximately 5 liters of serous fluid was removed. Samples were sent to the laboratory as requested by the clinical team. IMPRESSION: Successful ultrasound-guided paracentesis yielding 5 liters of peritoneal fluid. Electronically Signed   By: Sandi Mariscal M.D.   On: 07/25/2021 08:18    Assessment and Plan:  1.  Retroperitoneal lymphadenopathy and possible peritoneal carcinomatosis -07/24/2021 CT of the abdomen/pelvis with contrast- "Moderate ascites is noted. Mildly enlarged retroperitoneal mesenteric lymph nodes are noted, the largest measuring 13 mm in the right periaortic region. While these may be inflammatory in etiology, malignancy or metastatic disease cannot be excluded. Mild irregular densities are seen involving the omentum which may represent inflammation, but peritoneal carcinomatosis cannot be excluded. Status post hysterectomy. 3.8 cm right ovarian cyst is noted. Recommend follow-up US in 6-12 months. Note: This recommendation does not apply to premenarchal patients and to those with increased risk (genetic, family  history, elevated tumor markers or other high-risk factors) of ovarian cancer. Reference: JACR 2020 Feb; 17(2):248-254. Sigmoid diverticulosis without inflammation. Small bilateral pleural effusions with minimal adjacent subsegmental atelectasis. Aortic Atherosclerosis (ICD10-I70.0)." -07/26/2021 transvaginal ultrasound- "1. Limited visualization of a 4.2 cm right ovarian cyst, which appears mildly complex with mildly thickened eccentric septation. This cystic lesion is indeterminate for malignancy particularly given the other worrisome findings of large volume ascites and  possible omental caking on the recent CT. 2. No abnormal findings at the hysterectomy margin.3. Nonvisualization of the left ovary.  No left adnexal masses."  2.  Ascites -07/24/2021 ultrasound-guided paracentesis with 5 L of fluid removed  3.  Abdominal pain  4.  Protein calorie malnutrition  5.  Hypertension  6.  Hyperlipidemia  7.  Type II diabetes mellitus  -Reviewed the findings to date with the patient and her daughters.  We discussed that findings are concerning for possible malignancy likely of GYN origin.  We discussed awaiting cytology results from the paracentesis she had over the weekend.  We will also await the tumor markers.  Once we have additional information, we will have additional discussions regarding any additional work-up and treatment options.  Thank you for this referral.   Mikey Bussing, DNP, AGPCNP-BC, AOCNP   ADDENDUM  .Patient was Personally and independently interviewed, examined and relevant elements of the history of present illness were reviewed in details and an assessment and plan was created. All elements of the patient's history of present illness , assessment and plan were discussed in details with Mikey Bussing, DNP, AGPCNP-BC, AOCNP . The above documentation reflects our combined findings assessment and plan.   CT chest abdomen pelvis 07/24/21- Moderate ascites is noted.   Mildly enlarged retroperitoneal mesenteric lymph nodes are noted, the largest measuring 13 mm in the right periaortic region. While these may be inflammatory in etiology, malignancy or metastatic disease cannot be excluded.   Mild irregular densities are seen involving the omentum which may represent inflammation, but peritoneal carcinomatosis cannot be excluded.   Status post hysterectomy. 3.8 cm right ovarian cyst is noted.  US Pelvis 07/26/2021- 1. Limited visualization of a 4.2 cm right ovarian cyst, which appears mildly complex with mildly thickened eccentric  septation. This cystic lesion is indeterminate for malignancy particularly given the other worrisome findings of large volume ascites and possible omental caking on the recent CT. 2. No abnormal findings at the hysterectomy margin. 3. Nonvisualization of the left ovary.  No left adnexal masses.    Component     Latest Ref Rng & Units 07/26/2021  CEA     0.0 - 4.7 ng/mL 32.7 (H)  CA 19-9     0 - 35 U/mL 2,040 (H)  Cancer Antigen (CA) 125     0.0 - 38.1 U/mL 124.0 (H)    Assessment   80 year old female with   1) 4.2 cm right ovarian complex cyst with new ascites and potential peritoneal carcinomatosis. Elevated CA125 level of 124. 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 PLAN -Labs and imaging studies reviewed with the patient -Patient notes some improvement in her abdominal discomfort after therapeutic paracentesis.  -Cytology from paracentesis still pending -Would recommend CT of the chest to complete staging work-up. -Would recommend consulting GYN oncology for concern regarding possible ovarian cancer.  [name] MD MS   ADDENDUM I called and update patients  daughter Dr. Particia Jasper at patients request no 10/11 and answered her questions.  Pending Cytology results   [name]

## 2021-07-28 DIAGNOSIS — N83201 Unspecified ovarian cyst, right side: Secondary | ICD-10-CM

## 2021-07-28 DIAGNOSIS — R180 Malignant ascites: Secondary | ICD-10-CM | POA: Diagnosis not present

## 2021-07-28 LAB — COMPREHENSIVE METABOLIC PANEL
ALT: 8 U/L (ref 0–44)
AST: 15 U/L (ref 15–41)
Albumin: 2.4 g/dL — ABNORMAL LOW (ref 3.5–5.0)
Alkaline Phosphatase: 82 U/L (ref 38–126)
Anion gap: 9 (ref 5–15)
BUN: 15 mg/dL (ref 8–23)
CO2: 28 mmol/L (ref 22–32)
Calcium: 8.4 mg/dL — ABNORMAL LOW (ref 8.9–10.3)
Chloride: 96 mmol/L — ABNORMAL LOW (ref 98–111)
Creatinine, Ser: 0.8 mg/dL (ref 0.44–1.00)
GFR, Estimated: >60 mL/min (ref >60)
Glucose, Bld: 128 mg/dL — ABNORMAL HIGH (ref 70–99)
Potassium: 4.3 mmol/L (ref 3.5–5.1)
Sodium: 133 mmol/L — ABNORMAL LOW (ref 135–145)
Total Bilirubin: 0.7 mg/dL (ref 0.3–1.2)
Total Protein: 5.4 g/dL — ABNORMAL LOW (ref 6.5–8.1)

## 2021-07-28 LAB — CBC
HCT: 42.9 % (ref 36.0–46.0)
Hemoglobin: 13.7 g/dL (ref 12.0–15.0)
MCH: 27.2 pg (ref 26.0–34.0)
MCHC: 31.9 g/dL (ref 30.0–36.0)
MCV: 85.3 fL (ref 80.0–100.0)
Platelets: 410 10*3/uL — ABNORMAL HIGH (ref 150–400)
RBC: 5.03 MIL/uL (ref 3.87–5.11)
RDW: 15.8 % — ABNORMAL HIGH (ref 11.5–15.5)
WBC: 9 10*3/uL (ref 4.0–10.5)
nRBC: 0 % (ref 0.0–0.2)

## 2021-07-28 MED ORDER — LIP MEDEX EX OINT
TOPICAL_OINTMENT | CUTANEOUS | Status: DC | PRN
  Filled 2021-07-28: qty 7

## 2021-07-28 NOTE — Progress Notes (Signed)
PROGRESS NOTE    Amanda Gordon  BJY:[id number] DOB: 13-Nov-1940 DOA: 07/24/2021 PCP: System, Provider Not In   Brief Narrative:  Amanda Gordon is a 80 y.o. female with medical history significant of type 2 diabetes, unspecified anemia, hyperlipidemia, hypertension, osteoarthritis of the left knee, type 2 diabetes mellitus, class I obesity who is coming to the emergency department due to abdominal distention after first losing about 22 pounds in the month and then regaining 17 pounds with profound abdominal distention.  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 -Electrolytes currently well controlled.   Hypokalemia - Currently WNL.   Hyponatremia, hypovolemic, resolving - Improving with PO intake   Moderate protein malnutrition (HCC) - In the setting of abdominal ascites - Poor PO intake - improving now s/p ascites removal   Hypertension - Resume lisinopril 20 mg p.o. daily. - Resume metoprolol succinate 100 mg p.o. bedtime.   Hyperlipidemia - Currently not on medical therapy. - Follow-up with PCP to discuss treatment options.   Type 2 diabetes mellitus (HCC) - Carbohydrate modified diet. - CBG monitoring with RI SS.  DVT prophylaxis: SCDs. Code Status: Full. Family Communication: Daughter at bedside  Status is: Inpt  Dispo:  The patient is from: Home              Anticipated d/c is to: Home              Anticipated d/c date is: 24-48h pending further imaging and work-up per oncology              Patient currently not medically stable for discharge  Consultants:  None  Procedures:  Paracentesis 07/24/21  Antimicrobials:  None   Subjective: No acute issues/events overnight, abdominal pain moderately improving with new medication, denies nausea vomiting diarrhea constipation headache fevers chills or chest pain  Objective: Vitals:   07/27/21 1016 07/27/21 1443 07/27/21 2026 07/28/21 0255  BP: 113/76 96/68 92/61  106/74  Pulse:  79 81 86  Resp:  18 18 18   Temp:  97.6 F (36.4 C) 97.8 F (36.6 C) 97.8 F (36.6 C)  TempSrc:  Oral Oral Oral  SpO2:  96% 94% 95%  Weight:      Height:        Intake/Output Summary (Last 24 hours) at 07/28/2021 0746 Last data filed at 07/28/2021 0300 Gross per 24 hour  Intake 360 ml  Output 2150 ml  Net -1790 ml    Filed Weights   07/24/21 1527 07/25/21 0011  Weight: 79.2 kg 75.1 kg    Examination:  General exam: Appears calm and comfortable  Respiratory system: Clear to auscultation. Respiratory effort normal. Cardiovascular system: S1 & S2 heard, RRR. No JVD, murmurs, rubs, gallops or clicks. No pedal edema. Gastrointestinal system: Abdomen is minimally distended Central nervous system: Alert and oriented. No focal neurological deficits. Extremities: Symmetric 5 x 5 power. Skin: No rashes, lesions or ulcers Psychiatry:  Judgement and insight appear normal. Mood & affect appropriate.   Data Reviewed: I have personally reviewed following labs and imaging studies  CBC: Recent Labs  Lab 07/23/21 1429 07/24/21 1225 07/25/21 0347 07/26/21 0347 07/27/21 0348 07/28/21 0328  WBC 10.2 10.0 7.9 8.7 8.6 9.0  NEUTROABS 8.0* 8.2*  --   --   --   --   HGB 14.4 14.3 11.9* 13.1 12.9 13.7  HCT 45.2 44.6 36.4 40.6 39.1 42.9  MCV 85.4 85.0 84.3 84.6 83.5 85.3  PLT  512* 477* 349 415* 382 410*    Basic Metabolic Panel: Recent Labs  Lab 07/24/21 1225 07/24/21 1640 07/25/21 0347 07/26/21 0347 07/27/21 0348 07/28/21 0328  NA 133*  --  140 133* 139 133*  K 3.4*  --  3.2* 3.2* 4.8 4.3  CL 90*  --  98 94* 102 96*  CO2 31  --  30 30 28 28   GLUCOSE 134*  --  89 105* 92 128*  BUN 24*  --  18 14 14 15   CREATININE 0.76  --  0.66 0.70 0.62 0.80  CALCIUM 9.3  --  8.6* 8.5* 8.8* 8.4*  MG  --  1.9  --   --   --   --   PHOS  --  4.0  --   --   --   --     GFR: Estimated Creatinine Clearance: 54.1 mL/min (by C-G formula based on SCr of 0.8 mg/dL). Liver Function Tests: Recent Labs  Lab 07/24/21 1225 07/25/21 0347 07/26/21 0347 07/27/21 0348 07/28/21 0328  AST 17 15 17  13* 15  ALT 9 8 8 8 8   ALKPHOS 93 72 79 80 82  BILITOT 0.5 0.6 0.7 0.4 0.7  PROT 6.7 5.1* 5.5* 5.2* 5.4*  ALBUMIN 3.3* 2.5* 2.6* 2.4* 2.4*    Recent Labs  Lab 07/24/21 1225  LIPASE 18    No results for input(s): AMMONIA in the last 168 hours. Coagulation Profile: No results for input(s): INR, PROTIME in the last 168 hours. Cardiac Enzymes: No results for input(s): CKTOTAL, CKMB, CKMBINDEX, TROPONINI in the last 168 hours. BNP (last 3 results) No results for input(s): PROBNP in the last 8760 hours. HbA1C: No results for input(s): HGBA1C in the last 72 hours.  CBG: Recent Labs  Lab 07/23/21 1359  GLUCAP 137*    Lipid Profile: No results for input(s): CHOL, HDL, LDLCALC, TRIG, CHOLHDL, LDLDIRECT in the last 72 hours. Thyroid Function Tests: No results for input(s): TSH, T4TOTAL, FREET4, T3FREE, THYROIDAB in the last 72 hours. Anemia Panel: No results for input(s): VITAMINB12, FOLATE, FERRITIN, TIBC, IRON, RETICCTPCT in the last 72 hours. Sepsis Labs: No results for input(s): PROCALCITON, LATICACIDVEN in the last 168 hours.  Recent Results (from the past 240 hour(s))  Surgical pcr screen     Status: None   Collection Time: 07/23/21  2:29 PM   Specimen: Nasal  Mucosa; Nasal Swab  Result Value Ref Range Status   MRSA, PCR NEGATIVE NEGATIVE Final   Staphylococcus aureus NEGATIVE NEGATIVE Final    Comment: (NOTE) The Xpert SA Assay (FDA approved for NASAL specimens in patients 36 years of age and older), is one component of a comprehensive surveillance program. It is not intended to diagnose infection nor to guide or monitor treatment. Performed at [facility], [place] [address] [place], [place] [address]   Urine Culture     Status: Abnormal   Collection Time: 07/23/21  2:29 PM   Specimen: Urine,  Clean Catch  Result Value Ref Range Status   Specimen Description   Final    URINE, CLEAN CATCH Performed at [facility], [place] [address] [place], [place] [address]    Special Requests   Final    NONE Performed at [facility], [place] [address], [place], [place] [address]    Culture (A)  Final    <10,000 COLONIES/mL INSIGNIFICANT GROWTH Performed at [place] [address], [place], [place] [address]    Report Status 07/24/2021 FINAL  Final  Resp Panel by RT-PCR (Flu A&B, Covid) Nasopharyngeal Swab     Status: None   Collection Time: 07/24/21  4:16 PM   Specimen: Nasopharyngeal Swab; Nasopharyngeal(NP) swabs in vial transport medium  Result Value Ref Range Status   SARS Coronavirus 2 by RT PCR NEGATIVE NEGATIVE Final    Comment: (NOTE) SARS-CoV-2 target nucleic acids are NOT DETECTED.  The SARS-CoV-2 RNA is generally detectable in upper respiratory specimens during the acute phase of infection. The lowest concentration of SARS-CoV-2 viral copies this assay can detect is 138 copies/mL. A negative result does not preclude SARS-Cov-2 infection and should not be used as the sole basis for treatment or other patient management decisions. A negative result may occur with  improper specimen collection/handling, submission of specimen other than nasopharyngeal swab, presence of  viral mutation(s) within the areas targeted by this assay, and inadequate number of viral copies(<138 copies/mL). A negative result must be combined with clinical observations, patient history, and epidemiological information. The expected result is Negative.  Fact Sheet for Patients:  EntrepreneurPulse.com.au  Fact Sheet for Healthcare Providers:  IncredibleEmployment.be  This test is no t yet approved or cleared by the [place] FDA and  has been authorized for detection and/or diagnosis of SARS-CoV-2 by FDA under an Emergency Use Authorization (EUA). This EUA will remain  in effect (meaning this test can be used) for the duration of the COVID-19 declaration under Section 564(b)(1) of the Act, 21 U.S.C.section 360bbb-3(b)(1), unless the authorization is terminated  or revoked sooner.       Influenza A by PCR NEGATIVE NEGATIVE Final   Influenza B by PCR NEGATIVE NEGATIVE Final    Comment: (NOTE) The Xpert Xpress SARS-CoV-2/FLU/RSV plus assay is intended as an aid in the diagnosis of influenza from Nasopharyngeal swab specimens and should not be used as a sole basis for treatment. Nasal washings and aspirates are unacceptable for Xpert Xpress SARS-CoV-2/FLU/RSV testing.  Fact Sheet for Patients: EntrepreneurPulse.com.au  Fact Sheet for Healthcare Providers: IncredibleEmployment.be  This test is not yet approved or cleared by the [place] FDA and has been authorized for detection and/or diagnosis of SARS-CoV-2 by FDA under an Emergency Use Authorization (EUA). This EUA will remain in effect (meaning this test can be used) for the duration of the COVID-19 declaration under Section 564(b)(1) of the Act, 21 U.S.C. section 360bbb-3(b)(1), unless the authorization is terminated or revoked.  Performed at [facility], [place] [address], [place],  [address]     Radiology  Studies: US PELVIS TRANSVAGINAL NON-OB (TV ONLY)  Result Date: 07/26/2021 CLINICAL DATA:  80 year old female inpatient with a history of hysterectomy and ascites and right adnexal cystic lesion on recent CT. EXAM: ULTRASOUND PELVIS TRANSVAGINAL TECHNIQUE: Transvaginal ultrasound examination of the pelvis was performed including evaluation of the uterus, ovaries, adnexal regions, and pelvic cul-de-sac. COMPARISON:  07/24/2021 CT abdomen/pelvis. FINDINGS: Uterus Surgically absent. Right ovary Measurements: 4.6 x 4.4 x 3.5 cm =  volume: 37 mL. There is limited visualization of a 4.2 x 3.9 x 2.8 cm right ovarian cyst with questionable mildly thickened eccentric septation, and no internal vascularity on color Doppler or discrete mural nodularity. Left ovary Nonvisualization of the left ovary.  No left adnexal masses. Other findings:  No abnormal free fluid IMPRESSION: 1. Limited visualization of a 4.2 cm right ovarian cyst, which appears mildly complex with mildly thickened eccentric septation. This cystic lesion is indeterminate for malignancy particularly given the other worrisome findings of large volume ascites and possible omental caking on the recent CT. 2. No abnormal findings at the hysterectomy margin. 3. Nonvisualization of the left ovary.  No left adnexal masses. Electronically Signed   By: Ilona Sorrel M.D.   On: 07/26/2021 12:59   [place] ASCITES (ABDOMEN LIMITED)  Result Date: 07/27/2021 CLINICAL DATA:  Patient is complaining of leaking at previous paracentesis puncture site. EXAM: LIMITED ABDOMEN ULTRASOUND FOR ASCITES TECHNIQUE: Limited ultrasound survey for ascites was performed in all four abdominal quadrants. COMPARISON:  07/24/2021 FINDINGS: Small to moderate amount of ascites in the right upper quadrant and right lower quadrant. Minimal ascites in left upper quadrant. No significant ascites in left lower quadrant. IMPRESSION: Small to moderate amount of ascites in the right abdomen. Paracentesis  not performed. Electronically Signed   By: Markus Daft M.D.   On: 07/27/2021 13:32    Scheduled Meds:  amLODipine  10 mg Oral Daily   chlorhexidine  15 mL Mouth Rinse BID   feeding supplement  1 Container Oral TID BM   furosemide  40 mg Intravenous BID   lisinopril  20 mg Oral Daily   mouth rinse  15 mL Mouth Rinse q12n4p   metoprolol succinate  100 mg Oral QHS   multivitamin with minerals  1 tablet Oral Daily   pantoprazole  40 mg Oral Daily   polyethylene glycol  17 g Oral Daily     LOS: 2 days   Time spent: 54min  Thorn Demas C Hilliard Borges, DO [facility]  If 7PM-7AM, please contact night-coverage [email or web address]  07/28/2021, 7:46 AM

## 2021-07-29 ENCOUNTER — Inpatient Hospital Stay (HOSPITAL_COMMUNITY): Payer: Medicare Other

## 2021-07-29 DIAGNOSIS — E441 Mild protein-calorie malnutrition: Secondary | ICD-10-CM

## 2021-07-29 DIAGNOSIS — E871 Hypo-osmolality and hyponatremia: Secondary | ICD-10-CM

## 2021-07-29 DIAGNOSIS — N83201 Unspecified ovarian cyst, right side: Secondary | ICD-10-CM | POA: Diagnosis not present

## 2021-07-29 DIAGNOSIS — R9431 Abnormal electrocardiogram [ECG] [EKG]: Secondary | ICD-10-CM

## 2021-07-29 DIAGNOSIS — E876 Hypokalemia: Secondary | ICD-10-CM

## 2021-07-29 DIAGNOSIS — R188 Other ascites: Secondary | ICD-10-CM | POA: Diagnosis not present

## 2021-07-29 DIAGNOSIS — C800 Disseminated malignant neoplasm, unspecified: Secondary | ICD-10-CM | POA: Diagnosis not present

## 2021-07-29 DIAGNOSIS — R180 Malignant ascites: Secondary | ICD-10-CM | POA: Diagnosis not present

## 2021-07-29 LAB — COMPREHENSIVE METABOLIC PANEL
ALT: 8 U/L (ref 0–44)
AST: 17 U/L (ref 15–41)
Albumin: 2.4 g/dL — ABNORMAL LOW (ref 3.5–5.0)
Alkaline Phosphatase: 81 U/L (ref 38–126)
Anion gap: 11 (ref 5–15)
BUN: 20 mg/dL (ref 8–23)
CO2: 26 mmol/L (ref 22–32)
Calcium: 8.7 mg/dL — ABNORMAL LOW (ref 8.9–10.3)
Chloride: 96 mmol/L — ABNORMAL LOW (ref 98–111)
Creatinine, Ser: 0.74 mg/dL (ref 0.44–1.00)
GFR, Estimated: >60 mL/min (ref >60)
Glucose, Bld: 137 mg/dL — ABNORMAL HIGH (ref 70–99)
Potassium: 4.2 mmol/L (ref 3.5–5.1)
Sodium: 133 mmol/L — ABNORMAL LOW (ref 135–145)
Total Bilirubin: 0.5 mg/dL (ref 0.3–1.2)
Total Protein: 5.7 g/dL — ABNORMAL LOW (ref 6.5–8.1)

## 2021-07-29 LAB — CBC
HCT: 41.7 % (ref 36.0–46.0)
Hemoglobin: 13.5 g/dL (ref 12.0–15.0)
MCH: 27.4 pg (ref 26.0–34.0)
MCHC: 32.4 g/dL (ref 30.0–36.0)
MCV: 84.6 fL (ref 80.0–100.0)
Platelets: 441 10*3/uL — ABNORMAL HIGH (ref 150–400)
RBC: 4.93 MIL/uL (ref 3.87–5.11)
RDW: 15.8 % — ABNORMAL HIGH (ref 11.5–15.5)
WBC: 10.4 10*3/uL (ref 4.0–10.5)
nRBC: 0 % (ref 0.0–0.2)

## 2021-07-29 LAB — GLUCOSE, CAPILLARY
Glucose-Capillary: 132 mg/dL — ABNORMAL HIGH (ref 70–99)
Glucose-Capillary: 132 mg/dL — ABNORMAL HIGH (ref 70–99)
Glucose-Capillary: 141 mg/dL — ABNORMAL HIGH (ref 70–99)
Glucose-Capillary: 125 mg/dL — ABNORMAL HIGH (ref 70–99)

## 2021-07-29 MED ORDER — ONDANSETRON 4 MG PO TBDP
4.0000 mg | ORAL_TABLET | Freq: Three times a day (TID) | ORAL | Status: DC | PRN
  Administered 2021-07-29 – 2021-08-03 (×9): 4 mg via ORAL
  Filled 2021-07-29 (×11): qty 1

## 2021-07-29 MED ORDER — INSULIN ASPART 100 UNIT/ML IJ SOLN
0.0000 [IU] | Freq: Three times a day (TID) | INTRAMUSCULAR | Status: DC
  Administered 2021-07-29 (×3): 1 [IU] via SUBCUTANEOUS
  Administered 2021-07-30: 2 [IU] via SUBCUTANEOUS
  Administered 2021-07-31 – 2021-08-04 (×7): 1 [IU] via SUBCUTANEOUS
  Administered 2021-08-04 (×2): 2 [IU] via SUBCUTANEOUS
  Administered 2021-08-05: 1 [IU] via SUBCUTANEOUS

## 2021-07-29 MED ORDER — IOHEXOL 350 MG/ML SOLN
75.0000 mL | Freq: Once | INTRAVENOUS | Status: AC | PRN
  Administered 2021-07-29: 60 mL via INTRAVENOUS

## 2021-07-29 MED ORDER — INSULIN ASPART 100 UNIT/ML IJ SOLN: 0.0000 [IU] | Freq: Every day | INTRAMUSCULAR | Status: DC

## 2021-07-29 NOTE — Progress Notes (Signed)
PROGRESS NOTE    Amanda Gordon  DEY:[id number] DOB: 1941/05/25 DOA: 07/24/2021 PCP: System, Provider Not In   Brief Narrative:  Amanda Gordon is a 80 y.o. female with medical history significant of type 2 diabetes, unspecified anemia, hyperlipidemia, hypertension, osteoarthritis of the left knee, type 2 diabetes mellitus, class I obesity presented to the hospital with abdominal distention weight loss with dry history constipation and episode of hematochezia.  CT scan of the abdomen and pelvis showed ascites with enlarged retroperitoneal lymph nodes so patient was admitted hospital for further evaluation and treatment..    Assessment & Plan:   Abdominal pain nausea with ascites.  Had concerns for carcinomatosis and had been seen by GI and oncology.  CEA CA 19-9 and CA125 elevated.  Transvaginal ultrasound showed 4.62 cm right ovarian cyst somewhat complex indeterminate for malignancy.  Cytology pending from paracentesis.  Could consider repeat paracentesis if worsening distention and pain.  On IV Lasix for some diuresis.  I spoke with Dr. Berline Lopes GYN oncology who requested cytology of the fluid.  Medical oncology on board currently.  Borderline prolonged QT interval Continue telemetry.  Continue to replenish electrolytes as necessary.   Hypokalemia Improved after replacement.  Check levels in a.m.   Hyponatremia, hypovolemic,  Improving.   Mild protein malnutrition  Likely secondary to underlying malignancy.  Oral nutrition.   Hypertension Continue lisinopril metoprolol   Hyperlipidemia Not on treatment.  Type 2 diabetes mellitus  Continue sliding scale insulin, Accu-Cheks, diabetic diet.  DVT prophylaxis: SCDs.  Code Status: Full code  Family Communication:  Spoke with the patient's family at bedside.  Status is: Inpt  Dispo: The patient is from: Home              Anticipated d/c is to: Home              Patient currently not medically stable for  discharge  Consultants:  Patient radiology Oncology  Procedures:  Paracentesis 07/24/21  Antimicrobials:  None   Subjective: Today, patient was seen and examined at bedside.  Complains of mild pain over the right side of the abdomen with mild distention.  Still complains of nausea.  Has had bowel movements.  Objective: Vitals:   07/28/21 1504 07/28/21 2024 07/29/21 0501 07/29/21 1028  BP: 125/78 111/83 114/88 109/80  Pulse: 97 (!) 108 83 84  Resp:  18 18   Temp: 98 F (36.7 C) 97.9 F (36.6 C) 97.7 F (36.5 C)   TempSrc: Oral Oral Oral   SpO2: 94% 95% 97% 93%  Weight:      Height:        Intake/Output Summary (Last 24 hours) at 07/29/2021 1126 Last data filed at 07/29/2021 1037 Gross per 24 hour  Intake 240 ml  Output 1700 ml  Net -1460 ml    Filed Weights   07/24/21 1527 07/25/21 0011  Weight: 79.2 kg 75.1 kg    Physical examination: General:  Average built, not in obvious distress HENT:   No scleral pallor or icterus noted. Oral mucosa is moist.  Chest:  Clear breath sounds.  Diminished breath sounds bilaterally. No crackles or wheezes.  CVS: S1 &S2 heard. No murmur.  Regular rate and rhythm. Abdomen: Soft, nontender, distended abdomen with ascites with mild tenderness over the right quadrant.  Bowel sounds are heard.   Extremities: No cyanosis, clubbing or edema.  Peripheral pulses are palpable. Psych: Alert, awake and oriented, normal mood CNS:  No cranial nerve deficits.  Power equal  in all extremities.   Skin: Warm and dry.  No rashes noted.   Data Reviewed: I have personally reviewed the following labs and imaging studies.    CBC: Recent Labs  Lab 07/23/21 1429 07/24/21 1225 07/25/21 0347 07/26/21 0347 07/27/21 0348 07/28/21 0328 07/29/21 0337  WBC 10.2 10.0 7.9 8.7 8.6 9.0 10.4  NEUTROABS 8.0* 8.2*  --   --   --   --   --   HGB 14.4 14.3 11.9* 13.1 12.9 13.7 13.5  HCT 45.2 44.6 36.4 40.6 39.1 42.9 41.7  MCV 85.4 85.0 84.3 84.6 83.5 85.3  84.6  PLT 512* 477* 349 415* 382 410* 441*    Basic Metabolic Panel: Recent Labs  Lab 07/24/21 1640 07/25/21 0347 07/26/21 0347 07/27/21 0348 07/28/21 0328 07/29/21 0337  NA  --  140 133* 139 133* 133*  K  --  3.2* 3.2* 4.8 4.3 4.2  CL  --  98 94* 102 96* 96*  CO2  --  30 30 28 28 26   GLUCOSE  --  89 105* 92 128* 137*  BUN  --  18 14 14 15 20   CREATININE  --  0.66 0.70 0.62 0.80 0.74  CALCIUM  --  8.6* 8.5* 8.8* 8.4* 8.7*  MG 1.9  --   --   --   --   --   PHOS 4.0  --   --   --   --   --     GFR: Estimated Creatinine Clearance: 54.1 mL/min (by C-G formula based on SCr of 0.74 mg/dL). Liver Function Tests: Recent Labs  Lab 07/25/21 0347 07/26/21 0347 07/27/21 0348 07/28/21 0328 07/29/21 0337  AST 15 17 13* 15 17  ALT 8 8 8 8 8   ALKPHOS 72 79 80 82 81  BILITOT 0.6 0.7 0.4 0.7 0.5  PROT 5.1* 5.5* 5.2* 5.4* 5.7*  ALBUMIN 2.5* 2.6* 2.4* 2.4* 2.4*    Recent Labs  Lab 07/24/21 1225  LIPASE 18    No results for input(s): AMMONIA in the last 168 hours. Coagulation Profile: No results for input(s): INR, PROTIME in the last 168 hours. Cardiac Enzymes: No results for input(s): CKTOTAL, CKMB, CKMBINDEX, TROPONINI in the last 168 hours. BNP (last 3 results) No results for input(s): PROBNP in the last 8760 hours. HbA1C: No results for input(s): HGBA1C in the last 72 hours.  CBG: Recent Labs  Lab 07/23/21 1359 07/29/21 0755  GLUCAP 137* 132*    Lipid Profile: No results for input(s): CHOL, HDL, LDLCALC, TRIG, CHOLHDL, LDLDIRECT in the last 72 hours. Thyroid Function Tests: No results for input(s): TSH, T4TOTAL, FREET4, T3FREE, THYROIDAB in the last 72 hours. Anemia Panel: No results for input(s): VITAMINB12, FOLATE, FERRITIN, TIBC, IRON, RETICCTPCT in the last 72 hours. Sepsis Labs: No results for input(s): PROCALCITON, LATICACIDVEN in the last 168 hours.  Recent Results (from the past 240 hour(s))  Surgical pcr screen     Status: None   Collection Time:  07/23/21  2:29 PM   Specimen: Nasal Mucosa; Nasal Swab  Result Value Ref Range Status   MRSA, PCR NEGATIVE NEGATIVE Final   Staphylococcus aureus NEGATIVE NEGATIVE Final    Comment: (NOTE) The Xpert SA Assay (FDA approved for NASAL specimens in patients 75 years of age and older), is one component of a comprehensive surveillance program. It is not intended to diagnose infection nor to guide or monitor treatment. Performed at [facility], [place] [address] [place], [place] [address]  Urine Culture     Status: Abnormal   Collection Time: 07/23/21  2:29 PM   Specimen: Urine, Clean Catch  Result Value Ref Range Status   Specimen Description   Final    URINE, CLEAN CATCH Performed at [facility], [place] [address] [place], [place] [address]    Special Requests   Final    NONE Performed at [facility], [place] [address] [place], [place] [address]    Culture (A)  Final    <10,000 COLONIES/mL INSIGNIFICANT GROWTH Performed at [place] [address] [place], [place] [address]    Report Status 07/24/2021 FINAL  Final  Resp Panel by RT-PCR (Flu A&B, Covid) Nasopharyngeal Swab     Status: None   Collection Time: 07/24/21  4:16 PM   Specimen: Nasopharyngeal Swab; Nasopharyngeal(NP) swabs in vial transport medium  Result Value Ref Range Status   SARS Coronavirus 2 by RT PCR NEGATIVE NEGATIVE Final    Comment: (NOTE) SARS-CoV-2 target nucleic acids are NOT DETECTED.  The SARS-CoV-2 RNA is generally detectable in upper respiratory specimens during the acute phase of infection. The lowest concentration of SARS-CoV-2 viral copies this assay can detect is 138 copies/mL. A negative result does not preclude SARS-Cov-2 infection and should not be used as the sole basis for treatment or other patient management decisions. A negative result may occur with  improper specimen collection/handling, submission of specimen other than  nasopharyngeal swab, presence of viral mutation(s) within the areas targeted by this assay, and inadequate number of viral copies(<138 copies/mL). A negative result must be combined with clinical observations, patient history, and epidemiological information. The expected result is Negative.  Fact Sheet for Patients:  EntrepreneurPulse.com.au  Fact Sheet for Healthcare Providers:  IncredibleEmployment.be  This test is no t yet approved or cleared by the [place] FDA and  has been authorized for detection and/or diagnosis of SARS-CoV-2 by FDA under an Emergency Use Authorization (EUA). This EUA will remain  in effect (meaning this test can be used) for the duration of the COVID-19 declaration under Section 564(b)(1) of the Act, 21 U.S.C.section 360bbb-3(b)(1), unless the authorization is terminated  or revoked sooner.       Influenza A by PCR NEGATIVE NEGATIVE Final   Influenza B by PCR NEGATIVE NEGATIVE Final    Comment: (NOTE) The Xpert Xpress SARS-CoV-2/FLU/RSV plus assay is intended as an aid in the diagnosis of influenza from Nasopharyngeal swab specimens and should not be used as a sole basis for treatment. Nasal washings and aspirates are unacceptable for Xpert Xpress SARS-CoV-2/FLU/RSV testing.  Fact Sheet for Patients: EntrepreneurPulse.com.au  Fact Sheet for Healthcare Providers: IncredibleEmployment.be  This test is not yet approved or cleared by the [place] FDA and has been authorized for detection and/or diagnosis of SARS-CoV-2 by FDA under an Emergency Use Authorization (EUA). This EUA will remain in effect (meaning this test can be used) for the duration of the COVID-19 declaration under Section 564(b)(1) of the Act, 21 U.S.C. section 360bbb-3(b)(1), unless the authorization is terminated or revoked.  Performed at [facility], [place] [address], [place], [place] [address]      Radiology Studies: No results found.  Scheduled Meds:  amLODipine  10 mg Oral Daily   chlorhexidine  15 mL Mouth Rinse BID   feeding supplement  1 Container Oral TID BM   furosemide  40 mg Intravenous BID   insulin aspart  0-5 Units Subcutaneous QHS   insulin  aspart  0-9 Units Subcutaneous TID WC   lisinopril  20 mg Oral Daily   mouth rinse  15 mL Mouth Rinse q12n4p   metoprolol succinate  100 mg Oral QHS   multivitamin with minerals  1 tablet Oral Daily   pantoprazole  40 mg Oral Daily   polyethylene glycol  17 g Oral Daily   Continuous Infusions:   LOS: 3 days    Flora Lipps, MD [facility] If 7PM-7AM, please contact night-coverage [email or web address]  07/29/2021, 11:26 AM

## 2021-07-29 NOTE — Plan of Care (Signed)

## 2021-07-29 NOTE — Progress Notes (Signed)
rm 1423, Amanda Gordon. c/o dizziness.  BP is 92/63 P-85 was 89/69 P-78. Do you want her to have Metoprolol 100 mg Extended Release.

## 2021-07-29 NOTE — Care Management Important Message (Signed)
Important Message  Patient Details IM Letter given to the Patient. Name: Amanda Gordon MRN: [id number] Date of Birth: Dec 18, 1940   Medicare Important Message Given:  Yes     Kerin Salen 07/29/2021, 11:29 AM

## 2021-07-30 DIAGNOSIS — C800 Disseminated malignant neoplasm, unspecified: Secondary | ICD-10-CM | POA: Diagnosis not present

## 2021-07-30 DIAGNOSIS — R180 Malignant ascites: Secondary | ICD-10-CM | POA: Diagnosis not present

## 2021-07-30 DIAGNOSIS — E785 Hyperlipidemia, unspecified: Secondary | ICD-10-CM | POA: Diagnosis not present

## 2021-07-30 DIAGNOSIS — N83201 Unspecified ovarian cyst, right side: Secondary | ICD-10-CM | POA: Diagnosis not present

## 2021-07-30 LAB — CBC
HCT: 41.1 % (ref 36.0–46.0)
Hemoglobin: 13.4 g/dL (ref 12.0–15.0)
MCH: 27.2 pg (ref 26.0–34.0)
MCHC: 32.6 g/dL (ref 30.0–36.0)
MCV: 83.4 fL (ref 80.0–100.0)
Platelets: 448 10*3/uL — ABNORMAL HIGH (ref 150–400)
RBC: 4.93 MIL/uL (ref 3.87–5.11)
RDW: 15.9 % — ABNORMAL HIGH (ref 11.5–15.5)
WBC: 10.1 10*3/uL (ref 4.0–10.5)
nRBC: 0 % (ref 0.0–0.2)

## 2021-07-30 LAB — COMPREHENSIVE METABOLIC PANEL
ALT: 8 U/L (ref 0–44)
AST: 18 U/L (ref 15–41)
Albumin: 2.4 g/dL — ABNORMAL LOW (ref 3.5–5.0)
Alkaline Phosphatase: 82 U/L (ref 38–126)
Anion gap: 11 (ref 5–15)
BUN: 23 mg/dL (ref 8–23)
CO2: 27 mmol/L (ref 22–32)
Calcium: 9.2 mg/dL (ref 8.9–10.3)
Chloride: 99 mmol/L (ref 98–111)
Creatinine, Ser: 0.94 mg/dL (ref 0.44–1.00)
GFR, Estimated: >60 mL/min (ref >60)
Glucose, Bld: 116 mg/dL — ABNORMAL HIGH (ref 70–99)
Potassium: 4.2 mmol/L (ref 3.5–5.1)
Sodium: 137 mmol/L (ref 135–145)
Total Bilirubin: 0.4 mg/dL (ref 0.3–1.2)
Total Protein: 5.7 g/dL — ABNORMAL LOW (ref 6.5–8.1)

## 2021-07-30 LAB — GLUCOSE, CAPILLARY
Glucose-Capillary: 118 mg/dL — ABNORMAL HIGH (ref 70–99)
Glucose-Capillary: 135 mg/dL — ABNORMAL HIGH (ref 70–99)
Glucose-Capillary: 167 mg/dL — ABNORMAL HIGH (ref 70–99)
Glucose-Capillary: 139 mg/dL — ABNORMAL HIGH (ref 70–99)

## 2021-07-30 LAB — CYTOLOGY - NON PAP

## 2021-07-30 NOTE — Progress Notes (Signed)
PT Cancellation Note  Patient Details Name: Amanda Gordon MRN: [id number] DOB: 1941-05-23   Cancelled Treatment:    Reason Eval/Treat Not Completed: Other (comment) (MD at bedside). Will check back for PT eval as schedule permits.    Talbot Grumbling PT, DPT 07/30/21, 3:44 PM

## 2021-07-30 NOTE — Progress Notes (Deleted)
Spoke and gave report to RN at [employer].

## 2021-07-30 NOTE — Progress Notes (Signed)
PROGRESS NOTE  Amanda Gordon DOB: [birth year] DOA: 07/24/2021 PCP: System, Provider Not In   LOS: 4 days   Brief Narrative / Interim history: Amanda Gordon is a 80 y.o. female with medical history significant of type 2 diabetes, unspecified anemia, hyperlipidemia, hypertension, osteoarthritis of the left knee, type 2 diabetes mellitus, class I obesity presented to the hospital with abdominal distention weight loss with dry history constipation and episode of hematochezia.  CT scan of the abdomen and pelvis showed ascites with enlarged retroperitoneal lymph nodes so patient was admitted hospital for further evaluation and treatment  Subjective / 24h Interval events: Complains of abdominal discomfort, about to eat breakfast  Assessment & Plan: Principal Problem Abdominal pain nausea with ascites -Had concerns for carcinomatosis and had been seen by oncology.  CEA CA 19-9 and CA125 elevated.  Transvaginal ultrasound showed 4.62 cm right ovarian cyst somewhat complex indeterminate for malignancy.  Cytology pending from paracentesis, I called lab today and is awaiting pathology review.  Could consider repeat paracentesis if worsening distention and pain.  Continue IV Lasix.  Dr Louanne Belton spoke with Dr. Berline Lopes GYN oncology who requested cytology of the fluid which is currently pending.  Medical oncology on board currently. -Continue IV Lasix for potentially 1 more day and probably can transition to oral tomorrow  Active Problems Borderline prolonged QT interval-Continue telemetry.  Continue to replenish electrolytes as necessary.   Hypokalemia-Improved after replacement.  Potassium is normal this morning   Hyponatremia, hypovolemic -Improving.   Mild protein malnutrition -Likely secondary to underlying malignancy.  Oral nutrition.   Hypertension -Continue lisinopril metoprolol   Hyperlipidemia-Not on treatment.   Type 2 diabetes mellitus  -Continue sliding scale insulin,  Accu-Cheks, diabetic diet.  Scheduled Meds:  amLODipine  10 mg Oral Daily   chlorhexidine  15 mL Mouth Rinse BID   feeding supplement  1 Container Oral TID BM   furosemide  40 mg Intravenous BID   insulin aspart  0-5 Units Subcutaneous QHS   insulin aspart  0-9 Units Subcutaneous TID WC   lisinopril  20 mg Oral Daily   mouth rinse  15 mL Mouth Rinse q12n4p   metoprolol succinate  100 mg Oral QHS   multivitamin with minerals  1 tablet Oral Daily   pantoprazole  40 mg Oral Daily   polyethylene glycol  17 g Oral Daily   Continuous Infusions: PRN Meds:.acetaminophen **OR** acetaminophen, bisacodyl, lip balm, ondansetron, prochlorperazine  Diet Orders (From admission, onward)     Start     Ordered   07/27/21 1039  DIET SOFT Room service appropriate? Yes; Fluid consistency: Thin  Diet effective now       Question Answer Comment  Room service appropriate? Yes   Fluid consistency: Thin      07/27/21 1038            DVT prophylaxis: SCDs Start: 07/24/21 1637     Code Status: Full Code  Family Communication: No family at bedside  Status is: Inpatient  Remains inpatient appropriate because:Inpatient level of care appropriate due to severity of illness  Dispo: The patient is from: Home              Anticipated d/c is to: Home              Patient currently is not medically stable to d/c.   Difficult to place patient No  Level of care: Med-Surg  Consultants:  Oncology   Procedures:  none  Microbiology  none  Antimicrobials: none    Objective: Vitals:   07/29/21 1028 07/29/21 1553 07/29/21 2014 07/30/21 0345  BP: 109/80 (!) 89/63 92/69 112/77  Pulse: 84 78 85 81  Resp:   20 20  Temp:  97.8 F (36.6 C) 97.7 F (36.5 C) 98.2 F (36.8 C)  TempSrc:  Oral Oral Oral  SpO2: 93% 94% 93% 95%  Weight:      Height:        Intake/Output Summary (Last 24 hours) at 07/30/2021 1121 Last data filed at 07/29/2021 2000 Gross per 24 hour  Intake 240 ml  Output 1  ml  Net 239 ml   Filed Weights   07/24/21 1527 07/25/21 0011  Weight: 79.2 kg 75.1 kg    Examination:  Constitutional: NAD Eyes: no scleral icterus ENMT: Mucous membranes are moist.  Neck: normal, supple Respiratory: clear to auscultation bilaterally, no wheezing, no crackles. Normal respiratory effort.  Cardiovascular: Regular rate and rhythm, no murmurs / rubs / gallops.  Abdomen: non distended, no tenderness. Bowel sounds positive.  Musculoskeletal: no clubbing / cyanosis.  Skin: no rashes Neurologic: non focal    Data Reviewed: I have independently reviewed following labs and imaging studies  CBC: Recent Labs  Lab 07/23/21 1429 07/24/21 1225 07/25/21 0347 07/26/21 0347 07/27/21 0348 07/28/21 0328 07/29/21 0337 07/30/21 0321  WBC 10.2 10.0   < > 8.7 8.6 9.0 10.4 10.1  NEUTROABS 8.0* 8.2*  --   --   --   --   --   --   HGB 14.4 14.3   < > 13.1 12.9 13.7 13.5 13.4  HCT 45.2 44.6   < > 40.6 39.1 42.9 41.7 41.1  MCV 85.4 85.0   < > 84.6 83.5 85.3 84.6 83.4  PLT 512* 477*   < > 415* 382 410* 441* 448*   < > = values in this interval not displayed.   Basic Metabolic Panel: Recent Labs  Lab 07/24/21 1640 07/25/21 0347 07/26/21 0347 07/27/21 0348 07/28/21 0328 07/29/21 0337 07/30/21 0321  NA  --    < > 133* 139 133* 133* 137  K  --    < > 3.2* 4.8 4.3 4.2 4.2  CL  --    < > 94* 102 96* 96* 99  CO2  --    < > 30 28 28 26 27   GLUCOSE  --    < > 105* 92 128* 137* 116*  BUN  --    < > 14 14 15 20 23   CREATININE  --    < > 0.70 0.62 0.80 0.74 0.94  CALCIUM  --    < > 8.5* 8.8* 8.4* 8.7* 9.2  MG 1.9  --   --   --   --   --   --   PHOS 4.0  --   --   --   --   --   --    < > = values in this interval not displayed.   Liver Function Tests: Recent Labs  Lab 07/26/21 0347 07/27/21 0348 07/28/21 0328 07/29/21 0337 07/30/21 0321  AST 17 13* 15 17 18   ALT 8 8 8 8 8   ALKPHOS 79 80 82 81 82  BILITOT 0.7 0.4 0.7 0.5 0.4  PROT 5.5* 5.2* 5.4* 5.7* 5.7*  ALBUMIN  2.6* 2.4* 2.4* 2.4* 2.4*   Coagulation Profile: No results for input(s): INR, PROTIME in the last 168 hours. HbA1C: No results for input(s): HGBA1C in the last 72  hours. CBG: Recent Labs  Lab 07/29/21 0755 07/29/21 1235 07/29/21 1710 07/29/21 2302 07/30/21 0731  GLUCAP 132* 132* 141* 125* 118*    Recent Results (from the past 240 hour(s))  Surgical pcr screen     Status: None   Collection Time: 07/23/21  2:29 PM   Specimen: Nasal Mucosa; Nasal Swab  Result Value Ref Range Status   MRSA, PCR NEGATIVE NEGATIVE Final   Staphylococcus aureus NEGATIVE NEGATIVE Final    Comment: (NOTE) The Xpert SA Assay (FDA approved for NASAL specimens in patients 13 years of age and older), is one component of a comprehensive surveillance program. It is not intended to diagnose infection nor to guide or monitor treatment. Performed at [facility], [place] [address] [place], [place] [address]   Urine Culture     Status: Abnormal   Collection Time: 07/23/21  2:29 PM   Specimen: Urine, Clean Catch  Result Value Ref Range Status   Specimen Description   Final    URINE, CLEAN CATCH Performed at [facility], [place] [address] [place], [place] [address]    Special Requests   Final    NONE Performed at [facility], [place] [address], [place], [place] [address]    Culture (A)  Final    <10,000 COLONIES/mL INSIGNIFICANT GROWTH Performed at [place] [address], [place], [place] [address]    Report Status 07/24/2021 FINAL  Final  Resp Panel by RT-PCR (Flu A&B, Covid) Nasopharyngeal Swab     Status: None   Collection Time: 07/24/21  4:16 PM   Specimen: Nasopharyngeal Swab; Nasopharyngeal(NP) swabs in vial transport medium  Result Value Ref Range Status   SARS Coronavirus 2 by RT PCR NEGATIVE NEGATIVE Final    Comment: (NOTE) SARS-CoV-2 target nucleic acids are NOT DETECTED.  The SARS-CoV-2 RNA is generally detectable in upper  respiratory specimens during the acute phase of infection. The lowest concentration of SARS-CoV-2 viral copies this assay can detect is 138 copies/mL. A negative result does not preclude SARS-Cov-2 infection and should not be used as the sole basis for treatment or other patient management decisions. A negative result may occur with  improper specimen collection/handling, submission of specimen other than nasopharyngeal swab, presence of viral mutation(s) within the areas targeted by this assay, and inadequate number of viral copies(<138 copies/mL). A negative result must be combined with clinical observations, patient history, and epidemiological information. The expected result is Negative.  Fact Sheet for Patients:  EntrepreneurPulse.com.au  Fact Sheet for Healthcare Providers:  IncredibleEmployment.be  This test is no t yet approved or cleared by the [place] FDA and  has been authorized for detection and/or diagnosis of SARS-CoV-2 by FDA under an Emergency Use Authorization (EUA). This EUA will remain  in effect (meaning this test can be used) for the duration of the COVID-19 declaration under Section 564(b)(1) of the Act, 21 U.S.C.section 360bbb-3(b)(1), unless the authorization is terminated  or revoked sooner.       Influenza A by PCR NEGATIVE NEGATIVE Final   Influenza B by PCR NEGATIVE NEGATIVE Final    Comment: (NOTE) The Xpert Xpress SARS-CoV-2/FLU/RSV plus assay is intended as an aid in the diagnosis of influenza from Nasopharyngeal swab specimens and should not be used as a sole basis for treatment. Nasal washings and aspirates are unacceptable for Xpert Xpress SARS-CoV-2/FLU/RSV testing.  Fact Sheet for Patients: EntrepreneurPulse.com.au  Fact Sheet for Healthcare Providers: IncredibleEmployment.be  This test is not yet  approved or cleared by the [place] and has been  authorized for detection and/or diagnosis of SARS-CoV-2 by FDA under an Emergency Use Authorization (EUA). This EUA will remain in effect (meaning this test can be used) for the duration of the COVID-19 declaration under Section 564(b)(1) of the Act, 21 U.S.C. section 360bbb-3(b)(1), unless the authorization is terminated or revoked.  Performed at [facility], [place] [address], [place],  [address]      Radiology Studies: No results found.  Marzetta Board, MD, PhD [facility]  Between 7 am - 7 pm I am available, please contact me via Amion (for emergencies) or Securechat (non urgent messages)  Between 7 pm - 7 am I am not available, please contact night coverage MD/APP via Amion

## 2021-07-30 NOTE — Consult Note (Signed)
Referring Provider: Dr. Marzetta Board Primary Care Physician:  System, Provider Not In Primary Gastroenterologist:  Dr. Clarene Essex  Reason for Consultation:  Anemia, malignant ascites, nausea, hematochezia  HPI: Amanda Gordon is a 80 y.o. female  with history of gallstones, peptic ulcer disease, reflux, hyperlipidemia, type 2 diabetes Patient known to Dr. Watt Climes last seen 07/17/2021 for nausea and bloating.   Patient with recent 22 pound weight loss. Having abdominal distention, persistent dry heaving, nausea and decreased appetite so she presented to the ER.Marland Kitchen Experiencing constipation and episode of hematochezia several days ago prior to admission.  On admission, her WBC was 10.0, hemoglobin 14.3, platelets 4-77,000.   Sodium was 133, potassium 3.4, BUN 24, creatinine 0.76 CA 19-9 to 2040, Elevated CA125 level of 124  She had a CT of the abdomen/pelvis with contrast which showed moderate ascites, mildly enlarged retroperitoneal mesenteric lymph nodes which may be inflammatory or represent malignancy/metastatic disease, mild irregular densities in the omentum  She underwent ultrasound-guided paracentesis on 07/24/2021 and 5 L of fluid was removed.  FINAL MICROSCOPIC DIAGNOSIS:  - Malignant cells consistent with adenocarcinoma  - CK7 positivity, patchy CK20 positivity, and CDX2 positivity suggest  the possibility of an upper GI primary.  PAX8, WT1, S100, and Melan A  stains are negative.  Patient was using Goody powders as needed for arthritis. No other blood thinner use.  Denies family history of colon or esophageal cancer.  Last colonoscopy and endoscopy 07/13/2013  colonoscopy showed external and internal hemorrhoids, multiple diverticula otherwise normal recall 10 years.  Endoscopy showed small hiatal hernia chronic gastritis biopsy negative for H. pylori or dysplasia. Patient status post cholecystectomy1992  Past Medical History:  Diagnosis Date   Allergy    Anemia     Complication of anesthesia    Hyperlipidemia    Hypertension    PONV (postoperative nausea and vomiting)    Primary localized osteoarthritis of left knee 07/21/2021   Type 2 diabetes mellitus ([place])     Past Surgical History:  Procedure Laterality Date   ABDOMINAL HYSTERECTOMY     APPENDECTOMY     CHOLECYSTECTOMY      Prior to Admission medications   Medication Sig Start Date End Date Taking? Authorizing Provider  acetaminophen (TYLENOL) 500 MG tablet Take 1,000 mg by mouth every 6 (six) hours as needed for mild pain.   Yes [provider]  amLODipine (NORVASC) 10 MG tablet Take 10 mg by mouth in the morning.   Yes [provider]  Aspirin-Acetaminophen (GOODYS BODY PAIN PO) Take 2 packets by mouth daily as needed (pain).   Yes [provider]  atorvastatin (LIPITOR) 40 MG tablet Take 40 mg by mouth every evening.   Yes [provider]  CALCIUM-VITAMIN D PO Take 1 tablet by mouth daily.   Yes [provider]  lisinopril (PRINIVIL,ZESTRIL) 20 MG tablet Take 20 mg by mouth in the morning.   Yes [provider]  metFORMIN (GLUCOPHAGE) 500 MG tablet Take 1,000 mg by mouth in the morning and at bedtime.   Yes [provider]  metoprolol succinate (TOPROL-XL) 100 MG 24 hr tablet Take 100 mg by mouth at bedtime. 04/02/21  Yes [provider]  Multiple Vitamins-Minerals (CENTRUM SILVER PO) Take 1 tablet by mouth daily.   Yes [provider]  omeprazole (PRILOSEC) 20 MG capsule Take 20 mg by mouth daily as needed (indigestion (hiatal hernia flare)).   Yes [provider]    Scheduled Meds:  amLODipine  10 mg Oral Daily   chlorhexidine  15 mL Mouth Rinse BID   feeding supplement  1 Container Oral TID BM   furosemide  40 mg Intravenous BID   insulin aspart  0-5 Units Subcutaneous QHS   insulin aspart  0-9 Units Subcutaneous TID WC   lisinopril  20 mg Oral Daily   mouth rinse  15 mL Mouth Rinse q12n4p    metoprolol succinate  100 mg Oral QHS   multivitamin with minerals  1 tablet Oral Daily   pantoprazole  40 mg Oral Daily   polyethylene glycol  17 g Oral Daily   Continuous Infusions: PRN Meds:.acetaminophen **OR** acetaminophen, bisacodyl, lip balm, ondansetron, prochlorperazine  Allergies as of 07/24/2021 - Review Complete 07/24/2021  Allergen Reaction Noted   Codeine Nausea Only 04/08/2017    Family History  Problem Relation Age of Onset   Heart disease Father    Hypertension Father    Stroke Father    Diabetes Brother    Heart disease Paternal Grandmother    Stroke Paternal Grandfather     Social History   Socioeconomic History   Marital status: Married    Spouse name: Not on file   Number of children: Not on file   Years of education: Not on file   Highest education level: Not on file  Occupational History   Not on file  Tobacco Use   Smoking status: Never   Smokeless tobacco: Never  Vaping Use   Vaping Use: Never used  Substance and Sexual Activity   Alcohol use: No   Drug use: No   Sexual activity: Not on file  Other Topics Concern   Not on file  Social History Narrative   Not on file   Social Determinants of Health   Financial Resource Strain: Not on file  Food Insecurity: Not on file  Transportation Needs: Not on file  Physical Activity: Not on file  Stress: Not on file  Social Connections: Not on file  Intimate Partner Violence: Not on file    Review of Systems:  Review of Systems  Constitutional:  Positive for weight loss.  Gastrointestinal:  Positive for abdominal pain, constipation, nausea and vomiting. Negative for blood in stool, diarrhea and melena.  Musculoskeletal:  Positive for joint pain.  Psychiatric/Behavioral:  Negative for memory loss.     Physical Exam: Vital signs: Vitals:   07/29/21 2014 07/30/21 0345  BP: 92/69 112/77  Pulse: 85 81  Resp: 20 20  Temp: 97.7 F (36.5 C) 98.2 F (36.8 C)  SpO2: 93% 95%   Last BM  Date: 07/29/21 Physical Exam per Dr. Watt Climes  GI:  Lab Results: Recent Labs    07/28/21 0328 07/29/21 0337 07/30/21 0321  WBC 9.0 10.4 10.1  HGB 13.7 13.5 13.4  HCT 42.9 41.7 41.1  PLT 410* 441* 448*   BMET Recent Labs    07/28/21 0328 07/29/21 0337 07/30/21 0321  NA 133* 133* 137  K 4.3 4.2 4.2  CL 96* 96* 99  CO2 28 26 27   GLUCOSE 128* 137* 116*  BUN 15 20 23   CREATININE 0.80 0.74 0.94  CALCIUM 8.4* 8.7* 9.2   LFT Recent Labs    07/30/21 0321  PROT 5.7*  ALBUMIN 2.4*  AST 18  ALT 8  ALKPHOS 82  BILITOT 0.4   PT/INR No results for input(s): LABPROT, INR in the last 72 hours.  Studies/Results: CT CHEST W CONTRAST  Result Date: 07/29/2021 CLINICAL DATA:  Cancer of unknown primary.  Staging CT scan. EXAM: CT CHEST WITH CONTRAST TECHNIQUE: Multidetector CT imaging of the chest was performed during intravenous contrast administration. CONTRAST:  86mL OMNIPAQUE IOHEXOL 350 MG/ML SOLN COMPARISON:  CT abdomen/pelvis 07/24/2021 FINDINGS: Cardiovascular: The heart is normal in size. No pericardial effusion. The aorta is normal in caliber. No dissection. Scattered atherosclerotic calcifications. The branch vessels are patent. Scattered coronary artery calcifications. Mediastinum/Nodes: No mediastinal or hilar mass or lymphadenopathy. Small scattered lymph nodes are noted. The esophagus is unremarkable. Thyroid gland is unremarkable. Lungs/Pleura: Bilateral pleural effusions, moderate on the right and mild on the left. No enhancing pleural nodules are identified. No pulmonary nodules or pulmonary lesions. Upper Abdomen: Upper abdominal ascites again demonstrated. Calcified granulomas are noted in the spleen. Musculoskeletal: No breast masses, supraclavicular or axillary adenopathy. The bony thorax is intact. Benign T8 hemangioma noted. IMPRESSION: 1. Bilateral pleural effusions, moderate on the right and mild on the left. 2. No mediastinal or hilar mass or adenopathy. 3. No  pulmonary nodules or pulmonary lesions. 4. Upper abdominal ascites. Electronically Signed   By: Marijo Sanes M.D.   On: 07/29/2021 12:28    Impression and Plan Malignant ascites with anemia and nausea Pathology showed possible UGI source Diabetes  Plan for EGD tomorrow. I thoroughly discussed the procedure to include nature, alternatives, benefits, and risks including but not limited to bleeding, perforation, infection, anesthesia/cardiac and pulmonary complications. Patient provides understanding and gave verbal consent to proceed.   Clear liquid diet, NPO at midnight.   Continue daily CBC with transfusion as needed to maintain Hgb >7.    If EGD is not conclusive can consider inpatient or outpatient colonoscopy  [facility] will follow.    LOS: 4 days   Vladimir Crofts  PA-C 07/30/2021, 3:26 PM  Contact #  [phone]

## 2021-07-30 NOTE — Progress Notes (Addendum)
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 Additional Immunohistochemistry being processed to determine origin of cell ? Ovarian vs pancreatico-biliary vs GI. Awaiting final cytology results. Gyn Onc Amanda Gordon aware of patient - discussed. If not Amanda Gordon origin will need gi consultation for EGD/COlonoscopy.  Amanda Gordon  .  HEMATOLOGY/ONCOLOGY INPATIENT PROGRESS NOTE  Date of Service: 07/30/2021  Inpatient Attending: .Amanda Griffins, MD   SUBJECTIVE  Patient was seen in oncologic follow-up for her malignant ascites.  Patient notes no acute new symptoms. 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 This was communicated to Amanda. Renne Gordon and GI has been consulted and has scheduled the patient for EGD tomorrow. Patient notes no bowel movement today but had 1 yesterday. Still notes some nausea but is able to keep some of her supplements and food down with antinausea medications.  OBJECTIVE:  NAD  PHYSICAL EXAMINATION: . Vitals:   07/31/21 1106 07/31/21 1110 07/31/21 1111 07/31/21 1120  BP: (!) 83/62 (!) 90/55 (!) 90/55 97/62  Pulse: 65  64 64  Resp: (!) 25  (!) 23 17  Temp: (!) 97.4 F (36.3 C)     TempSrc: Oral     SpO2: 99%  96% 94%  Weight:      Height:       Filed Weights   07/24/21 1527 07/25/21 0011 07/31/21 0922  Weight: 174 lb 8 oz (79.2 kg) 165 lb 9.1 oz (75.1 kg) 165 lb 9.1 oz (75.1 kg)   .Body mass index is 30.28 kg/m.  GENERAL:alert, in no acute distress and comfortable NECK: supple, no JVD, thyroid normal size, non-tender, without nodularity LYMPH:  no palpable lymphadenopathy in the cervical, axillary or inguinal LUNGS: clear to auscultation  with normal respiratory effort HEART: regular rate & rhythm,  no murmurs and no lower extremity edema ABDOMEN: abdomen distended, tympanic, upper to mid abdominal discomfort on palpation . PSYCH: alert & oriented x 3 with fluent speech NEURO: no focal motor/sensory deficits  MEDICAL HISTORY:  Past Medical History:  Diagnosis Date   Allergy    Anemia    Complication of anesthesia    Hyperlipidemia    Hypertension    PONV (postoperative nausea and vomiting)    Primary localized osteoarthritis of left knee 07/21/2021   Type 2 diabetes mellitus ([place])     SURGICAL HISTORY: Past Surgical History:  Procedure Laterality Date   ABDOMINAL HYSTERECTOMY     APPENDECTOMY     CHOLECYSTECTOMY      SOCIAL HISTORY: Social History   Socioeconomic History   Marital status: Married    Spouse name: Not on file   Number of children: Not on file   Years of education: Not on file   Highest education level: Not on file  Occupational History   Not on file  Tobacco Use   Smoking status: Never   Smokeless tobacco: Never  Vaping Use   Vaping Use: Never used  Substance and Sexual Activity   Alcohol use: No   Drug use: No   Sexual activity: Not on file  Other Topics Concern   Not on file  Social History Narrative   Not on file   Social Determinants of Health   Financial Resource Strain: Not on file  Food Insecurity: Not on file  Transportation Needs: Not on file  Physical Activity: Not on file  Stress: Not on file  Social Connections: Not on file  Intimate Partner Violence: Not on file    FAMILY HISTORY: Family History  Problem Relation Age of Onset   Heart disease Father    Hypertension Father    Stroke Father    Diabetes Brother    Heart disease Paternal Grandmother    Stroke Paternal Grandfather     ALLERGIES:  is allergic to codeine.  MEDICATIONS:  Scheduled Meds:  amLODipine  10 mg Oral Daily   chlorhexidine  15 mL Mouth Rinse BID   feeding supplement  1  Container Oral TID BM   insulin aspart  0-5 Units Subcutaneous QHS   insulin aspart  0-9 Units Subcutaneous TID WC   mouth rinse  15 mL Mouth Rinse q12n4p   metoprolol succinate  100 mg Oral QHS   multivitamin with minerals  1 tablet Oral Daily   pantoprazole  40 mg Oral Daily   polyethylene glycol  17 g Oral Daily   Continuous Infusions: PRN Meds:.acetaminophen **OR** acetaminophen, bisacodyl, lip balm, ondansetron, prochlorperazine  REVIEW OF SYSTEMS:    10 Point review of Systems was done is negative except as noted above.   LABORATORY DATA:  I have reviewed the data as listed  . CBC Latest Ref Rng & Units 07/30/2021 07/29/2021  WBC 4.0 - 10.5 K/uL 10.1 10.4  Hemoglobin 12.0 - 15.0 g/dL 13.4 13.5  Hematocrit 36.0 - 46.0 % 41.1 41.7  Platelets 150 - 400 K/uL 448(H) 441(H)    . CMP Latest Ref Rng & Units 07/30/2021 07/29/2021  Glucose 70 - 99 mg/dL 116(H) 137(H)  BUN 8 - 23 mg/dL 23 20  Creatinine 0.44 - 1.00 mg/dL 0.94 0.74  Sodium 135 - 145 mmol/L 137 133(L)  Potassium 3.5 - 5.1 mmol/L 4.2 4.2  Chloride 98 - 111 mmol/L 99 96(L)  CO2 22 - 32 mmol/L 27 26  Calcium 8.9 - 10.3 mg/dL 9.2 8.7(L)  Total Protein 6.5 - 8.1 g/dL 5.7(L) 5.7(L)  Total Bilirubin 0.3 - 1.2 mg/dL 0.4 0.5  Alkaline Phos 38 - 126 U/L 82 81  AST 15 - 41 U/L 18 17  ALT 0 - 44 U/L 8 8   Component     Latest Ref Rng & Units 07/26/2021  CEA     0.0 - 4.7 ng/mL 32.7 (H)  CA 19-9     0 - 35 U/mL 2,040 (H)  Cancer Antigen (CA) 125     0.0 - 38.1 U/mL 124.0 (H)    CYTOLOGY - NON PAP  CASE: [id number]  PATIENT: Amanda Gordon  Non-Gynecological Cytology Report      Clinical History: Rt ovarian cyst  Specimen Submitted:  A. ASCITES, PARACENTESIS:    FINAL MICROSCOPIC DIAGNOSIS:  - Malignant cells consistent with adenocarcinoma  - CK7 positivity, patchy CK20 positivity, and CDX2 positivity suggest  the possibility of an upper GI primary.  PAX8, WT1, S100, and Melan A  stains are negative.     RADIOGRAPHIC STUDIES: I have personally reviewed the radiological images as listed and agreed with the findings in the report.  CT CHEST W CONTRAST  Result Date: 07/29/2021 CLINICAL DATA:  Cancer of unknown primary. Staging CT scan. EXAM: CT CHEST WITH CONTRAST TECHNIQUE: Multidetector CT imaging of the chest was performed during intravenous contrast administration. CONTRAST:  85mL OMNIPAQUE IOHEXOL 350 MG/ML SOLN COMPARISON:  CT abdomen/pelvis 07/24/2021 FINDINGS: Cardiovascular:  The heart is normal in size. No pericardial effusion. The aorta is normal in caliber. No dissection. Scattered atherosclerotic calcifications. The branch vessels are patent. Scattered coronary artery calcifications. Mediastinum/Nodes: No mediastinal or hilar mass or lymphadenopathy. Small scattered lymph nodes are noted. The esophagus is unremarkable. Thyroid gland is unremarkable. Lungs/Pleura: Bilateral pleural effusions, moderate on the right and mild on the left. No enhancing pleural nodules are identified. No pulmonary nodules or pulmonary lesions. Upper Abdomen: Upper abdominal ascites again demonstrated. Calcified granulomas are noted in the spleen. Musculoskeletal: No breast masses, supraclavicular or axillary adenopathy. The bony thorax is intact. Benign T8 hemangioma noted. IMPRESSION: 1. Bilateral pleural effusions, moderate on the right and mild on the left. 2. No mediastinal or hilar mass or adenopathy. 3. No pulmonary nodules or pulmonary lesions. 4. Upper abdominal ascites. Electronically Signed   By: Marijo Sanes M.D.   On: 07/29/2021 12:28   US PELVIS TRANSVAGINAL NON-OB (TV ONLY)  Result Date: 07/26/2021 CLINICAL DATA:  80 year old female inpatient with a history of hysterectomy and ascites and right adnexal cystic lesion on recent CT. EXAM: ULTRASOUND PELVIS TRANSVAGINAL TECHNIQUE: Transvaginal ultrasound examination of the pelvis was performed including evaluation of the uterus, ovaries, adnexal  regions, and pelvic cul-de-sac. COMPARISON:  07/24/2021 CT abdomen/pelvis. FINDINGS: Uterus Surgically absent. Right ovary Measurements: 4.6 x 4.4 x 3.5 cm = volume: 37 mL. There is limited visualization of a 4.2 x 3.9 x 2.8 cm right ovarian cyst with questionable mildly thickened eccentric septation, and no internal vascularity on color Doppler or discrete mural nodularity. Left ovary Nonvisualization of the left ovary.  No left adnexal masses. Other findings:  No abnormal free fluid IMPRESSION: 1. Limited visualization of a 4.2 cm right ovarian cyst, which appears mildly complex with mildly thickened eccentric septation. This cystic lesion is indeterminate for malignancy particularly given the other worrisome findings of large volume ascites and possible omental caking on the recent CT. 2. No abnormal findings at the hysterectomy margin. 3. Nonvisualization of the left ovary.  No left adnexal masses. Electronically Signed   By: Ilona Sorrel M.D.   On: 07/26/2021 12:59   CT Abdomen Pelvis W Contrast  Result Date: 07/24/2021 CLINICAL DATA:  Abdominal distension, nausea. EXAM: CT ABDOMEN AND PELVIS WITH CONTRAST TECHNIQUE: Multidetector CT imaging of the abdomen and pelvis was performed using the standard protocol following bolus administration of intravenous contrast. CONTRAST:  14mL OMNIPAQUE IOHEXOL 350 MG/ML SOLN COMPARISON:  None. FINDINGS: Lower chest: Small bilateral pleural effusions are noted with adjacent subsegmental atelectasis. Hepatobiliary: No focal liver abnormality is seen. Status post cholecystectomy. No biliary dilatation. Pancreas: Unremarkable. No pancreatic ductal dilatation or surrounding inflammatory changes. Spleen: Calcified splenic granulomata are noted. Adrenals/Urinary Tract: Adrenal glands are unremarkable. Kidneys are normal, without renal calculi, focal lesion, or hydronephrosis. Bladder is unremarkable. Stomach/Bowel: Stomach appears normal. There is no evidence of bowel  obstruction or inflammation. Status post appendectomy. Sigmoid diverticulosis is noted without inflammation. Vascular/Lymphatic: Aortic atherosclerosis. Mildly enlarged retroperitoneal and mesenteric lymph nodes are noted which may be inflammatory in etiology, but malignancy or metastatic disease cannot be excluded. Largest lymph node measures 13 mm in right periaortic region. Reproductive: Status post hysterectomy. 3.8 cm right ovarian cystic abnormality is noted. No definite left adnexal abnormality is noted. Other: Moderate ascites is noted. Mild irregular densities are seen involving the omentum concerning for possible peritoneal carcinomatosis. Musculoskeletal: No acute or significant osseous findings. IMPRESSION: Moderate ascites is noted. Mildly enlarged retroperitoneal mesenteric lymph nodes are noted, the largest measuring 13  mm in the right periaortic region. While these may be inflammatory in etiology, malignancy or metastatic disease cannot be excluded. Mild irregular densities are seen involving the omentum which may represent inflammation, but peritoneal carcinomatosis cannot be excluded. Status post hysterectomy. 3.8 cm right ovarian cyst is noted. Recommend follow-up US in 6-12 months. Note: This recommendation does not apply to premenarchal patients and to those with increased risk (genetic, family history, elevated tumor markers or other high-risk factors) of ovarian cancer. Reference: JACR 2020 Feb; 17(2):248-254. Sigmoid diverticulosis without inflammation. Small bilateral pleural effusions with minimal adjacent subsegmental atelectasis. Aortic Atherosclerosis (ICD10-I70.0). Electronically Signed   By: Marijo Conception M.D.   On: 07/24/2021 15:43   US Paracentesis  Result Date: 07/25/2021 INDICATION: Concern for intra-abdominal malignancy, now with symptomatic ascites. Please perform ultrasound-guided paracentesis for diagnostic and therapeutic purposes. Max paracentesis volume of 5 L  requested. EXAM: ULTRASOUND GUIDED DIAGNOSTIC AND THERAPEUTIC PARACENTESIS COMPARISON:  CT abdomen and pelvis-earlier same day MEDICATIONS: None. COMPLICATIONS: None immediate. TECHNIQUE: Informed written consent was obtained from the patient after a discussion of the risks, benefits and alternatives to treatment. A timeout was performed prior to the initiation of the procedure. Initial ultrasound scanning demonstrates a large amount of ascites within the left lower abdomen which was subsequently prepped and draped in the usual sterile fashion. 1% lidocaine with epinephrine was used for local anesthesia. An ultrasound image was saved for documentation purposed. An 8 Fr Safe-T-Centesis catheter was introduced. The paracentesis was performed. The catheter was removed and a dressing was applied. The patient tolerated the procedure well without immediate post procedural complication. FINDINGS: A total of approximately 5 liters of serous fluid was removed. Samples were sent to the laboratory as requested by the clinical team. IMPRESSION: Successful ultrasound-guided paracentesis yielding 5 liters of peritoneal fluid. Electronically Signed   By: Sandi Mariscal M.D.   On: 07/25/2021 08:18   [place] ASCITES (ABDOMEN LIMITED)  Result Date: 07/27/2021 CLINICAL DATA:  Patient is complaining of leaking at previous paracentesis puncture site. EXAM: LIMITED ABDOMEN ULTRASOUND FOR ASCITES TECHNIQUE: Limited ultrasound survey for ascites was performed in all four abdominal quadrants. COMPARISON:  07/24/2021 FINDINGS: Small to moderate amount of ascites in the right upper quadrant and right lower quadrant. Minimal ascites in left upper quadrant. No significant ascites in left lower quadrant. IMPRESSION: Small to moderate amount of ascites in the right abdomen. Paracentesis not performed. Electronically Signed   By: Markus Daft M.D.   On: 07/27/2021 13:32    ASSESSMENT & PLAN:    80 year old female with  1.  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  2.retroperitoneal lymphadenopathy and possible peritoneal carcinomatosis -07/24/2021 CT of the abdomen/pelvis with contrast- "Moderate ascites is noted. Mildly enlarged retroperitoneal mesenteric lymph nodes are noted, the largest measuring 13 mm in the right periaortic region. While these may be inflammatory in etiology, malignancy or metastatic disease cannot be excluded. Mild irregular densities are seen involving the omentum which may represent inflammation, but peritoneal carcinomatosis cannot be excluded. Status post hysterectomy. 3.8 cm right ovarian cyst is noted. Recommend an EGD tomorrow [place] in 6-12 months. Note: This recommendation does not apply to premenarchal patients and to those with increased risk (genetic, family history, elevated tumor markers or other high-risk factors) of ovarian cancer. Reference: JACR 2020 Feb; 17(2):248-254. Sigmoid diverticulosis without inflammation. Small bilateral pleural effusions with minimal adjacent subsegmental atelectasis. Aortic Atherosclerosis (ICD10-I70.0)." -07/26/2021 transvaginal ultrasound- "1. Limited  visualization of a 4.2 cm right ovarian cyst, which appears mildly complex with mildly thickened eccentric septation. This cystic lesion is indeterminate for malignancy particularly given the other worrisome findings of large volume ascites and possible omental caking on the recent CT. 2. No abnormal findings at the hysterectomy margin.3. Nonvisualization of the left ovary.  No left adnexal masses."   3.  Protein calorie malnutrition   4.  Hypertension   5.  Hyperlipidemia   6.  Type II diabetes mellitus   PLAN  I discussed the new cytology results with the patient at bedside. -Patient's daughter was updated by hospital medicine and GI teams regarding the finding of malignant adenocarcinoma involving the ascitic fluid -GI has  been consulted and is scheduled to perform EGD tomorrow -EGD negative will defer to GI regarding need for capsule endoscopy/colonoscopy/additional MRI/MRCP of the hepatobiliary system for further evaluation.  I spent 25 minutes counseling the patient face to face. The total time spent in the appointment was 74minutes and more than 50% was on counseling and direct patient cares, coordination of care with hospital medicine and pathology    Amanda Lone MD [facility] Hematology/Oncology Physician [facility]  (Office):       [phone] (Work cell):  [phone] (Fax):           [phone]

## 2021-07-30 NOTE — H&P (View-Only) (Signed)
Referring Provider: Dr. Marzetta Board Primary Care Physician:  System, Provider Not In Primary Gastroenterologist:  Dr. Clarene Essex  Reason for Consultation:  Anemia, malignant ascites, nausea, hematochezia  HPI: Alfredo Collymore Hodgkin is a 80 y.o. female  with history of gallstones, peptic ulcer disease, reflux, hyperlipidemia, type 2 diabetes Patient known to Dr. Watt Climes last seen 07/17/2021 for nausea and bloating.   Patient with recent 22 pound weight loss. Having abdominal distention, persistent dry heaving, nausea and decreased appetite so she presented to the ER.Marland Kitchen Experiencing constipation and episode of hematochezia several days ago prior to admission.  On admission, her WBC was 10.0, hemoglobin 14.3, platelets 4-77,000.   Sodium was 133, potassium 3.4, BUN 24, creatinine 0.76 CA 19-9 to 2040, Elevated CA125 level of 124  She had a CT of the abdomen/pelvis with contrast which showed moderate ascites, mildly enlarged retroperitoneal mesenteric lymph nodes which may be inflammatory or represent malignancy/metastatic disease, mild irregular densities in the omentum  She underwent ultrasound-guided paracentesis on 07/24/2021 and 5 L of fluid was removed.  FINAL MICROSCOPIC DIAGNOSIS:  - Malignant cells consistent with adenocarcinoma  - CK7 positivity, patchy CK20 positivity, and CDX2 positivity suggest  the possibility of an upper GI primary.  PAX8, WT1, S100, and Melan A  stains are negative.  Patient was using Goody powders as needed for arthritis. No other blood thinner use.  Denies family history of colon or esophageal cancer.  Last colonoscopy and endoscopy 07/13/2013  colonoscopy showed external and internal hemorrhoids, multiple diverticula otherwise normal recall 10 years.  Endoscopy showed small hiatal hernia chronic gastritis biopsy negative for H. pylori or dysplasia. Patient status post cholecystectomy1992  Past Medical History:  Diagnosis Date   Allergy    Anemia     Complication of anesthesia    Hyperlipidemia    Hypertension    PONV (postoperative nausea and vomiting)    Primary localized osteoarthritis of left knee 07/21/2021   Type 2 diabetes mellitus ([place])     Past Surgical History:  Procedure Laterality Date   ABDOMINAL HYSTERECTOMY     APPENDECTOMY     CHOLECYSTECTOMY      Prior to Admission medications   Medication Sig Start Date End Date Taking? Authorizing Provider  acetaminophen (TYLENOL) 500 MG tablet Take 1,000 mg by mouth every 6 (six) hours as needed for mild pain.   Yes [provider]  amLODipine (NORVASC) 10 MG tablet Take 10 mg by mouth in the morning.   Yes [provider]  Aspirin-Acetaminophen (GOODYS BODY PAIN PO) Take 2 packets by mouth daily as needed (pain).   Yes [provider]  atorvastatin (LIPITOR) 40 MG tablet Take 40 mg by mouth every evening.   Yes [provider]  CALCIUM-VITAMIN D PO Take 1 tablet by mouth daily.   Yes [provider]  lisinopril (PRINIVIL,ZESTRIL) 20 MG tablet Take 20 mg by mouth in the morning.   Yes [provider]  metFORMIN (GLUCOPHAGE) 500 MG tablet Take 1,000 mg by mouth in the morning and at bedtime.   Yes [provider]  metoprolol succinate (TOPROL-XL) 100 MG 24 hr tablet Take 100 mg by mouth at bedtime. 04/02/21  Yes [provider]  Multiple Vitamins-Minerals (CENTRUM SILVER PO) Take 1 tablet by mouth daily.   Yes [provider]  omeprazole (PRILOSEC) 20 MG capsule Take 20 mg by mouth daily as needed (indigestion (hiatal hernia flare)).   Yes [provider]    Scheduled Meds:  amLODipine  10 mg Oral Daily   chlorhexidine  15 mL Mouth Rinse BID   feeding supplement  1 Container Oral TID BM   furosemide  40 mg Intravenous BID   insulin aspart  0-5 Units Subcutaneous QHS   insulin aspart  0-9 Units Subcutaneous TID WC   lisinopril  20 mg Oral Daily   mouth rinse  15 mL Mouth Rinse q12n4p    metoprolol succinate  100 mg Oral QHS   multivitamin with minerals  1 tablet Oral Daily   pantoprazole  40 mg Oral Daily   polyethylene glycol  17 g Oral Daily   Continuous Infusions: PRN Meds:.acetaminophen **OR** acetaminophen, bisacodyl, lip balm, ondansetron, prochlorperazine  Allergies as of 07/24/2021 - Review Complete 07/24/2021  Allergen Reaction Noted   Codeine Nausea Only 04/08/2017    Family History  Problem Relation Age of Onset   Heart disease Father    Hypertension Father    Stroke Father    Diabetes Brother    Heart disease Paternal Grandmother    Stroke Paternal Grandfather     Social History   Socioeconomic History   Marital status: Married    Spouse name: Not on file   Number of children: Not on file   Years of education: Not on file   Highest education level: Not on file  Occupational History   Not on file  Tobacco Use   Smoking status: Never   Smokeless tobacco: Never  Vaping Use   Vaping Use: Never used  Substance and Sexual Activity   Alcohol use: No   Drug use: No   Sexual activity: Not on file  Other Topics Concern   Not on file  Social History Narrative   Not on file   Social Determinants of Health   Financial Resource Strain: Not on file  Food Insecurity: Not on file  Transportation Needs: Not on file  Physical Activity: Not on file  Stress: Not on file  Social Connections: Not on file  Intimate Partner Violence: Not on file    Review of Systems:  Review of Systems  Constitutional:  Positive for weight loss.  Gastrointestinal:  Positive for abdominal pain, constipation, nausea and vomiting. Negative for blood in stool, diarrhea and melena.  Musculoskeletal:  Positive for joint pain.  Psychiatric/Behavioral:  Negative for memory loss.     Physical Exam: Vital signs: Vitals:   07/29/21 2014 07/30/21 0345  BP: 92/69 112/77  Pulse: 85 81  Resp: 20 20  Temp: 97.7 F (36.5 C) 98.2 F (36.8 C)  SpO2: 93% 95%   Last BM  Date: 07/29/21 Physical Exam per Dr. Watt Climes  GI:  Lab Results: Recent Labs    07/28/21 0328 07/29/21 0337 07/30/21 0321  WBC 9.0 10.4 10.1  HGB 13.7 13.5 13.4  HCT 42.9 41.7 41.1  PLT 410* 441* 448*   BMET Recent Labs    07/28/21 0328 07/29/21 0337 07/30/21 0321  NA 133* 133* 137  K 4.3 4.2 4.2  CL 96* 96* 99  CO2 28 26 27   GLUCOSE 128* 137* 116*  BUN 15 20 23   CREATININE 0.80 0.74 0.94  CALCIUM 8.4* 8.7* 9.2   LFT Recent Labs    07/30/21 0321  PROT 5.7*  ALBUMIN 2.4*  AST 18  ALT 8  ALKPHOS 82  BILITOT 0.4   PT/INR No results for input(s): LABPROT, INR in the last 72 hours.  Studies/Results: CT CHEST W CONTRAST  Result Date: 07/29/2021 CLINICAL DATA:  Cancer of unknown primary.  Staging CT scan. EXAM: CT CHEST WITH CONTRAST TECHNIQUE: Multidetector CT imaging of the chest was performed during intravenous contrast administration. CONTRAST:  75mL OMNIPAQUE IOHEXOL 350 MG/ML SOLN COMPARISON:  CT abdomen/pelvis 07/24/2021 FINDINGS: Cardiovascular: The heart is normal in size. No pericardial effusion. The aorta is normal in caliber. No dissection. Scattered atherosclerotic calcifications. The branch vessels are patent. Scattered coronary artery calcifications. Mediastinum/Nodes: No mediastinal or hilar mass or lymphadenopathy. Small scattered lymph nodes are noted. The esophagus is unremarkable. Thyroid gland is unremarkable. Lungs/Pleura: Bilateral pleural effusions, moderate on the right and mild on the left. No enhancing pleural nodules are identified. No pulmonary nodules or pulmonary lesions. Upper Abdomen: Upper abdominal ascites again demonstrated. Calcified granulomas are noted in the spleen. Musculoskeletal: No breast masses, supraclavicular or axillary adenopathy. The bony thorax is intact. Benign T8 hemangioma noted. IMPRESSION: 1. Bilateral pleural effusions, moderate on the right and mild on the left. 2. No mediastinal or hilar mass or adenopathy. 3. No  pulmonary nodules or pulmonary lesions. 4. Upper abdominal ascites. Electronically Signed   By: Marijo Sanes M.D.   On: 07/29/2021 12:28    Impression and Plan Malignant ascites with anemia and nausea Pathology showed possible UGI source Diabetes  Plan for EGD tomorrow. I thoroughly discussed the procedure to include nature, alternatives, benefits, and risks including but not limited to bleeding, perforation, infection, anesthesia/cardiac and pulmonary complications. Patient provides understanding and gave verbal consent to proceed.   Clear liquid diet, NPO at midnight.   Continue daily CBC with transfusion as needed to maintain Hgb >7.    If EGD is not conclusive can consider inpatient or outpatient colonoscopy  [facility] will follow.    LOS: 4 days   Vladimir Crofts  PA-C 07/30/2021, 3:26 PM  Contact #  [phone]

## 2021-07-31 ENCOUNTER — Inpatient Hospital Stay (HOSPITAL_COMMUNITY): Payer: Medicare Other | Admitting: Anesthesiology

## 2021-07-31 ENCOUNTER — Encounter (HOSPITAL_COMMUNITY): Payer: Self-pay | Admitting: Internal Medicine

## 2021-07-31 ENCOUNTER — Inpatient Hospital Stay (HOSPITAL_COMMUNITY): Payer: Medicare Other

## 2021-07-31 ENCOUNTER — Encounter (HOSPITAL_COMMUNITY)
Admission: EM | Disposition: A | Discharge status: Home / Self Care | Payer: Self-pay | Source: Home / Self Care | Attending: Internal Medicine

## 2021-07-31 DIAGNOSIS — E785 Hyperlipidemia, unspecified: Secondary | ICD-10-CM | POA: Diagnosis not present

## 2021-07-31 DIAGNOSIS — R180 Malignant ascites: Secondary | ICD-10-CM | POA: Diagnosis not present

## 2021-07-31 DIAGNOSIS — C800 Disseminated malignant neoplasm, unspecified: Secondary | ICD-10-CM | POA: Diagnosis not present

## 2021-07-31 DIAGNOSIS — N83201 Unspecified ovarian cyst, right side: Secondary | ICD-10-CM | POA: Diagnosis not present

## 2021-07-31 HISTORY — PX: BIOPSY: SHX5522

## 2021-07-31 HISTORY — PX: ESOPHAGOGASTRODUODENOSCOPY (EGD) WITH PROPOFOL: SHX5813

## 2021-07-31 LAB — CBC
HCT: 39.2 % (ref 36.0–46.0)
Hemoglobin: 12.8 g/dL (ref 12.0–15.0)
MCH: 27.4 pg (ref 26.0–34.0)
MCHC: 32.7 g/dL (ref 30.0–36.0)
MCV: 83.9 fL (ref 80.0–100.0)
Platelets: 397 10*3/uL (ref 150–400)
RBC: 4.67 MIL/uL (ref 3.87–5.11)
RDW: 15.9 % — ABNORMAL HIGH (ref 11.5–15.5)
WBC: 9.2 10*3/uL (ref 4.0–10.5)
nRBC: 0 % (ref 0.0–0.2)

## 2021-07-31 LAB — BASIC METABOLIC PANEL
Anion gap: 9 (ref 5–15)
BUN: 29 mg/dL — ABNORMAL HIGH (ref 8–23)
CO2: 27 mmol/L (ref 22–32)
Calcium: 8.4 mg/dL — ABNORMAL LOW (ref 8.9–10.3)
Chloride: 93 mmol/L — ABNORMAL LOW (ref 98–111)
Creatinine, Ser: 1.27 mg/dL — ABNORMAL HIGH (ref 0.44–1.00)
GFR, Estimated: 43 mL/min — ABNORMAL LOW (ref >60)
Glucose, Bld: 115 mg/dL — ABNORMAL HIGH (ref 70–99)
Potassium: 4.2 mmol/L (ref 3.5–5.1)
Sodium: 129 mmol/L — ABNORMAL LOW (ref 135–145)

## 2021-07-31 LAB — GLUCOSE, CAPILLARY
Glucose-Capillary: 131 mg/dL — ABNORMAL HIGH (ref 70–99)
Glucose-Capillary: 131 mg/dL — ABNORMAL HIGH (ref 70–99)
Glucose-Capillary: 116 mg/dL — ABNORMAL HIGH (ref 70–99)
Glucose-Capillary: 125 mg/dL — ABNORMAL HIGH (ref 70–99)

## 2021-07-31 SURGERY — ESOPHAGOGASTRODUODENOSCOPY (EGD) WITH PROPOFOL
Anesthesia: Monitor Anesthesia Care

## 2021-07-31 MED ORDER — LACTATED RINGERS IV SOLN: INTRAVENOUS | Status: DC | PRN

## 2021-07-31 MED ORDER — GADOBUTROL 1 MMOL/ML IV SOLN
8.0000 mL | Freq: Once | INTRAVENOUS | Status: AC | PRN
  Administered 2021-07-31: 8 mL via INTRAVENOUS

## 2021-07-31 MED ORDER — PROPOFOL 500 MG/50ML IV EMUL
INTRAVENOUS | Status: DC | PRN
  Administered 2021-07-31: 125 ug/kg/min via INTRAVENOUS

## 2021-07-31 MED ORDER — STERILE WATER FOR IRRIGATION IR SOLN
Status: DC | PRN
  Administered 2021-07-31: 60 mL

## 2021-07-31 MED ORDER — PROPOFOL 10 MG/ML IV BOLUS
INTRAVENOUS | Status: DC | PRN
  Administered 2021-07-31 (×2): 20 mg via INTRAVENOUS

## 2021-07-31 MED ORDER — ACETAMINOPHEN 650 MG RE SUPP: 650.0000 mg | Freq: Four times a day (QID) | RECTAL | Status: DC | PRN

## 2021-07-31 MED ORDER — PROPOFOL 500 MG/50ML IV EMUL
INTRAVENOUS | Status: AC
  Filled 2021-07-31: qty 50

## 2021-07-31 MED ORDER — ACETAMINOPHEN 325 MG PO TABS
650.0000 mg | ORAL_TABLET | ORAL | Status: DC | PRN
  Administered 2021-07-31 – 2021-08-04 (×10): 650 mg via ORAL
  Filled 2021-07-31 (×11): qty 2

## 2021-07-31 SURGICAL SUPPLY — 15 items

## 2021-07-31 NOTE — Progress Notes (Signed)
Patient s/p GI procedure Tolerated well  See epic for vital signs and full report Report called to Unit RN Patient aware of plan

## 2021-07-31 NOTE — Op Note (Signed)
[facility] Patient Name: Amanda Gordon Procedure Date: 07/31/2021 MRN: [id number] Attending MD: Otis Brace , MD Date of Birth: 1940/11/29 CSN: [id number] Age: 80 Admit Type: Inpatient Procedure:                Upper GI endoscopy Indications:              Personal history of malignant neoplasm, ? Upper GI                            source Providers:                Otis Brace, MD, Dulcy Fanny, Frazier Richards, Technician, Cletis Athens, Technician Referring MD:              Medicines:                Sedation Administered by an Anesthesia Professional Complications:            No immediate complications. Estimated Blood Loss:     Estimated blood loss was minimal. Procedure:                Pre-Anesthesia Assessment:                           - Prior to the procedure, a History and Physical                            was performed, and patient medications and                            allergies were reviewed. The patient's tolerance of                            previous anesthesia was also reviewed. The risks                            and benefits of the procedure and the sedation                            options and risks were discussed with the patient.                            All questions were answered, and informed consent                            was obtained. Prior Anticoagulants: The patient has                            taken no previous anticoagulant or antiplatelet                            agents. ASA Grade Assessment: III - A patient with  severe systemic disease. After reviewing the risks                            and benefits, the patient was deemed in                            satisfactory condition to undergo the procedure.                           After obtaining informed consent, the endoscope was                            passed under direct vision. Throughout the                             procedure, the patient's blood pressure, pulse, and                            oxygen saturations were monitored continuously. The                            [id number]-H190 ([id number]) Olympus endoscope was introduced                            through the mouth, and advanced to the second part                            of duodenum. The upper GI endoscopy was                            accomplished without difficulty. The patient                            tolerated the procedure well. Scope In: Scope Out: Findings:      The Z-line was variable and was found 39 cm from the incisors.      Scattered mild inflammation characterized by erythema was found in the       entire examined stomach. Biopsies were taken with a cold forceps for       histology.      The cardia and gastric fundus were normal on retroflexion.      Localized moderate mucosal changes characterized by altered texture and       white specks were found in the duodenal bulb. Biopsies were taken with a       cold forceps for histology.      The exam of the duodenum was otherwise normal. Impression:               - Z-line variable, 39 cm from the incisors.                           - Gastritis. Biopsied.                           - Mucosal changes in the duodenum. Biopsied. Moderate Sedation:      Moderate (conscious) sedation  was personally administered by an       anesthesia professional. The following parameters were monitored: oxygen       saturation, heart rate, blood pressure, and response to care. Recommendation:           - Return patient to hospital [facility] for ongoing care.                           - Resume previous diet.                           - Continue present medications.                           - Await pathology results.                           - Perform MRI/MRCP today. Procedure Code(s):        --- Professional ---                           [phone], Esophagogastroduodenoscopy, flexible,                             transoral; with biopsy, single or multiple Diagnosis Code(s):        --- Professional ---                           K22.8, Other specified diseases of esophagus                           K29.70, Gastritis, unspecified, without bleeding                           K31.89, Other diseases of stomach and duodenum                           Z85.9, Personal history of malignant neoplasm,                            unspecified                           R93.3, Abnormal findings on diagnostic imaging of                            other parts of digestive tract CPT copyright 2019 [employer]. All rights reserved. The codes documented in this report are preliminary and upon coder review may  be revised to meet current compliance requirements. Otis Brace, MD Otis Brace, MD 07/31/2021 11:01:15 AM Number of Addenda: 0

## 2021-07-31 NOTE — Transfer of Care (Signed)
Immediate Anesthesia Transfer of Care Note  Patient: Amanda Gordon  Procedure(s) Performed: ESOPHAGOGASTRODUODENOSCOPY (EGD) WITH PROPOFOL BIOPSY  Patient Location: PACU and Endoscopy Unit  Anesthesia Type:MAC  Level of Consciousness: awake, sedated and responds to stimulation  Airway & Oxygen Therapy: Patient Spontanous Breathing and Patient connected to face mask oxygen  Post-op Assessment: Report given to RN and Post -op Vital signs reviewed and stable  Post vital signs: Reviewed and stable  Last Vitals:  Vitals Value Taken Time  BP 81/57 07/31/21 1100  Temp    Pulse 62 07/31/21 1102  Resp 22 07/31/21 1102  SpO2 100 % 07/31/21 1102  Vitals shown include unvalidated device data.  Last Pain:  Vitals:   07/31/21 0922  TempSrc: Oral  PainSc: 0-No pain      Patients Stated Pain Goal: 0 (35/00/93 8182)  Complications: No notable events documented.

## 2021-07-31 NOTE — Brief Op Note (Signed)
07/24/2021 - 07/31/2021  11:07 AM  PATIENT:  Amanda Gordon  80 y.o. female  PRE-OPERATIVE DIAGNOSIS:  malignant ascites  POST-OPERATIVE DIAGNOSIS:  Abnormal duodenal mucosa  PROCEDURE:  Procedure(s): ESOPHAGOGASTRODUODENOSCOPY (EGD) WITH PROPOFOL (N/A) BIOPSY  SURGEON:  Surgeon(s) and Role:    * Zayvier Caravello, MD - Primary   Findings ------------ -EGD showed mild gastritis and abnormal mucosa in the duodenal bulb.  Biopsies taken.  Recommendations ------------------------- -Get MRI MRCP for further evaluation -clear liquid diet after MRI.  Okay to have liquid diet today if MRI is not going to be done today. -Follow biopsy -GI will follow  Otis Brace MD, FACP 07/31/2021, 11:08 AM  Contact #  [phone]

## 2021-07-31 NOTE — Interval H&P Note (Signed)
History and Physical Interval Note:  07/31/2021 10:28 AM  Amanda Gordon  has presented today for surgery, with the diagnosis of malignant ascites.  The various methods of treatment have been discussed with the patient and family. After consideration of risks, benefits and other options for treatment, the patient has consented to  Procedure(s): ESOPHAGOGASTRODUODENOSCOPY (EGD) WITH PROPOFOL (N/A) as a surgical intervention.  The patient's history has been reviewed, patient examined, no change in status, stable for surgery.  I have reviewed the patient's chart and labs.  Questions were answered to the patient's satisfaction.     [name]

## 2021-07-31 NOTE — Progress Notes (Signed)
Nutrition Follow-up  DOCUMENTATION CODES:   Not applicable  INTERVENTION:  - continue Boost Breeze TID. - diet advancement as medically feasible.   NUTRITION DIAGNOSIS:   Increased nutrient needs related to chronic illness as evidenced by estimated needs. -ongoing  GOAL:   Patient will meet greater than or equal to 90% of their needs -unmet, unable to meet  MONITOR:   PO intake, Supplement acceptance, Weight trends, Labs, I & O's   ASSESSMENT:   80 y.o. female with medical history significant of type 2 diabetes, unspecified anemia, hyperlipidemia, hypertension, osteoarthritis of the left knee, type 2 diabetes mellitus, class I obesity who is coming to the emergency department due to abdominal distention after first losing about 22 pounds in the month and then regaining 17 pounds with profound abdominal distention.  Patient's diet order has changed frequently between NPO, CLD, and Soft. No intakes have been documented since admission. She has been accepting Boost Breeze ~50% of the time offered.   She reports having some mashed potatoes as part of dinner last night and this was well tolerated. She had an EGD this AM and after returning to her room had a cup of broth that she tolerated well.   Patient reports she becomes nauseated very easily and frequently, but that anti-nausea medication has been helpful.   Weight today is consistent weight weight on 10/8. Non-pitting edema to BLE.   Pending MRCP.     Labs reviewed; CBGs: 131 mg/dl x2, Na: 129 mmol/l, Cl: 93 mmol/l, BUN: 29 mg/dl, creatinine: 1.27 mg/dl, Ca: 8.4 mg/dl, GFR: 43 ml/min.   Medications reviewed; sliding scale novolog, 1 tablet multivitamin with minerals/day, 40 mg oral protonix/day, 17 g miralax/day.     NUTRITION - FOCUSED PHYSICAL EXAM:  Flowsheet Row Most Recent Value  Orbital Region No depletion  Upper Arm Region Mild depletion  Thoracic and Lumbar Region Unable to assess  Buccal Region Mild  depletion  Temple Region No depletion  Clavicle Bone Region Mild depletion  Clavicle and Acromion Bone Region No depletion  Scapular Bone Region No depletion  Dorsal Hand No depletion  Patellar Region No depletion  Anterior Thigh Region Unable to assess  Posterior Calf Region No depletion  Edema (RD Assessment) Mild  [BLE]  Hair Reviewed  Eyes Reviewed  Mouth Reviewed  Skin Reviewed  Nails Reviewed       Diet Order:   Diet Order             Diet clear liquid Room service appropriate? Yes; Fluid consistency: Thin  Diet effective now                   EDUCATION NEEDS:   No education needs have been identified at this time  Skin:  Skin Assessment: Skin Integrity Issues: Skin Integrity Issues:: Incisions Incisions: 10/8 abdomen  Last BM:  10/12 (type 4)  Height:   Ht Readings from Last 1 Encounters:  07/31/21 5\' 2"  (1.575 m)    Weight:   Wt Readings from Last 1 Encounters:  07/31/21 75.1 kg     Estimated Nutritional Needs:  Kcal:  1500-1700 Protein:  65-80g Fluid:  1.7L/day     Jarome Matin, MS, RD, LDN, CNSC Inpatient Clinical Dietitian RD pager # available in [facility]  After hours/weekend pager # available in [facility]

## 2021-07-31 NOTE — Anesthesia Procedure Notes (Signed)
Procedure Name: MAC Date/Time: 07/31/2021 10:39 AM Performed by: Lollie Sails, CRNA Pre-anesthesia Checklist: Patient identified, Emergency Drugs available, Suction available, Patient being monitored and Timeout performed Oxygen Delivery Method: Simple face mask Preoxygenation: POM Used. Placement Confirmation: positive ETCO2

## 2021-07-31 NOTE — Plan of Care (Signed)
°  Problem: Elimination: °Goal: Will not experience complications related to bowel motility °Outcome: Progressing °  °Problem: Pain Managment: °Goal: General experience of comfort will improve °Outcome: Progressing °  °

## 2021-07-31 NOTE — Anesthesia Preprocedure Evaluation (Signed)
Anesthesia Evaluation  Patient identified by MRN, date of birth, ID band Patient awake    Reviewed: Allergy & Precautions, NPO status , Patient's Chart, lab work & pertinent test results  Airway Mallampati: II  TM Distance: >3 FB Neck ROM: Full    Dental no notable dental hx.    Pulmonary neg pulmonary ROS,    Pulmonary exam normal breath sounds clear to auscultation       Cardiovascular hypertension, Normal cardiovascular exam Rhythm:Regular Rate:Normal     Neuro/Psych negative neurological ROS  negative psych ROS   GI/Hepatic negative GI ROS, Neg liver ROS, peritoneal carcinomatosis    Endo/Other  diabetes  Renal/GU Renal InsufficiencyRenal disease  negative genitourinary   Musculoskeletal negative musculoskeletal ROS (+)   Abdominal   Peds negative pediatric ROS (+)  Hematology negative hematology ROS (+)   Anesthesia Other Findings   Reproductive/Obstetrics negative OB ROS                             Anesthesia Physical Anesthesia Plan  ASA: 3  Anesthesia Plan: MAC   Post-op Pain Management:    Induction: Intravenous  PONV Risk Score and Plan: 2 and Propofol infusion and Treatment may vary due to age or medical condition  Airway Management Planned: Simple Face Mask  Additional Equipment:   Intra-op Plan:   Post-operative Plan:   Informed Consent: I have reviewed the patients History and Physical, chart, labs and discussed the procedure including the risks, benefits and alternatives for the proposed anesthesia with the patient or authorized representative who has indicated his/her understanding and acceptance.     Dental advisory given  Plan Discussed with: CRNA and Surgeon  Anesthesia Plan Comments:         Anesthesia Quick Evaluation

## 2021-07-31 NOTE — Progress Notes (Signed)
PT Cancellation Note  Patient Details Name: Amanda Gordon MRN: [id number] DOB: Apr 18, 1941   Cancelled Treatment:    Reason Eval/Treat Not Completed: Patient declined, no reason specified (Pt declined due to Lt knee pain, and abdominal pain. Will follow up at later date/time as schedule allows.)   Gwynneth Albright PT, DPT Acute Rehabilitation Services Office [phone] Pager [phone]

## 2021-07-31 NOTE — Progress Notes (Signed)
PROGRESS NOTE  Amanda Gordon DQQ:[id number] DOB: 1941-06-03 DOA: 07/24/2021 PCP: System, Provider Not In   LOS: 5 days   Brief Narrative / Interim history: Amanda Gordon is a 80 y.o. female with medical history significant of type 2 diabetes, unspecified anemia, hyperlipidemia, hypertension, osteoarthritis of the left knee, type 2 diabetes mellitus, class I obesity presented to the hospital with abdominal distention weight loss with dry history constipation and episode of hematochezia.  CT scan of the abdomen and pelvis showed ascites with enlarged retroperitoneal lymph nodes so patient was admitted hospital for further evaluation and treatment  Subjective / 24h Interval events: Still has some abdominal discomfort, especially when she gets up and moves around  Assessment & Plan: Principal Problem Abdominal pain nausea with ascites -Had concerns for carcinomatosis and had been seen by oncology.  CEA CA 19-9 and CA125 elevated.  Transvaginal ultrasound showed 4.62 cm right ovarian cyst somewhat complex indeterminate for malignancy.  Discussed with Dr. Irene Limbo, he reviewed cytology and shows malignant cells consistent with malignant ascites, and preliminary immunohistochemical markers suggesting an upper GI source -GI consulted, underwent an EGD today which was unrevealing.  An MRI/MRCP has been ordered by GI and pending -Hold Lasix for today due to mild creatinine elevation, will likely need to be placed on oral diuretics tomorrow  Active Problems Borderline prolonged QT interval-Continue telemetry.  Continue to replenish electrolytes as necessary.   Hypokalemia-improved, now normal   Hyponatremia, hypovolemic -sodium slightly low today, hold further Lasix doses.   Mild protein malnutrition -Likely secondary to underlying malignancy.  Oral nutrition.   Hypertension -Continue metoprolol, hold lisinopril due to slight elevation of her creatinine   Hyperlipidemia-Not on treatment.   Type  2 diabetes mellitus  -Continue sliding scale insulin, Accu-Cheks, diabetic diet.  Scheduled Meds:  amLODipine  10 mg Oral Daily   chlorhexidine  15 mL Mouth Rinse BID   feeding supplement  1 Container Oral TID BM   insulin aspart  0-5 Units Subcutaneous QHS   insulin aspart  0-9 Units Subcutaneous TID WC   mouth rinse  15 mL Mouth Rinse q12n4p   metoprolol succinate  100 mg Oral QHS   multivitamin with minerals  1 tablet Oral Daily   pantoprazole  40 mg Oral Daily   polyethylene glycol  17 g Oral Daily   Continuous Infusions: PRN Meds:.acetaminophen **OR** acetaminophen, bisacodyl, lip balm, ondansetron, prochlorperazine  Diet Orders (From admission, onward)     Start     Ordered   07/31/21 1108  Diet clear liquid Room service appropriate? Yes; Fluid consistency: Thin  Diet effective now       Question Answer Comment  Room service appropriate? Yes   Fluid consistency: Thin      07/31/21 1107            DVT prophylaxis: SCDs Start: 07/24/21 1637     Code Status: Full Code  Family Communication: No family at bedside  Status is: Inpatient  Remains inpatient appropriate because:Inpatient level of care appropriate due to severity of illness  Dispo: The patient is from: Home              Anticipated d/c is to: Home              Patient currently is not medically stable to d/c.   Difficult to place patient No  Level of care: Med-Surg  Consultants:  Oncology   Procedures:  none  Microbiology  none  Antimicrobials: none  Objective: Vitals:   07/31/21 1106 07/31/21 1110 07/31/21 1111 07/31/21 1120  BP: (!) 83/62 (!) 90/55 (!) 90/55 97/62  Pulse: 65  64 64  Resp: (!) 25  (!) 23 17  Temp: (!) 97.4 F (36.3 C)     TempSrc: Oral     SpO2: 99%  96% 94%  Weight:      Height:        Intake/Output Summary (Last 24 hours) at 07/31/2021 1254 Last data filed at 07/31/2021 1102 Gross per 24 hour  Intake 688.08 ml  Output --  Net 688.08 ml    Filed  Weights   07/24/21 1527 07/25/21 0011 07/31/21 0922  Weight: 79.2 kg 75.1 kg 75.1 kg    Examination:  Constitutional: She is in no distress, in bed Eyes: Anicteric ENMT: Moist mucous membranes Neck: normal, supple Respiratory: Clear to auscultation bilaterally without wheezing or crackles, normal respiratory effort Cardiovascular: Regular rate and rhythm, no murmurs, no peripheral edema Abdomen: Soft, NT, ND, bowel sounds positive Musculoskeletal: no clubbing / cyanosis.  Skin: No rashes seen Neurologic: No focal deficits   Data Reviewed: I have independently reviewed following labs and imaging studies  CBC: Recent Labs  Lab 07/27/21 0348 07/28/21 0328 07/29/21 0337 07/30/21 0321 07/31/21 0319  WBC 8.6 9.0 10.4 10.1 9.2  HGB 12.9 13.7 13.5 13.4 12.8  HCT 39.1 42.9 41.7 41.1 39.2  MCV 83.5 85.3 84.6 83.4 83.9  PLT 382 410* 441* 448* 629    Basic Metabolic Panel: Recent Labs  Lab 07/24/21 1640 07/25/21 0347 07/27/21 0348 07/28/21 0328 07/29/21 0337 07/30/21 0321 07/31/21 0319  NA  --    < > 139 133* 133* 137 129*  K  --    < > 4.8 4.3 4.2 4.2 4.2  CL  --    < > 102 96* 96* 99 93*  CO2  --    < > 28 28 26 27 27   GLUCOSE  --    < > 92 128* 137* 116* 115*  BUN  --    < > 14 15 20 23  29*  CREATININE  --    < > 0.62 0.80 0.74 0.94 1.27*  CALCIUM  --    < > 8.8* 8.4* 8.7* 9.2 8.4*  MG 1.9  --   --   --   --   --   --   PHOS 4.0  --   --   --   --   --   --    < > = values in this interval not displayed.    Liver Function Tests: Recent Labs  Lab 07/26/21 0347 07/27/21 0348 07/28/21 0328 07/29/21 0337 07/30/21 0321  AST 17 13* 15 17 18   ALT 8 8 8 8 8   ALKPHOS 79 80 82 81 82  BILITOT 0.7 0.4 0.7 0.5 0.4  PROT 5.5* 5.2* 5.4* 5.7* 5.7*  ALBUMIN 2.6* 2.4* 2.4* 2.4* 2.4*    Coagulation Profile: No results for input(s): INR, PROTIME in the last 168 hours. HbA1C: No results for input(s): HGBA1C in the last 72 hours. CBG: Recent Labs  Lab 07/30/21 1136  07/30/21 1732 07/30/21 1955 07/31/21 0746 07/31/21 1144  GLUCAP 135* 167* 139* 131* 131*     Recent Results (from the past 240 hour(s))  Surgical pcr screen     Status: None   Collection Time: 07/23/21  2:29 PM   Specimen: Nasal Mucosa; Nasal Swab  Result Value Ref Range Status   MRSA, PCR NEGATIVE  NEGATIVE Final   Staphylococcus aureus NEGATIVE NEGATIVE Final    Comment: (NOTE) The Xpert SA Assay (FDA approved for NASAL specimens in patients 35 years of age and older), is one component of a comprehensive surveillance program. It is not intended to diagnose infection nor to guide or monitor treatment. Performed at [facility], [place] [address], [place], [place] [address]   Urine Culture     Status: Abnormal   Collection Time: 07/23/21  2:29 PM   Specimen: Urine, Clean Catch  Result Value Ref Range Status   Specimen Description   Final    URINE, CLEAN CATCH Performed at [facility], [place] [address], [place], [place] [address]    Special Requests   Final    NONE Performed at [facility], [place] [address], [place], [place] [address]    Culture (A)  Final    <10,000 COLONIES/mL INSIGNIFICANT GROWTH Performed at [place] [address] [place], [place] [address]    Report Status 07/24/2021 FINAL  Final  Resp Panel by RT-PCR (Flu A&B, Covid) Nasopharyngeal Swab     Status: None   Collection Time: 07/24/21  4:16 PM   Specimen: Nasopharyngeal Swab; Nasopharyngeal(NP) swabs in vial transport medium  Result Value Ref Range Status   SARS Coronavirus 2 by RT PCR NEGATIVE NEGATIVE Final    Comment: (NOTE) SARS-CoV-2 target nucleic acids are NOT DETECTED.  The SARS-CoV-2 RNA is generally detectable in upper respiratory specimens during the acute phase of infection. The lowest concentration of SARS-CoV-2 viral copies this assay can detect is 138 copies/mL. A negative result does not preclude SARS-Cov-2 infection  and should not be used as the sole basis for treatment or other patient management decisions. A negative result may occur with  improper specimen collection/handling, submission of specimen other than nasopharyngeal swab, presence of viral mutation(s) within the areas targeted by this assay, and inadequate number of viral copies(<138 copies/mL). A negative result must be combined with clinical observations, patient history, and epidemiological information. The expected result is Negative.  Fact Sheet for Patients:  EntrepreneurPulse.com.au  Fact Sheet for Healthcare Providers:  IncredibleEmployment.be  This test is no t yet approved or cleared by the [place] FDA and  has been authorized for detection and/or diagnosis of SARS-CoV-2 by FDA under an Emergency Use Authorization (EUA). This EUA will remain  in effect (meaning this test can be used) for the duration of the COVID-19 declaration under Section 564(b)(1) of the Act, 21 U.S.C.section 360bbb-3(b)(1), unless the authorization is terminated  or revoked sooner.       Influenza A by PCR NEGATIVE NEGATIVE Final   Influenza B by PCR NEGATIVE NEGATIVE Final    Comment: (NOTE) The Xpert Xpress SARS-CoV-2/FLU/RSV plus assay is intended as an aid in the diagnosis of influenza from Nasopharyngeal swab specimens and should not be used as a sole basis for treatment. Nasal washings and aspirates are unacceptable for Xpert Xpress SARS-CoV-2/FLU/RSV testing.  Fact Sheet for Patients: EntrepreneurPulse.com.au  Fact Sheet for Healthcare Providers: IncredibleEmployment.be  This test is not yet approved or cleared by the [place] FDA and has been authorized for detection and/or diagnosis of SARS-CoV-2 by FDA under an Emergency Use Authorization (EUA). This EUA will remain in effect (meaning this test can be used) for the duration of the COVID-19 declaration  under Section 564(b)(1) of the Act, 21 U.S.C. section 360bbb-3(b)(1), unless the authorization is terminated or revoked.  Performed at [facility], [place] [address], [place], [place] [address]       Radiology Studies: No results found.  Marzetta Board, MD, PhD [facility]  Between 7 am - 7 pm I am available, please contact me via Amion (for emergencies) or Securechat (non urgent messages)  Between 7 pm - 7 am I am not available, please contact night coverage MD/APP via Amion

## 2021-07-31 NOTE — Anesthesia Postprocedure Evaluation (Signed)
Anesthesia Post Note  Patient: Avionna Bower Spindel  Procedure(s) Performed: ESOPHAGOGASTRODUODENOSCOPY (EGD) WITH PROPOFOL BIOPSY     Patient location during evaluation: PACU Anesthesia Type: MAC Level of consciousness: awake and alert Pain management: pain level controlled Vital Signs Assessment: post-procedure vital signs reviewed and stable Respiratory status: spontaneous breathing, nonlabored ventilation, respiratory function stable and patient connected to nasal cannula oxygen Cardiovascular status: stable and blood pressure returned to baseline Postop Assessment: no apparent nausea or vomiting Anesthetic complications: no   No notable events documented.  Last Vitals:  Vitals:   07/31/21 1111 07/31/21 1120  BP: (!) 90/55 97/62  Pulse: 64 64  Resp: (!) 23 17  Temp:    SpO2: 96% 94%    Last Pain:  Vitals:   07/31/21 1106  TempSrc: Oral  PainSc:                  Mykaila Blunck S

## 2021-08-01 DIAGNOSIS — N83201 Unspecified ovarian cyst, right side: Secondary | ICD-10-CM | POA: Diagnosis not present

## 2021-08-01 DIAGNOSIS — C800 Disseminated malignant neoplasm, unspecified: Secondary | ICD-10-CM | POA: Diagnosis not present

## 2021-08-01 DIAGNOSIS — E785 Hyperlipidemia, unspecified: Secondary | ICD-10-CM | POA: Diagnosis not present

## 2021-08-01 DIAGNOSIS — R180 Malignant ascites: Secondary | ICD-10-CM | POA: Diagnosis not present

## 2021-08-01 LAB — GLUCOSE, CAPILLARY
Glucose-Capillary: 117 mg/dL — ABNORMAL HIGH (ref 70–99)
Glucose-Capillary: 134 mg/dL — ABNORMAL HIGH (ref 70–99)
Glucose-Capillary: 133 mg/dL — ABNORMAL HIGH (ref 70–99)
Glucose-Capillary: 121 mg/dL — ABNORMAL HIGH (ref 70–99)

## 2021-08-01 LAB — BASIC METABOLIC PANEL
Anion gap: 9 (ref 5–15)
BUN: 29 mg/dL — ABNORMAL HIGH (ref 8–23)
CO2: 28 mmol/L (ref 22–32)
Calcium: 8.5 mg/dL — ABNORMAL LOW (ref 8.9–10.3)
Chloride: 92 mmol/L — ABNORMAL LOW (ref 98–111)
Creatinine, Ser: 1.04 mg/dL — ABNORMAL HIGH (ref 0.44–1.00)
GFR, Estimated: 55 mL/min — ABNORMAL LOW (ref >60)
Glucose, Bld: 113 mg/dL — ABNORMAL HIGH (ref 70–99)
Potassium: 4.2 mmol/L (ref 3.5–5.1)
Sodium: 129 mmol/L — ABNORMAL LOW (ref 135–145)

## 2021-08-01 MED ORDER — MEGESTROL ACETATE 40 MG PO TABS
40.0000 mg | ORAL_TABLET | Freq: Two times a day (BID) | ORAL | Status: DC
  Administered 2021-08-01 – 2021-08-05 (×9): 40 mg via ORAL
  Filled 2021-08-01 (×9): qty 1

## 2021-08-01 MED ORDER — IBUPROFEN 200 MG PO TABS
200.0000 mg | ORAL_TABLET | ORAL | Status: DC | PRN
  Administered 2021-08-01 – 2021-08-02 (×4): 200 mg via ORAL
  Filled 2021-08-01 (×4): qty 1

## 2021-08-01 NOTE — Evaluation (Signed)
Physical Therapy Evaluation Patient Details Name: Amanda Gordon [employer] MRN: [id number] DOB: 1941-09-07 Today's Date: 08/01/2021  History of Present Illness  80 y.o. female with medical history significant of type 2 diabetes, unspecified anemia, hyperlipidemia, hypertension, osteoarthritis of the left knee, type 2 diabetes mellitus, class I obesity presented to the hospital with abdominal distention weight loss with dry history constipation  and episode of hematochezia.  CT scan of the abdomen and pelvis showed ascites with enlarged retroperitoneal lymph nodes  Clinical Impression  .Pt admitted with above diagnosis. Pt currently with functional limitations due to the deficits listed below (see PT Problem List). Pt will benefit from skilled PT to increase their independence and safety with mobility to allow discharge to the venue listed below.  She was nauseous at the eval, but moved fairly well and feel she could have walked more if not for the nausea. Recommend a RW for home due to L knee pain.       Recommendations for follow up therapy are one component of a multi-disciplinary discharge planning process, led by the attending physician.  Recommendations may be updated based on patient status, additional functional criteria and insurance authorization.  Follow Up Recommendations No PT follow up    Equipment Recommendations  Rolling walker with 5" wheels    Recommendations for Other Services       Precautions / Restrictions Precautions Precautions: None Restrictions Weight Bearing Restrictions: No      Mobility  Bed Mobility Overal bed mobility: Needs Assistance Bed Mobility: Rolling;Sidelying to Sit Rolling: Supervision Sidelying to sit: Min guard       General bed mobility comments: cues for technique    Transfers Overall transfer level: Needs assistance Equipment used: Rolling walker (2 wheeled) Transfers: Sit to/from [facility] Sit to Stand:  Supervision Stand pivot transfers: Supervision       General transfer comment: cues for hand placement  Ambulation/Gait             General Gait Details: Pt nauseous and did not feel she was up for walking.  Did SPT with S and feel if it weren't for nausea she could have ambulated with S to min/guard with RW.  Stairs            Wheelchair Mobility    Modified Rankin (Stroke Patients Only)       Balance Overall balance assessment: No apparent balance deficits (not formally assessed)                                           Pertinent Vitals/Pain Pain Assessment: Faces Pain Location: abdomen and L knee Pain Intervention(s): Patient requesting pain meds-RN notified    Home Living Family/patient expects to be discharged to:: Private residence Living Arrangements: Alone Available Help at Discharge: Family;Available PRN/intermittently Type of Home: House Home Access: Stairs to enter Entrance Stairs-Rails: Right Entrance Stairs-Number of Steps: 4 Home Layout: One level Home Equipment: Cane - single point      Prior Function Level of Independence: Independent               Hand Dominance        Extremity/Trunk Assessment   Upper Extremity Assessment Upper Extremity Assessment: Overall WFL for tasks assessed    Lower Extremity Assessment Lower Extremity Assessment: Overall WFL for tasks assessed;LLE deficits/detail LLE Deficits / Details: L knee discomfort  Communication      Cognition Arousal/Alertness: Awake/alert Behavior During Therapy: WFL for tasks assessed/performed                                          General Comments      Exercises     Assessment/Plan    PT Assessment Patient needs continued PT services  PT Problem List Decreased activity tolerance;Decreased mobility       PT Treatment Interventions DME instruction;Gait training;Functional mobility training;Stair  training;Therapeutic exercise;Therapeutic activities    PT Goals (Current goals can be found in the Care Plan section)  Acute Rehab PT Goals Patient Stated Goal: Agreeable to PT PT Goal Formulation: With patient/family Time For Goal Achievement: 08/15/21 Potential to Achieve Goals: Good    Frequency Min 3X/week   Barriers to discharge        Co-evaluation               AM-PAC PT "6 Clicks" Mobility  Outcome Measure Help needed turning from your back to your side while in a flat bed without using bedrails?: A Little Help needed moving from lying on your back to sitting on the side of a flat bed without using bedrails?: A Little Help needed moving to and from a bed to a chair (including a wheelchair)?: A Little Help needed standing up from a chair using your arms (e.g., wheelchair or bedside chair)?: A Little Help needed to walk in hospital room?: A Little Help needed climbing 3-5 steps with a railing? : A Little 6 Click Score: 18    End of Session Equipment Utilized During Treatment: Gait belt Activity Tolerance: Treatment limited secondary to medical complications (Comment) (nausea)   Nurse Communication: Mobility status;Other (comment) (request for nausea meds) PT Visit Diagnosis: Difficulty in walking, not elsewhere classified (R26.2)    Time: 3016-0109 PT Time Calculation (min) (ACUTE ONLY): 23 min   Charges:   PT Evaluation $PT Eval Low Complexity: 1 Low PT Treatments $Therapeutic Activity: 8-22 mins        Amanda Gordon L. Amanda Gordon, [place]  08/01/2021   Amanda Gordon 08/01/2021, 12:36 PM

## 2021-08-01 NOTE — Plan of Care (Signed)
  Problem: Clinical Measurements: Goal: Diagnostic test results will improve Outcome: Progressing   Problem: Activity: Goal: Risk for activity intolerance will decrease Outcome: Progressing   Problem: Nutrition: Goal: Adequate nutrition will be maintained Outcome: Progressing   Problem: Pain Managment: Goal: General experience of comfort will improve Outcome: Progressing   

## 2021-08-01 NOTE — Progress Notes (Signed)
[facility] Gastroenterology Progress Note  Amanda Gordon 80 y.o. 01/01/1941  CC: Malignant ascites   Subjective: Patient seen and examined at bedside.  Complaining of weakness and decreased appetite.  Denies abdominal pain, nausea and vomiting.  ROS : Positive for fatigue.  Negative for chest pain   Objective: Vital signs in last 24 hours: Vitals:   07/31/21 2112 08/01/21 0500  BP: 96/60 109/73  Pulse: 86 83  Resp:  20  Temp:  97.9 F (36.6 C)  SpO2:  93%    Physical Exam:  General:  Alert, cooperative, no distress, appears stated age  Head:  Normocephalic, without obvious abnormality, atraumatic  Eyes:  , EOM's intact,   Lungs:   No visible respiratory distress  Heart:  Regular rate and rhythm, S1, S2 normal  Abdomen Distended with ascites, bowel sounds present, nontender, no peritoneal sign  Neuro Alert and oriented x3  Psych Somewhat depressed    Lab Results: Recent Labs    07/31/21 0319 08/01/21 0334  NA 129* 129*  K 4.2 4.2  CL 93* 92*  CO2 27 28  GLUCOSE 115* 113*  BUN 29* 29*  CREATININE 1.27* 1.04*  CALCIUM 8.4* 8.5*   Recent Labs    07/30/21 0321  AST 18  ALT 8  ALKPHOS 82  BILITOT 0.4  PROT 5.7*  ALBUMIN 2.4*   Recent Labs    07/30/21 0321 07/31/21 0319  WBC 10.1 9.2  HGB 13.4 12.8  HCT 41.1 39.2  MCV 83.4 83.9  PLT 448* 397   No results for input(s): LABPROT, INR in the last 72 hours.    Assessment/Plan: -Malignant ascites.  Elevated CEA, CA125 and CA 19-9.  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  EGD yesterday showed gastritis and abnormal mucosa in the duodenal bulb.  Biopsies taken.  MRI MRCP showed similar finding of thickening in the gastric antrum more in the duodenal bulb otherwise normal-appearing pancreas.  Recommendation ------------------------ -Continue supportive care -Follow EGD biopsy report -[facility] will follow up on Monday -Advance diet to regular   Otis Brace MD,  FACP 08/01/2021, 8:30 AM  Contact #  [phone]

## 2021-08-01 NOTE — Progress Notes (Addendum)
PROGRESS NOTE  Amanda Gordon EGB:[id number] DOB: 1940/12/05 DOA: 07/24/2021 PCP: System, Provider Not In   LOS: 6 days   Brief Narrative / Interim history: Amanda Gordon is a 80 y.o. female with medical history significant of type 2 diabetes, unspecified anemia, hyperlipidemia, hypertension, osteoarthritis of the left knee, type 2 diabetes mellitus, class I obesity presented to the hospital with abdominal distention weight loss with dry history constipation and episode of hematochezia.  CT scan of the abdomen and pelvis showed ascites with enlarged retroperitoneal lymph nodes so patient was admitted hospital for further evaluation and treatment  Subjective / 24h Interval events: Was unable to work with physical therapy yesterday due to left knee pain.  She tells me she was supposed to get her knee replaced but now is on hold  Assessment & Plan: Principal Problem Abdominal pain nausea with ascites -Had concerns for carcinomatosis and had been seen by oncology.  CEA CA 19-9 and CA125 elevated.  Transvaginal ultrasound showed 4.62 cm right ovarian cyst somewhat complex indeterminate for malignancy.  Discussed with Dr. Irene Limbo, he reviewed cytology and shows malignant cells consistent with malignant ascites, and preliminary immunohistochemical markers suggesting an upper GI source -GI consulted, underwent an EGD on 10/14 which was unrevealing.  An MRI MRCP done 10/14 showed mild wall thickening in the region of the gastric antrum/duodenal bulb, poorly evaluated.  Biopsies from the EGD pending -Ascites seems to be a barrier for her participating consistently in physical therapy, I requested a repeat ultrasound-guided paracentesis, likely Monday.  Hold diuretics due to mild creatinine elevation  Active Problems Borderline prolonged QT interval-Continue telemetry.  Continue to replenish electrolytes as necessary.   Hypokalemia-improved, now normal   Hyponatremia, hypovolemic -sodium stable since  yesterday, hold further Lasix for now   Mild protein malnutrition -Likely secondary to underlying malignancy.  Oral nutrition.  Continues to have a poor appetite, since this is likely malignancy induced start Megace   Hypertension -Continue metoprolol, hold lisinopril due to slight elevation of her creatinine   Hyperlipidemia-Not on treatment.   Type 2 diabetes mellitus  -Continue sliding scale insulin, Accu-Cheks, diabetic diet.  Scheduled Meds:  amLODipine  10 mg Oral Daily   chlorhexidine  15 mL Mouth Rinse BID   feeding supplement  1 Container Oral TID BM   insulin aspart  0-5 Units Subcutaneous QHS   insulin aspart  0-9 Units Subcutaneous TID WC   mouth rinse  15 mL Mouth Rinse q12n4p   megestrol  40 mg Oral BID   metoprolol succinate  100 mg Oral QHS   multivitamin with minerals  1 tablet Oral Daily   pantoprazole  40 mg Oral Daily   polyethylene glycol  17 g Oral Daily   Continuous Infusions: PRN Meds:.acetaminophen **OR** acetaminophen, bisacodyl, ibuprofen, lip balm, ondansetron, prochlorperazine  Diet Orders (From admission, onward)     Start     Ordered   08/01/21 0834  Diet regular Room service appropriate? Yes; Fluid consistency: Thin  Diet effective now       Question Answer Comment  Room service appropriate? Yes   Fluid consistency: Thin      08/01/21 0833            DVT prophylaxis: SCDs Start: 07/24/21 1637     Code Status: Full Code  Family Communication: No family at bedside  Status is: Inpatient  Remains inpatient appropriate because:Inpatient level of care appropriate due to severity of illness  Dispo: The patient is from: Home  Anticipated d/c is to: Home              Patient currently is not medically stable to d/c.   Difficult to place patient No  Level of care: Med-Surg  Consultants:  Oncology   Procedures:  none  Microbiology  none  Antimicrobials: none    Objective: Vitals:   07/31/21 1306 07/31/21 1900  07/31/21 2112 08/01/21 0500  BP: (!) 91/58 90/64 96/60  109/73  Pulse: 68 82 86 83  Resp: 18 19  20   Temp:  98.1 F (36.7 C)  97.9 F (36.6 C)  TempSrc:    Oral  SpO2:  94%  93%  Weight:      Height:        Intake/Output Summary (Last 24 hours) at 08/01/2021 1127 Last data filed at 08/01/2021 1058 Gross per 24 hour  Intake 600 ml  Output 250 ml  Net 350 ml    Filed Weights   07/24/21 1527 07/25/21 0011 07/31/21 0922  Weight: 79.2 kg 75.1 kg 75.1 kg    Examination:  Constitutional: NAD, in bed Eyes: No scleral icterus ENMT: Moist mucous membranes Neck: normal, supple Respiratory: Clear to auscultation bilaterally, diminished at the bases but no wheezing or crackles Cardiovascular: Regular rate and rhythm, no murmurs, no edema Abdomen: Mild distention noted, mild tenderness to palpation but no guarding or rebound. Musculoskeletal: no clubbing / cyanosis.  Skin: No rashes seen Neurologic: Nonfocal, equal strength   Data Reviewed: I have independently reviewed following labs and imaging studies  CBC: Recent Labs  Lab 07/27/21 0348 07/28/21 0328 07/29/21 0337 07/30/21 0321 07/31/21 0319  WBC 8.6 9.0 10.4 10.1 9.2  HGB 12.9 13.7 13.5 13.4 12.8  HCT 39.1 42.9 41.7 41.1 39.2  MCV 83.5 85.3 84.6 83.4 83.9  PLT 382 410* 441* 448* 768    Basic Metabolic Panel: Recent Labs  Lab 07/28/21 0328 07/29/21 0337 07/30/21 0321 07/31/21 0319 08/01/21 0334  NA 133* 133* 137 129* 129*  K 4.3 4.2 4.2 4.2 4.2  CL 96* 96* 99 93* 92*  CO2 28 26 27 27 28   GLUCOSE 128* 137* 116* 115* 113*  BUN 15 20 23  29* 29*  CREATININE 0.80 0.74 0.94 1.27* 1.04*  CALCIUM 8.4* 8.7* 9.2 8.4* 8.5*    Liver Function Tests: Recent Labs  Lab 07/26/21 0347 07/27/21 0348 07/28/21 0328 07/29/21 0337 07/30/21 0321  AST 17 13* 15 17 18   ALT 8 8 8 8 8   ALKPHOS 79 80 82 81 82  BILITOT 0.7 0.4 0.7 0.5 0.4  PROT 5.5* 5.2* 5.4* 5.7* 5.7*  ALBUMIN 2.6* 2.4* 2.4* 2.4* 2.4*    Coagulation  Profile: No results for input(s): INR, PROTIME in the last 168 hours. HbA1C: No results for input(s): HGBA1C in the last 72 hours. CBG: Recent Labs  Lab 07/31/21 0746 07/31/21 1144 07/31/21 1643 07/31/21 2053 08/01/21 0738  GLUCAP 131* 131* 116* 125* 117*     Recent Results (from the past 240 hour(s))  Surgical pcr screen     Status: None   Collection Time: 07/23/21  2:29 PM   Specimen: Nasal Mucosa; Nasal Swab  Result Value Ref Range Status   MRSA, PCR NEGATIVE NEGATIVE Final   Staphylococcus aureus NEGATIVE NEGATIVE Final    Comment: (NOTE) The Xpert SA Assay (FDA approved for NASAL specimens in patients 87 years of age and older), is one component of a comprehensive surveillance program. It is not intended to diagnose infection nor to guide or monitor treatment.  Performed at [facility], [place] [address], [place], [place] [address]   Urine Culture     Status: Abnormal   Collection Time: 07/23/21  2:29 PM   Specimen: Urine, Clean Catch  Result Value Ref Range Status   Specimen Description   Final    URINE, CLEAN CATCH Performed at [facility], [place] [address] [place], [place] [address]    Special Requests   Final    NONE Performed at [facility], [place] [address] [place], [place] [address]    Culture (A)  Final    <10,000 COLONIES/mL INSIGNIFICANT GROWTH Performed at [facility] [address] [place], [place] [address]    Report Status 07/24/2021 FINAL  Final  Resp Panel by RT-PCR (Flu A&B, Covid) Nasopharyngeal Swab     Status: None   Collection Time: 07/24/21  4:16 PM   Specimen: Nasopharyngeal Swab; Nasopharyngeal(NP) swabs in vial transport medium  Result Value Ref Range Status   SARS Coronavirus 2 by RT PCR NEGATIVE NEGATIVE Final    Comment: (NOTE) SARS-CoV-2 target nucleic acids are NOT DETECTED.  The SARS-CoV-2 RNA is generally detectable in upper respiratory specimens during the  acute phase of infection. The lowest concentration of SARS-CoV-2 viral copies this assay can detect is 138 copies/mL. A negative result does not preclude SARS-Cov-2 infection and should not be used as the sole basis for treatment or other patient management decisions. A negative result may occur with  improper specimen collection/handling, submission of specimen other than nasopharyngeal swab, presence of viral mutation(s) within the areas targeted by this assay, and inadequate number of viral copies(<138 copies/mL). A negative result must be combined with clinical observations, patient history, and epidemiological information. The expected result is Negative.  Fact Sheet for Patients:  EntrepreneurPulse.com.au  Fact Sheet for Healthcare Providers:  IncredibleEmployment.be  This test is no t yet approved or cleared by the [place] FDA and  has been authorized for detection and/or diagnosis of SARS-CoV-2 by FDA under an Emergency Use Authorization (EUA). This EUA will remain  in effect (meaning this test can be used) for the duration of the COVID-19 declaration under Section 564(b)(1) of the Act, 21 U.S.C.section 360bbb-3(b)(1), unless the authorization is terminated  or revoked sooner.       Influenza A by PCR NEGATIVE NEGATIVE Final   Influenza B by PCR NEGATIVE NEGATIVE Final    Comment: (NOTE) The Xpert Xpress SARS-CoV-2/FLU/RSV plus assay is intended as an aid in the diagnosis of influenza from Nasopharyngeal swab specimens and should not be used as a sole basis for treatment. Nasal washings and aspirates are unacceptable for Xpert Xpress SARS-CoV-2/FLU/RSV testing.  Fact Sheet for Patients: EntrepreneurPulse.com.au  Fact Sheet for Healthcare Providers: IncredibleEmployment.be  This test is not yet approved or cleared by the [place] FDA and has been authorized for detection and/or  diagnosis of SARS-CoV-2 by FDA under an Emergency Use Authorization (EUA). This EUA will remain in effect (meaning this test can be used) for the duration of the COVID-19 declaration under Section 564(b)(1) of the Act, 21 U.S.C. section 360bbb-3(b)(1), unless the authorization is terminated or revoked.  Performed at [facility], [place] [address], [place],  [address]       Radiology Studies: MR 3D Recon At Scanner  Result Date: 07/31/2021 CLINICAL DATA:  Adenocarcinoma of unknown primary, upper GI primary favored. EXAM: MRI ABDOMEN WITHOUT AND WITH CONTRAST (INCLUDING MRCP) TECHNIQUE: Multiplanar multisequence MR imaging of the  abdomen was performed both before and after the administration of intravenous contrast. Heavily T2-weighted images of the biliary and pancreatic ducts were obtained, and three-dimensional MRCP images were rendered by post processing. CONTRAST:  98mL GADAVIST GADOBUTROL 1 MMOL/ML IV SOLN COMPARISON:  CT abdomen/pelvis dated 07/24/2021 FINDINGS: Extremely motion degraded images leading to markedly limited evaluation. Lower chest: Moderate right and small left pleural effusions. Associated lower lobe atelectasis. Hepatobiliary: No focal hepatic lesion is seen. Status post cholecystectomy. No intrahepatic or extrahepatic ductal dilatation. Pancreas:  No parenchymal atrophy or ductal dilatation. Spleen:  Grossly unremarkable. Adrenals/Urinary Tract:  Adrenal glands are within normal limits. Kidneys are grossly unremarkable.  No hydronephrosis. Stomach/Bowel: Mild wall thickening in the region of the gastric antrum/duodenal bulb, poorly evaluated. The visualized bowel is grossly unremarkable. Vascular/Lymphatic:  No evidence of abdominal aortic aneurysm. Small upper abdominal/retroperitoneal lymph nodes, better evaluated on CT. 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 Musculoskeletal: No focal osseous lesions.  IMPRESSION: Markedly limited evaluation due to motion degradation. 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 Small upper abdominal/retroperitoneal lymph nodes, better evaluated on CT. Mild wall thickening in the region of the gastric antrum/duodenal bulb, poorly evaluated. Correlate with recent endoscopy. Moderate right and small left pleural effusions. Associated lower lobe atelectasis. Electronically Signed   By: Julian Hy M.D.   On: 07/31/2021 21:21   MR ABDOMEN MRCP W WO CONTAST  Result Date: 07/31/2021 CLINICAL DATA:  Adenocarcinoma of unknown primary, upper GI primary favored. EXAM: MRI ABDOMEN WITHOUT AND WITH CONTRAST (INCLUDING MRCP) TECHNIQUE: Multiplanar multisequence MR imaging of the abdomen was performed both before and after the administration of intravenous contrast. Heavily T2-weighted images of the biliary and pancreatic ducts were obtained, and three-dimensional MRCP images were rendered by post processing. CONTRAST:  11mL GADAVIST GADOBUTROL 1 MMOL/ML IV SOLN COMPARISON:  CT abdomen/pelvis dated 07/24/2021 FINDINGS: Extremely motion degraded images leading to markedly limited evaluation. Lower chest: Moderate right and small left pleural effusions. Associated lower lobe atelectasis. Hepatobiliary: No focal hepatic lesion is seen. Status post cholecystectomy. No intrahepatic or extrahepatic ductal dilatation. Pancreas:  No parenchymal atrophy or ductal dilatation. Spleen:  Grossly unremarkable. Adrenals/Urinary Tract:  Adrenal glands are within normal limits. Kidneys are grossly unremarkable.  No hydronephrosis. Stomach/Bowel: Mild wall thickening in the region of the gastric antrum/duodenal bulb, poorly evaluated. The visualized bowel is grossly unremarkable. Vascular/Lymphatic:  No evidence of abdominal aortic aneurysm. Small upper abdominal/retroperitoneal lymph nodes, better evaluated on CT. Other: Large volume abdominal ascites, known to be malignant following  paracentesis. Musculoskeletal: No focal osseous lesions. IMPRESSION: Markedly limited evaluation due to motion degradation. 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 Small upper abdominal/retroperitoneal lymph nodes, better evaluated on CT. Mild wall thickening in the region of the gastric antrum/duodenal bulb, poorly evaluated. Correlate with recent endoscopy. Moderate right and small left pleural effusions. Associated lower lobe atelectasis. Electronically Signed   By: Julian Hy M.D.   On: 07/31/2021 21:21    Marzetta Board, MD, PhD [facility]  Between 7 am - 7 pm I am available, please contact me via Amion (for emergencies) or Securechat (non urgent messages)  Between 7 pm - 7 am I am not available, please contact night coverage MD/APP via Amion

## 2021-08-01 NOTE — Plan of Care (Signed)

## 2021-08-02 DIAGNOSIS — N83201 Unspecified ovarian cyst, right side: Secondary | ICD-10-CM | POA: Diagnosis not present

## 2021-08-02 DIAGNOSIS — E785 Hyperlipidemia, unspecified: Secondary | ICD-10-CM | POA: Diagnosis not present

## 2021-08-02 DIAGNOSIS — R180 Malignant ascites: Secondary | ICD-10-CM | POA: Diagnosis not present

## 2021-08-02 DIAGNOSIS — C800 Disseminated malignant neoplasm, unspecified: Secondary | ICD-10-CM | POA: Diagnosis not present

## 2021-08-02 LAB — COMPREHENSIVE METABOLIC PANEL
ALT: 10 U/L (ref 0–44)
AST: 19 U/L (ref 15–41)
Albumin: 2.5 g/dL — ABNORMAL LOW (ref 3.5–5.0)
Alkaline Phosphatase: 79 U/L (ref 38–126)
Anion gap: 13 (ref 5–15)
BUN: 27 mg/dL — ABNORMAL HIGH (ref 8–23)
CO2: 22 mmol/L (ref 22–32)
Calcium: 8.6 mg/dL — ABNORMAL LOW (ref 8.9–10.3)
Chloride: 92 mmol/L — ABNORMAL LOW (ref 98–111)
Creatinine, Ser: 0.84 mg/dL (ref 0.44–1.00)
GFR, Estimated: >60 mL/min (ref >60)
Glucose, Bld: 132 mg/dL — ABNORMAL HIGH (ref 70–99)
Potassium: 4.2 mmol/L (ref 3.5–5.1)
Sodium: 127 mmol/L — ABNORMAL LOW (ref 135–145)
Total Bilirubin: 0.4 mg/dL (ref 0.3–1.2)
Total Protein: 5.8 g/dL — ABNORMAL LOW (ref 6.5–8.1)

## 2021-08-02 LAB — CBC
HCT: 41.4 % (ref 36.0–46.0)
Hemoglobin: 13.6 g/dL (ref 12.0–15.0)
MCH: 27.3 pg (ref 26.0–34.0)
MCHC: 32.9 g/dL (ref 30.0–36.0)
MCV: 83.1 fL (ref 80.0–100.0)
Platelets: 500 10*3/uL — ABNORMAL HIGH (ref 150–400)
RBC: 4.98 MIL/uL (ref 3.87–5.11)
RDW: 15.9 % — ABNORMAL HIGH (ref 11.5–15.5)
WBC: 9.9 10*3/uL (ref 4.0–10.5)
nRBC: 0 % (ref 0.0–0.2)

## 2021-08-02 LAB — GLUCOSE, CAPILLARY
Glucose-Capillary: 150 mg/dL — ABNORMAL HIGH (ref 70–99)
Glucose-Capillary: 140 mg/dL — ABNORMAL HIGH (ref 70–99)
Glucose-Capillary: 134 mg/dL — ABNORMAL HIGH (ref 70–99)
Glucose-Capillary: 144 mg/dL — ABNORMAL HIGH (ref 70–99)

## 2021-08-02 LAB — MAGNESIUM: Magnesium: 1.9 mg/dL (ref 1.7–2.4)

## 2021-08-02 MED ORDER — IBUPROFEN 200 MG PO TABS
200.0000 mg | ORAL_TABLET | Freq: Four times a day (QID) | ORAL | Status: DC | PRN
  Administered 2021-08-02 – 2021-08-04 (×4): 400 mg via ORAL
  Administered 2021-08-04: 200 mg via ORAL
  Administered 2021-08-04 – 2021-08-05 (×2): 400 mg via ORAL
  Filled 2021-08-02: qty 1
  Filled 2021-08-02 (×6): qty 2

## 2021-08-02 NOTE — Progress Notes (Signed)
PROGRESS NOTE  Amanda Gordon [id number]:[id number] DOB: 03/01/1941 DOA: 07/24/2021 PCP: System, Provider Not In   LOS: 7 days   Brief Narrative / Interim history: Amanda Gordon is a 80 y.o. female with medical history significant of type 2 diabetes, unspecified anemia, hyperlipidemia, hypertension, osteoarthritis of the left knee, type 2 diabetes mellitus, class I obesity presented to the hospital with abdominal distention weight loss with dry history constipation and episode of hematochezia.  CT scan of the abdomen and pelvis showed ascites with enlarged retroperitoneal lymph nodes so patient was admitted hospital for further evaluation and treatment  Subjective / 24h Interval events: Her knee pain is better today.  Complains of abdominal distention, poor appetite, weakness  Assessment & Plan: Principal Problem Abdominal pain nausea with ascites -Had concerns for carcinomatosis and had been seen by oncology.  CEA CA 19-9 and CA125 elevated.  Transvaginal ultrasound showed 4.62 cm right ovarian cyst somewhat complex indeterminate for malignancy.  Discussed with Dr. Irene Limbo, he reviewed cytology and shows malignant cells consistent with malignant ascites, and preliminary immunohistochemical markers suggesting an upper GI source -GI consulted, underwent an EGD on 10/14 which was unrevealing.  An MRI MRCP done 10/14 showed mild wall thickening in the region of the gastric antrum/duodenal bulb, poorly evaluated.  Biopsies from the EGD pending -Repeat ultrasound-guided paracentesis tomorrow, this will be improved with her mobility  Active Problems Borderline prolonged QT interval-Continue telemetry.  Continue to replenish electrolytes as necessary.   Hypokalemia-improved, now normal   Hyponatremia, hypovolemic -sodium slightly lower today, hold further Lasix   Mild protein malnutrition -Likely secondary to underlying malignancy.  Oral nutrition.  Continues to have a poor appetite, continue Megace  which was started yesterday   Hypertension -Continue metoprolol, hold lisinopril due to slight elevation of her creatinine   Hyperlipidemia-Not on treatment.   Type 2 diabetes mellitus  -Continue sliding scale insulin, Accu-Cheks, diabetic diet.  Scheduled Meds:  amLODipine  10 mg Oral Daily   chlorhexidine  15 mL Mouth Rinse BID   feeding supplement  1 Container Oral TID BM   insulin aspart  0-5 Units Subcutaneous QHS   insulin aspart  0-9 Units Subcutaneous TID WC   mouth rinse  15 mL Mouth Rinse q12n4p   megestrol  40 mg Oral BID   metoprolol succinate  100 mg Oral QHS   multivitamin with minerals  1 tablet Oral Daily   pantoprazole  40 mg Oral Daily   polyethylene glycol  17 g Oral Daily   Continuous Infusions: PRN Meds:.acetaminophen **OR** acetaminophen, bisacodyl, ibuprofen, lip balm, ondansetron, prochlorperazine  Diet Orders (From admission, onward)     Start     Ordered   08/01/21 0834  Diet regular Room service appropriate? Yes; Fluid consistency: Thin  Diet effective now       Question Answer Comment  Room service appropriate? Yes   Fluid consistency: Thin      08/01/21 0833            DVT prophylaxis: SCDs Start: 07/24/21 1637     Code Status: Full Code  Family Communication: No family at bedside  Status is: Inpatient  Remains inpatient appropriate because:Inpatient level of care appropriate due to severity of illness  Dispo: The patient is from: Home              Anticipated d/c is to: Home              Patient currently is not medically stable to d/c.  Difficult to place patient No  Level of care: Med-Surg  Consultants:  Oncology   Procedures:  none  Microbiology  none  Antimicrobials: none    Objective: Vitals:   08/01/21 1208 08/01/21 1245 08/01/21 1900 08/02/21 0444  BP: 110/82 118/86 130/84 103/78  Pulse: 98 99 (!) 101 78  Resp:  20 20 18   Temp:  98 F (36.7 C) 97.7 F (36.5 C) 98.1 F (36.7 C)  TempSrc:  Oral Oral  Oral  SpO2:  97% 94% 97%  Weight:      Height:        Intake/Output Summary (Last 24 hours) at 08/02/2021 1031 Last data filed at 08/02/2021 0723 Gross per 24 hour  Intake 960 ml  Output 3 ml  Net 957 ml    Filed Weights   07/24/21 1527 07/25/21 0011 07/31/21 0922  Weight: 79.2 kg 75.1 kg 75.1 kg    Examination:  Constitutional: No distress, in bed Eyes: Anicteric ENMT: Moist mucous membranes Neck: normal, supple Respiratory: Clear bilaterally, no wheezing, no crackles  Cardiovascular: Regular rate and rhythm, no murmurs, no edema Abdomen: Distended, tenderness to palpation, no guarding or rebound, bowel sounds positive Musculoskeletal: no clubbing / cyanosis.  Skin: No rashes seen Neurologic: No focal deficits   Data Reviewed: I have independently reviewed following labs and imaging studies  CBC: Recent Labs  Lab 07/28/21 0328 07/29/21 0337 07/30/21 0321 07/31/21 0319 08/02/21 0317  WBC 9.0 10.4 10.1 9.2 9.9  HGB 13.7 13.5 13.4 12.8 13.6  HCT 42.9 41.7 41.1 39.2 41.4  MCV 85.3 84.6 83.4 83.9 83.1  PLT 410* 441* 448* 397 500*    Basic Metabolic Panel: Recent Labs  Lab 07/29/21 0337 07/30/21 0321 07/31/21 0319 08/01/21 0334 08/02/21 0317  NA 133* 137 129* 129* 127*  K 4.2 4.2 4.2 4.2 4.2  CL 96* 99 93* 92* 92*  CO2 26 27 27 28 22   GLUCOSE 137* 116* 115* 113* 132*  BUN 20 23 29* 29* 27*  CREATININE 0.74 0.94 1.27* 1.04* 0.84  CALCIUM 8.7* 9.2 8.4* 8.5* 8.6*  MG  --   --   --   --  1.9    Liver Function Tests: Recent Labs  Lab 07/27/21 0348 07/28/21 0328 07/29/21 0337 07/30/21 0321 08/02/21 0317  AST 13* 15 17 18 19   ALT 8 8 8 8 10   ALKPHOS 80 82 81 82 79  BILITOT 0.4 0.7 0.5 0.4 0.4  PROT 5.2* 5.4* 5.7* 5.7* 5.8*  ALBUMIN 2.4* 2.4* 2.4* 2.4* 2.5*    Coagulation Profile: No results for input(s): INR, PROTIME in the last 168 hours. HbA1C: No results for input(s): HGBA1C in the last 72 hours. CBG: Recent Labs  Lab 08/01/21 0738  08/01/21 1137 08/01/21 1629 08/01/21 2014 08/02/21 0752  GLUCAP 117* 134* 133* 121* 150*     Recent Results (from the past 240 hour(s))  Surgical pcr screen     Status: None   Collection Time: 07/23/21  2:29 PM   Specimen: Nasal Mucosa; Nasal Swab  Result Value Ref Range Status   MRSA, PCR NEGATIVE NEGATIVE Final   Staphylococcus aureus NEGATIVE NEGATIVE Final    Comment: (NOTE) The Xpert SA Assay (FDA approved for NASAL specimens in patients 32 years of age and older), is one component of a comprehensive surveillance program. It is not intended to diagnose infection nor to guide or monitor treatment. Performed at [facility], [place] [address], [place], [place] [address]   Urine Culture  Status: Abnormal   Collection Time: 07/23/21  2:29 PM   Specimen: Urine, Clean Catch  Result Value Ref Range Status   Specimen Description   Final    URINE, CLEAN CATCH Performed at [facility], [place] [address], [place], [place] [address]    Special Requests   Final    NONE Performed at [facility], [place] [address], [place], [place] [address]    Culture (A)  Final    <10,000 COLONIES/mL INSIGNIFICANT GROWTH Performed at [facility] [address], [place], [place] [address]    Report Status 07/24/2021 FINAL  Final  Resp Panel by RT-PCR (Flu A&B, Covid) Nasopharyngeal Swab     Status: None   Collection Time: 07/24/21  4:16 PM   Specimen: Nasopharyngeal Swab; Nasopharyngeal(NP) swabs in vial transport medium  Result Value Ref Range Status   SARS Coronavirus 2 by RT PCR NEGATIVE NEGATIVE Final    Comment: (NOTE) SARS-CoV-2 target nucleic acids are NOT DETECTED.  The SARS-CoV-2 RNA is generally detectable in upper respiratory specimens during the acute phase of infection. The lowest concentration of SARS-CoV-2 viral copies this assay can detect is 138 copies/mL. A negative result does not preclude SARS-Cov-2 infection  and should not be used as the sole basis for treatment or other patient management decisions. A negative result may occur with  improper specimen collection/handling, submission of specimen other than nasopharyngeal swab, presence of viral mutation(s) within the areas targeted by this assay, and inadequate number of viral copies(<138 copies/mL). A negative result must be combined with clinical observations, patient history, and epidemiological information. The expected result is Negative.  Fact Sheet for Patients:  EntrepreneurPulse.com.au  Fact Sheet for Healthcare Providers:  IncredibleEmployment.be  This test is no t yet approved or cleared by the [place] FDA and  has been authorized for detection and/or diagnosis of SARS-CoV-2 by FDA under an Emergency Use Authorization (EUA). This EUA will remain  in effect (meaning this test can be used) for the duration of the COVID-19 declaration under Section 564(b)(1) of the Act, 21 U.S.C.section 360bbb-3(b)(1), unless the authorization is terminated  or revoked sooner.       Influenza A by PCR NEGATIVE NEGATIVE Final   Influenza B by PCR NEGATIVE NEGATIVE Final    Comment: (NOTE) The Xpert Xpress SARS-CoV-2/FLU/RSV plus assay is intended as an aid in the diagnosis of influenza from Nasopharyngeal swab specimens and should not be used as a sole basis for treatment. Nasal washings and aspirates are unacceptable for Xpert Xpress SARS-CoV-2/FLU/RSV testing.  Fact Sheet for Patients: EntrepreneurPulse.com.au  Fact Sheet for Healthcare Providers: IncredibleEmployment.be  This test is not yet approved or cleared by the [place] FDA and has been authorized for detection and/or diagnosis of SARS-CoV-2 by FDA under an Emergency Use Authorization (EUA). This EUA will remain in effect (meaning this test can be used) for the duration of the COVID-19 declaration  under Section 564(b)(1) of the Act, 21 U.S.C. section 360bbb-3(b)(1), unless the authorization is terminated or revoked.  Performed at [facility], [place] [address] [place], [place] [address]       Radiology Studies: No results found.  Marzetta Board, MD, PhD [facility]  Between 7 am - 7 pm I am available, please contact me via Amion (for emergencies) or Securechat (non urgent messages)  Between 7 pm - 7 am I am not available, please contact night coverage MD/APP via Amion

## 2021-08-02 NOTE — Plan of Care (Signed)

## 2021-08-03 ENCOUNTER — Inpatient Hospital Stay (HOSPITAL_COMMUNITY): Payer: Medicare Other

## 2021-08-03 ENCOUNTER — Encounter (HOSPITAL_COMMUNITY): Payer: Self-pay | Admitting: Gastroenterology

## 2021-08-03 ENCOUNTER — Encounter (HOSPITAL_COMMUNITY): Admission: RE | Payer: Self-pay | Source: Ambulatory Visit

## 2021-08-03 ENCOUNTER — Ambulatory Visit (HOSPITAL_COMMUNITY): Admission: RE | Admit: 2021-08-03 | Payer: Medicare Other | Source: Ambulatory Visit | Admitting: Orthopedic Surgery

## 2021-08-03 DIAGNOSIS — N83201 Unspecified ovarian cyst, right side: Secondary | ICD-10-CM | POA: Diagnosis not present

## 2021-08-03 DIAGNOSIS — C800 Disseminated malignant neoplasm, unspecified: Secondary | ICD-10-CM | POA: Diagnosis not present

## 2021-08-03 DIAGNOSIS — R180 Malignant ascites: Secondary | ICD-10-CM | POA: Diagnosis not present

## 2021-08-03 DIAGNOSIS — E785 Hyperlipidemia, unspecified: Secondary | ICD-10-CM | POA: Diagnosis not present

## 2021-08-03 DIAGNOSIS — M1712 Unilateral primary osteoarthritis, left knee: Secondary | ICD-10-CM | POA: Diagnosis present

## 2021-08-03 LAB — BASIC METABOLIC PANEL
Anion gap: 12 (ref 5–15)
BUN: 24 mg/dL — ABNORMAL HIGH (ref 8–23)
CO2: 22 mmol/L (ref 22–32)
Calcium: 8.6 mg/dL — ABNORMAL LOW (ref 8.9–10.3)
Chloride: 92 mmol/L — ABNORMAL LOW (ref 98–111)
Creatinine, Ser: 0.91 mg/dL (ref 0.44–1.00)
GFR, Estimated: >60 mL/min (ref >60)
Glucose, Bld: 138 mg/dL — ABNORMAL HIGH (ref 70–99)
Potassium: 3.9 mmol/L (ref 3.5–5.1)
Sodium: 126 mmol/L — ABNORMAL LOW (ref 135–145)

## 2021-08-03 LAB — CBC
HCT: 40.4 % (ref 36.0–46.0)
Hemoglobin: 13.4 g/dL (ref 12.0–15.0)
MCH: 27.4 pg (ref 26.0–34.0)
MCHC: 33.2 g/dL (ref 30.0–36.0)
MCV: 82.6 fL (ref 80.0–100.0)
Platelets: 447 10*3/uL — ABNORMAL HIGH (ref 150–400)
RBC: 4.89 MIL/uL (ref 3.87–5.11)
RDW: 15.9 % — ABNORMAL HIGH (ref 11.5–15.5)
WBC: 10.6 10*3/uL — ABNORMAL HIGH (ref 4.0–10.5)
nRBC: 0 % (ref 0.0–0.2)

## 2021-08-03 LAB — GLUCOSE, CAPILLARY
Glucose-Capillary: 120 mg/dL — ABNORMAL HIGH (ref 70–99)
Glucose-Capillary: 145 mg/dL — ABNORMAL HIGH (ref 70–99)
Glucose-Capillary: 112 mg/dL — ABNORMAL HIGH (ref 70–99)
Glucose-Capillary: 152 mg/dL — ABNORMAL HIGH (ref 70–99)

## 2021-08-03 LAB — SURGICAL PATHOLOGY

## 2021-08-03 SURGERY — ARTHROPLASTY, KNEE, TOTAL
Anesthesia: Spinal | Site: Knee | Laterality: Left

## 2021-08-03 MED ORDER — LIDOCAINE HCL 1 % IJ SOLN
INTRAMUSCULAR | Status: AC
  Filled 2021-08-03: qty 20

## 2021-08-03 NOTE — Progress Notes (Signed)
PROGRESS NOTE  Amanda Gordon ZJQ:[id number] DOB: [birth year] DOA: 07/24/2021 PCP: System, Provider Not In   LOS: 8 days   Brief Narrative / Interim history: Amanda Gordon is a 80 y.o. female with medical history significant of type 2 diabetes, unspecified anemia, hyperlipidemia, hypertension, osteoarthritis of the left knee, type 2 diabetes mellitus, class I obesity presented to the hospital with abdominal distention weight loss with dry history constipation and episode of hematochezia.  CT scan of the abdomen and pelvis showed ascites with enlarged retroperitoneal lymph nodes so patient was admitted hospital for further evaluation and treatment  Subjective / 24h Interval events: Knee pain is acceptable today, still has abdominal distention  Assessment & Plan: Principal Problem Abdominal pain nausea with ascites -Had concerns for carcinomatosis and had been seen by oncology.  CEA CA 19-9 and CA125 elevated.  Transvaginal ultrasound showed 4.62 cm right ovarian cyst somewhat complex indeterminate for malignancy.  Discussed with Dr. Irene Limbo, he reviewed cytology and shows malignant cells consistent with malignant ascites, and preliminary immunohistochemical markers suggesting an upper GI source -GI consulted, underwent an EGD on 10/14 which was unrevealing.  An MRI MRCP done 10/14 showed mild wall thickening in the region of the gastric antrum/duodenal bulb, poorly evaluated.  Biopsies from the EGD pending, appreciate GI follow-up -Repeat ultrasound-guided paracentesis today  Active Problems Metastatic cancer, malignant ascites, unknown primary-appreciate oncology follow-up.  Borderline prolonged QT interval-Continue telemetry.  Continue to replenish electrolytes as necessary.   Hypokalemia-improved, now normal   Hyponatremia, hypovolemic -sodium slightly lower today, hold further Lasix as it is unlikely to help   Mild protein malnutrition -Likely secondary to underlying malignancy.  Oral  nutrition.  Continues to have a poor appetite, continue Megace which was started yesterday   Hypertension -Continue metoprolol, hold lisinopril due to slight elevation of her creatinine   Hyperlipidemia-Not on treatment.   Type 2 diabetes mellitus  -Continue sliding scale insulin, Accu-Cheks, diabetic diet.  Scheduled Meds:  amLODipine  10 mg Oral Daily   feeding supplement  1 Container Oral TID BM   insulin aspart  0-5 Units Subcutaneous QHS   insulin aspart  0-9 Units Subcutaneous TID WC   megestrol  40 mg Oral BID   metoprolol succinate  100 mg Oral QHS   multivitamin with minerals  1 tablet Oral Daily   pantoprazole  40 mg Oral Daily   polyethylene glycol  17 g Oral Daily   Continuous Infusions: PRN Meds:.acetaminophen **OR** acetaminophen, bisacodyl, ibuprofen, lip balm, ondansetron, prochlorperazine  Diet Orders (From admission, onward)     Start     Ordered   08/02/21 1940  Diet Carb Modified Fluid consistency: Thin; Room service appropriate? Yes  Diet effective now       Question Answer Comment  Diet-HS Snack? Nothing   Calorie Level Medium 1600-2000   Fluid consistency: Thin   Room service appropriate? Yes      08/02/21 1940            DVT prophylaxis: SCDs Start: 07/24/21 1637     Code Status: Full Code  Family Communication: No family at bedside  Status is: Inpatient  Remains inpatient appropriate because:Inpatient level of care appropriate due to severity of illness  Dispo: The patient is from: Home              Anticipated d/c is to: Home              Patient currently is not medically stable to d/c.  Difficult to place patient No  Level of care: Med-Surg  Consultants:  Oncology   Procedures:  none  Microbiology  none  Antimicrobials: none    Objective: Vitals:   08/03/21 0459 08/03/21 0946 08/03/21 0953 08/03/21 1204  BP: 106/81 115/76 115/76 111/74  Pulse: 70 72 72 77  Resp: 16  (!) 21 14  Temp: 97.7 F (36.5 C) 98.3 F  (36.8 C) 98.3 F (36.8 C) 97.8 F (36.6 C)  TempSrc: Oral Oral Oral Oral  SpO2: 96%  95% 95%  Weight:      Height:        Intake/Output Summary (Last 24 hours) at 08/03/2021 1246 Last data filed at 08/03/2021 0935 Gross per 24 hour  Intake 300 ml  Output --  Net 300 ml    Filed Weights   07/24/21 1527 07/25/21 0011 07/31/21 0922  Weight: 79.2 kg 75.1 kg 75.1 kg    Examination:  Constitutional: NAD, in bed Eyes: No scleral icterus ENMT: mmm Neck: normal, supple Respiratory: Clear bilaterally, no wheezing or crackles heard Cardiovascular: Regular rate and rhythm, no murmurs, no peripheral edema Abdomen: Remains distended, mildly tender to palpation without guarding or rebound Musculoskeletal: no clubbing / cyanosis.  Skin: No rashes seen Neurologic: Nonfocal, equal strength   Data Reviewed: I have independently reviewed following labs and imaging studies  CBC: Recent Labs  Lab 07/29/21 0337 07/30/21 0321 07/31/21 0319 08/02/21 0317 08/03/21 0355  WBC 10.4 10.1 9.2 9.9 10.6*  HGB 13.5 13.4 12.8 13.6 13.4  HCT 41.7 41.1 39.2 41.4 40.4  MCV 84.6 83.4 83.9 83.1 82.6  PLT 441* 448* 397 500* 447*    Basic Metabolic Panel: Recent Labs  Lab 07/30/21 0321 07/31/21 0319 08/01/21 0334 08/02/21 0317 08/03/21 0355  NA 137 129* 129* 127* 126*  K 4.2 4.2 4.2 4.2 3.9  CL 99 93* 92* 92* 92*  CO2 27 27 28 22 22   GLUCOSE 116* 115* 113* 132* 138*  BUN 23 29* 29* 27* 24*  CREATININE 0.94 1.27* 1.04* 0.84 0.91  CALCIUM 9.2 8.4* 8.5* 8.6* 8.6*  MG  --   --   --  1.9  --     Liver Function Tests: Recent Labs  Lab 07/28/21 0328 07/29/21 0337 07/30/21 0321 08/02/21 0317  AST 15 17 18 19   ALT 8 8 8 10   ALKPHOS 82 81 82 79  BILITOT 0.7 0.5 0.4 0.4  PROT 5.4* 5.7* 5.7* 5.8*  ALBUMIN 2.4* 2.4* 2.4* 2.5*    Coagulation Profile: No results for input(s): INR, PROTIME in the last 168 hours. HbA1C: No results for input(s): HGBA1C in the last 72  hours. CBG: Recent Labs  Lab 08/02/21 1122 08/02/21 1612 08/02/21 2042 08/03/21 0726 08/03/21 1114  GLUCAP 140* 134* 144* 120* 145*     Recent Results (from the past 240 hour(s))  Resp Panel by RT-PCR (Flu A&B, Covid) Nasopharyngeal Swab     Status: None   Collection Time: 07/24/21  4:16 PM   Specimen: Nasopharyngeal Swab; Nasopharyngeal(NP) swabs in vial transport medium  Result Value Ref Range Status   SARS Coronavirus 2 by RT PCR NEGATIVE NEGATIVE Final    Comment: (NOTE) SARS-CoV-2 target nucleic acids are NOT DETECTED.  The SARS-CoV-2 RNA is generally detectable in upper respiratory specimens during the acute phase of infection. The lowest concentration of SARS-CoV-2 viral copies this assay can detect is 138 copies/mL. A negative result does not preclude SARS-Cov-2 infection and should not be used as the sole basis for  treatment or other patient management decisions. A negative result may occur with  improper specimen collection/handling, submission of specimen other than nasopharyngeal swab, presence of viral mutation(s) within the areas targeted by this assay, and inadequate number of viral copies(<138 copies/mL). A negative result must be combined with clinical observations, patient history, and epidemiological information. The expected result is Negative.  Fact Sheet for Patients:  EntrepreneurPulse.com.au  Fact Sheet for Healthcare Providers:  IncredibleEmployment.be  This test is no t yet approved or cleared by the [place] FDA and  has been authorized for detection and/or diagnosis of SARS-CoV-2 by FDA under an Emergency Use Authorization (EUA). This EUA will remain  in effect (meaning this test can be used) for the duration of the COVID-19 declaration under Section 564(b)(1) of the Act, 21 U.S.C.section 360bbb-3(b)(1), unless the authorization is terminated  or revoked sooner.       Influenza A by PCR NEGATIVE  NEGATIVE Final   Influenza B by PCR NEGATIVE NEGATIVE Final    Comment: (NOTE) The Xpert Xpress SARS-CoV-2/FLU/RSV plus assay is intended as an aid in the diagnosis of influenza from Nasopharyngeal swab specimens and should not be used as a sole basis for treatment. Nasal washings and aspirates are unacceptable for Xpert Xpress SARS-CoV-2/FLU/RSV testing.  Fact Sheet for Patients: EntrepreneurPulse.com.au  Fact Sheet for Healthcare Providers: IncredibleEmployment.be  This test is not yet approved or cleared by the [place] FDA and has been authorized for detection and/or diagnosis of SARS-CoV-2 by FDA under an Emergency Use Authorization (EUA). This EUA will remain in effect (meaning this test can be used) for the duration of the COVID-19 declaration under Section 564(b)(1) of the Act, 21 U.S.C. section 360bbb-3(b)(1), unless the authorization is terminated or revoked.  Performed at [facility], [place] [address] [place], [place] [address]       Radiology Studies: No results found.  Marzetta Board, MD, PhD [facility]  Between 7 am - 7 pm I am available, please contact me via Amion (for emergencies) or Securechat (non urgent messages)  Between 7 pm - 7 am I am not available, please contact night coverage MD/APP via Amion

## 2021-08-03 NOTE — Care Management Important Message (Signed)
Important Message  Patient Details IM Letter given to the Patient. Name: Teiana Hajduk Smarr MRN: [id number] Date of Birth: 29-Mar-1941   Medicare Important Message Given:  Yes     Kerin Salen 08/03/2021, 1:24 PM

## 2021-08-03 NOTE — Plan of Care (Signed)
  Problem: Education: Goal: Knowledge of General Education information will improve Description: Including pain rating scale, medication(s)/side effects and non-pharmacologic comfort measures Outcome: Progressing   Problem: Clinical Measurements: Goal: Respiratory complications will improve Outcome: Progressing   Problem: Coping: Goal: Level of anxiety will decrease Outcome: Progressing   

## 2021-08-03 NOTE — Progress Notes (Signed)
PROCEDURE SUMMARY:  Successful US guided therapeutic paracentesis from left lower quadrant Yielded 6 L of clear, yellow fluid.  No immediate complications.  Pt tolerated well.   Specimen not sent for labs.  EBL < 29mL  Tyson Alias, NP 08/03/2021 2:55 PM

## 2021-08-03 NOTE — Progress Notes (Signed)
Physical Therapy Treatment Patient Details Name: Amanda Gordon MRN: [id number] DOB: 1941/07/04 Today's Date: 08/03/2021   History of Present Illness 80 y.o. female with medical history significant of type 2 diabetes, unspecified anemia, hyperlipidemia, hypertension, osteoarthritis of the left knee, type 2 diabetes mellitus, class I obesity presented to the hospital with abdominal distention weight loss with dry history constipation  and episode of hematochezia.  CT scan of the abdomen and pelvis showed ascites with enlarged retroperitoneal lymph nodes    PT Comments    Pt continues to report nausea despite IV meds however agreeable to mobilize. Pt assisted with ambulating in hallway and min/guard-supervision at this time.     Recommendations for follow up therapy are one component of a multi-disciplinary discharge planning process, led by the attending physician.  Recommendations may be updated based on patient status, additional functional criteria and insurance authorization.  Follow Up Recommendations  No PT follow up     Equipment Recommendations  Rolling walker with 5" wheels    Recommendations for Other Services       Precautions / Restrictions Precautions Precautions: None     Mobility  Bed Mobility Overal bed mobility: Needs Assistance Bed Mobility: Supine to Sit;Sit to Supine     Supine to sit: Supervision;HOB elevated Sit to supine: Supervision;HOB elevated   General bed mobility comments: increased time and effort    Transfers Overall transfer level: Needs assistance Equipment used: Rolling walker (2 wheeled) Transfers: Sit to/from Stand Sit to Stand: Supervision;Min guard            Ambulation/Gait Ambulation/Gait assistance: Min Gaffer (Feet): 240 Feet Assistive device: Rolling walker (2 wheeled) Gait Pattern/deviations: Step-through pattern;Decreased stride length     General Gait Details: pt premedicated by IV for  nausea however still c/o nausea; steady with RW, took 2 seated rest breaks due to nausea   Stairs             Wheelchair Mobility    Modified Rankin (Stroke Patients Only)       Balance                                            Cognition Arousal/Alertness: Awake/alert Behavior During Therapy: WFL for tasks assessed/performed Overall Cognitive Status: Within Functional Limits for tasks assessed                                        Exercises      General Comments        Pertinent Vitals/Pain Pain Assessment: Faces Faces Pain Scale: Hurts little more Pain Location: abdomen and L knee Pain Descriptors / Indicators: Sore Pain Intervention(s): Monitored during session;Repositioned    Home Living                      Prior Function            PT Goals (current goals can now be found in the care plan section) Progress towards PT goals: Progressing toward goals    Frequency    Min 3X/week      PT Plan Current plan remains appropriate    Co-evaluation              AM-PAC PT "6 Clicks" Mobility   Outcome  Measure  Help needed turning from your back to your side while in a flat bed without using bedrails?: A Little Help needed moving from lying on your back to sitting on the side of a flat bed without using bedrails?: A Little Help needed moving to and from a bed to a chair (including a wheelchair)?: A Little Help needed standing up from a chair using your arms (e.g., wheelchair or bedside chair)?: A Little Help needed to walk in hospital room?: A Little Help needed climbing 3-5 steps with a railing? : A Little 6 Click Score: 18    End of Session   Activity Tolerance: Patient tolerated treatment well Patient left: in bed;with call bell/phone within reach   PT Visit Diagnosis: Difficulty in walking, not elsewhere classified (R26.2)     Time: 1859-0931 PT Time Calculation (min) (ACUTE ONLY): 19  min  Charges:  $Gait Training: 8-22 mins                    Arlyce Dice, DPT Acute Rehabilitation Services Pager: [phone] Office: [place] 08/03/2021, 1:26 PM

## 2021-08-03 NOTE — Progress Notes (Signed)
[facility] Gastroenterology Progress Note  Amanda Gordon Chrystal 80 y.o. 02/14/1941  CC:  Malignant Ascites   Subjective: Patient states she is doing okay this morning. Has some abdominal pain. Denies nausea/vomiting. Has been eating some and has been able to tolerate things like potato soup in small portions. Has had a BM every day, though did not have one yesterday.   ROS : Review of Systems  Cardiovascular:  Negative for chest pain and palpitations.  Gastrointestinal:  Positive for abdominal pain. Negative for blood in stool, constipation, diarrhea, heartburn, melena, nausea and vomiting.     Objective: Vital signs in last 24 hours: Vitals:   08/02/21 2045 08/03/21 0459  BP: 100/72 106/81  Pulse: 77 70  Resp: 18 16  Temp: 97.7 F (36.5 C) 97.7 F (36.5 C)  SpO2: 95% 96%    Physical Exam:  General:  Alert, cooperative, no distress  Head:  Normocephalic, without obvious abnormality, atraumatic  Eyes:  Anicteric sclera, EOM's intact  Lungs:   Clear to auscultation bilaterally, respirations unlabored  Heart:  Regular rate and rhythm, S1, S2 normal  Abdomen:   Distended, mild tenderness to palpation with no rebound tenderness. Bowel sounds are present.     Lab Results: Recent Labs    08/02/21 0317 08/03/21 0355  NA 127* 126*  K 4.2 3.9  CL 92* 92*  CO2 22 22  GLUCOSE 132* 138*  BUN 27* 24*  CREATININE 0.84 0.91  CALCIUM 8.6* 8.6*  MG 1.9  --    Recent Labs    08/02/21 0317  AST 19  ALT 10  ALKPHOS 79  BILITOT 0.4  PROT 5.8*  ALBUMIN 2.5*   Recent Labs    08/02/21 0317 08/03/21 0355  WBC 9.9 10.6*  HGB 13.6 13.4  HCT 41.4 40.4  MCV 83.1 82.6  PLT 500* 447*   No results for input(s): LABPROT, INR in the last 72 hours.    Assessment Malignant ascites - Elevated CEA, CA 19-9, and CA 125.  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 - EGD 10/14: showed gastritis and abnormal mucoas in the duodenal bulb, biopsies pending. -  MRCP: similar thickening in the gastric antrum more in the duodenal bulb, otherwise normal appearing pancreas.  DM Type II   Plan: Biopsies from EGD still pending, will follow up on biopsy results.  Patient is scheduled for paracentesis today.  Continue supportive care.  [facility] will continue to follow  Garnette Scheuermann PA-C 08/03/2021, 8:30 AM  Contact #  [phone]

## 2021-08-04 DIAGNOSIS — N83201 Unspecified ovarian cyst, right side: Secondary | ICD-10-CM | POA: Diagnosis not present

## 2021-08-04 DIAGNOSIS — R180 Malignant ascites: Secondary | ICD-10-CM | POA: Diagnosis not present

## 2021-08-04 DIAGNOSIS — E785 Hyperlipidemia, unspecified: Secondary | ICD-10-CM | POA: Diagnosis not present

## 2021-08-04 DIAGNOSIS — C800 Disseminated malignant neoplasm, unspecified: Secondary | ICD-10-CM | POA: Diagnosis not present

## 2021-08-04 LAB — BASIC METABOLIC PANEL
Anion gap: 12 (ref 5–15)
BUN: 23 mg/dL (ref 8–23)
CO2: 22 mmol/L (ref 22–32)
Calcium: 8.3 mg/dL — ABNORMAL LOW (ref 8.9–10.3)
Chloride: 94 mmol/L — ABNORMAL LOW (ref 98–111)
Creatinine, Ser: 0.81 mg/dL (ref 0.44–1.00)
GFR, Estimated: >60 mL/min (ref >60)
Glucose, Bld: 141 mg/dL — ABNORMAL HIGH (ref 70–99)
Potassium: 3.9 mmol/L (ref 3.5–5.1)
Sodium: 128 mmol/L — ABNORMAL LOW (ref 135–145)

## 2021-08-04 LAB — GLUCOSE, CAPILLARY
Glucose-Capillary: 168 mg/dL — ABNORMAL HIGH (ref 70–99)
Glucose-Capillary: 152 mg/dL — ABNORMAL HIGH (ref 70–99)
Glucose-Capillary: 135 mg/dL — ABNORMAL HIGH (ref 70–99)
Glucose-Capillary: 120 mg/dL — ABNORMAL HIGH (ref 70–99)

## 2021-08-04 LAB — CBC
HCT: 41.6 % (ref 36.0–46.0)
Hemoglobin: 13.5 g/dL (ref 12.0–15.0)
MCH: 27.2 pg (ref 26.0–34.0)
MCHC: 32.5 g/dL (ref 30.0–36.0)
MCV: 83.9 fL (ref 80.0–100.0)
Platelets: 489 10*3/uL — ABNORMAL HIGH (ref 150–400)
RBC: 4.96 MIL/uL (ref 3.87–5.11)
RDW: 15.9 % — ABNORMAL HIGH (ref 11.5–15.5)
WBC: 13.4 10*3/uL — ABNORMAL HIGH (ref 4.0–10.5)
nRBC: 0 % (ref 0.0–0.2)

## 2021-08-04 MED ORDER — ENSURE ENLIVE PO LIQD
237.0000 mL | Freq: Two times a day (BID) | ORAL | Status: DC
  Administered 2021-08-04: 237 mL via ORAL

## 2021-08-04 NOTE — Progress Notes (Signed)
[facility] Gastroenterology Progress Note  Amanda Gordon 80 y.o. 1941/06/14  CC:  Malignant Ascites   Subjective: Patient states she is doing okay this morning. Better when she is sleeping. Patient was very somnolent since the encounter woke her up from sleeping. Denies nausea/vomiting/pain  ROS : Review of Systems  Cardiovascular:  Negative for chest pain and palpitations.  Gastrointestinal:  Negative for abdominal pain, blood in stool, constipation, diarrhea, heartburn, melena, nausea and vomiting.     Objective: Vital signs in last 24 hours: Vitals:   08/03/21 2125 08/04/21 0500  BP: 102/75 97/69  Pulse: 83 78  Resp: 17 17  Temp: 97.6 F (36.4 C) 97.7 F (36.5 C)  SpO2: 96% 96%    Physical Exam:  General:  Alert, cooperative, no distress, appears stated age  Head:  Normocephalic, without obvious abnormality, atraumatic  Eyes:  Anicteric sclera, EOM's intact  Lungs:   Clear to auscultation bilaterally, respirations unlabored  Heart:  Regular rate and rhythm, S1, S2 normal  Abdomen:   Mild distention, improved from yesterday. Nontender. Bowel sounds present    Lab Results: Recent Labs    08/02/21 0317 08/03/21 0355 08/04/21 0333  NA 127* 126* 128*  K 4.2 3.9 3.9  CL 92* 92* 94*  CO2 22 22 22   GLUCOSE 132* 138* 141*  BUN 27* 24* 23  CREATININE 0.84 0.91 0.81  CALCIUM 8.6* 8.6* 8.3*  MG 1.9  --   --    Recent Labs    08/02/21 0317  AST 19  ALT 10  ALKPHOS 79  BILITOT 0.4  PROT 5.8*  ALBUMIN 2.5*   Recent Labs    08/03/21 0355 08/04/21 0333  WBC 10.6* 13.4*  HGB 13.4 13.5  HCT 40.4 41.6  MCV 82.6 83.9  PLT 447* 489*   No results for input(s): LABPROT, INR in the last 72 hours.    Assessment Malignant ascites - Elevated CEA, CA 19-9, and CA 125.  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 - EGD 10/14: showed gastritis and abnormal mucoas in the duodenal bulb, biopsies negative. - MRCP: similar thickening in  the gastric antrum more in the duodenal bulb, otherwise normal appearing pancreas.   DM Type II   Plan: EGD biopsies negative.  Recommend outpatient PET scan  [facility] will sign off. Please contact us if we can be of any further assistance during this hospital stay.   Shavelle Runkel Radford Pax PA-C 08/04/2021, 10:35 AM  Contact #  [phone]

## 2021-08-04 NOTE — Progress Notes (Signed)
PROGRESS NOTE  Amanda Gordon YQI:[id number] DOB: 1940/10/25 DOA: 07/24/2021 PCP: System, Provider Not In   LOS: 9 days   Brief Narrative / Interim history: Amanda Gordon is a 80 y.o. female with medical history significant of type 2 diabetes, unspecified anemia, hyperlipidemia, hypertension, osteoarthritis of the left knee, type 2 diabetes mellitus, class I obesity presented to the hospital with abdominal distention weight loss with dry history constipation and episode of hematochezia.  CT scan of the abdomen and pelvis showed ascites with enlarged retroperitoneal lymph nodes so patient was admitted hospital for further evaluation and treatment  Subjective / 24h Interval events: She felt well after paracentesis yesterday, more uncomfortable today and feels like fluid is starting to come back  Assessment & Plan: Principal Problem Abdominal pain nausea with ascites -Had concerns for carcinomatosis and had been seen by oncology.  CEA CA 19-9 and CA125 elevated.  Transvaginal ultrasound showed 4.62 cm right ovarian cyst somewhat complex indeterminate for malignancy.  Discussed with Dr. Irene Limbo, he reviewed cytology and shows malignant cells consistent with malignant ascites, and preliminary immunohistochemical markers suggesting an upper GI source -GI consulted, underwent an EGD on 10/14 which was unrevealing.  An MRI MRCP done 10/14 showed mild wall thickening in the region of the gastric antrum/duodenal bulb, poorly evaluated.  Biopsies from the EGD were negative -She underwent ultrasound-guided paracentesis on 10/10 and again a week apart on 10/17  Active Problems Metastatic cancer, malignant ascites, unknown primary-appreciate oncology follow-up.  All work-up was negative and there is no primary malignancy identified except for the malignant cells in the ascitic fluid.  Neck step would be an outpatient PET scan  Borderline prolonged QT interval-Continue telemetry.  Continue to replenish  electrolytes as necessary.   Hypokalemia-improved, now normal   Hyponatremia, hypovolemic -sodium still low at 128, gradually improving, if stable tomorrow should be safe for discharge   Mild protein malnutrition -Likely secondary to underlying malignancy.  Oral nutrition.  Continues to have a poor appetite, continue Megace, appetite seems to be improving a little bit   Hypertension -continue metoprolol, likely hold lisinopril on discharge due to soft blood pressures   Hyperlipidemia-Not on treatment.   Type 2 diabetes mellitus  -Continue sliding scale insulin, Accu-Cheks, diabetic diet.  Scheduled Meds:  amLODipine  10 mg Oral Daily   feeding supplement  237 mL Oral BID BM   insulin aspart  0-5 Units Subcutaneous QHS   insulin aspart  0-9 Units Subcutaneous TID WC   megestrol  40 mg Oral BID   metoprolol succinate  100 mg Oral QHS   multivitamin with minerals  1 tablet Oral Daily   pantoprazole  40 mg Oral Daily   polyethylene glycol  17 g Oral Daily   Continuous Infusions: PRN Meds:.acetaminophen **OR** acetaminophen, bisacodyl, ibuprofen, lip balm, ondansetron, prochlorperazine  Diet Orders (From admission, onward)     Start     Ordered   08/02/21 1940  Diet Carb Modified Fluid consistency: Thin; Room service appropriate? Yes  Diet effective now       Question Answer Comment  Diet-HS Snack? Nothing   Calorie Level Medium 1600-2000   Fluid consistency: Thin   Room service appropriate? Yes      08/02/21 1940            DVT prophylaxis: SCDs Start: 07/24/21 1637     Code Status: Full Code  Family Communication: Daughter present at bedside  Status is: Inpatient  Remains inpatient appropriate because:Inpatient level of care appropriate  due to severity of illness  Dispo: The patient is from: Home              Anticipated d/c is to: Home              Patient currently is not medically stable to d/c.  Anticipate discharge 10/19   Difficult to place patient  No  Level of care: Med-Surg  Consultants:  Oncology   Procedures:  none  Microbiology  none  Antimicrobials: none    Objective: Vitals:   08/03/21 1445 08/03/21 1604 08/03/21 2125 08/04/21 0500  BP: 104/68 97/64 102/75 97/69  Pulse:  74 83 78  Resp:  16 17 17   Temp:  97.6 F (36.4 C) 97.6 F (36.4 C) 97.7 F (36.5 C)  TempSrc:  Oral Oral Oral  SpO2:  98% 96% 96%  Weight:      Height:        Intake/Output Summary (Last 24 hours) at 08/04/2021 1118 Last data filed at 08/04/2021 0300 Gross per 24 hour  Intake 960 ml  Output --  Net 960 ml    Filed Weights   07/24/21 1527 07/25/21 0011 07/31/21 0922  Weight: 79.2 kg 75.1 kg 75.1 kg    Examination:  Constitutional: No distress, in bed Eyes: Anicteric ENMT: Moist membranes Neck: normal, supple Respiratory: CTA Cardiovascular: RRR Abdomen: Appears mildly distended Musculoskeletal: no clubbing / cyanosis.  Skin: No new rashes Neurologic: No apparent focal deficits   Data Reviewed: I have independently reviewed following labs and imaging studies  CBC: Recent Labs  Lab 07/30/21 0321 07/31/21 0319 08/02/21 0317 08/03/21 0355 08/04/21 0333  WBC 10.1 9.2 9.9 10.6* 13.4*  HGB 13.4 12.8 13.6 13.4 13.5  HCT 41.1 39.2 41.4 40.4 41.6  MCV 83.4 83.9 83.1 82.6 83.9  PLT 448* 397 500* 447* 489*    Basic Metabolic Panel: Recent Labs  Lab 07/31/21 0319 08/01/21 0334 08/02/21 0317 08/03/21 0355 08/04/21 0333  NA 129* 129* 127* 126* 128*  K 4.2 4.2 4.2 3.9 3.9  CL 93* 92* 92* 92* 94*  CO2 27 28 22 22 22   GLUCOSE 115* 113* 132* 138* 141*  BUN 29* 29* 27* 24* 23  CREATININE 1.27* 1.04* 0.84 0.91 0.81  CALCIUM 8.4* 8.5* 8.6* 8.6* 8.3*  MG  --   --  1.9  --   --     Liver Function Tests: Recent Labs  Lab 07/29/21 0337 07/30/21 0321 08/02/21 0317  AST 17 18 19   ALT 8 8 10   ALKPHOS 81 82 79  BILITOT 0.5 0.4 0.4  PROT 5.7* 5.7* 5.8*  ALBUMIN 2.4* 2.4* 2.5*    Coagulation Profile: No  results for input(s): INR, PROTIME in the last 168 hours. HbA1C: No results for input(s): HGBA1C in the last 72 hours. CBG: Recent Labs  Lab 08/03/21 0726 08/03/21 1114 08/03/21 1600 08/03/21 2128 08/04/21 0733  GLUCAP 120* 145* 112* 152* 168*     No results found for this or any previous visit (from the past 240 hour(s)).     Radiology Studies: US Paracentesis  Result Date: 08/03/2021 INDICATION: History of metastatic cancer with malignant ascites. Request for therapeutic paracentesis EXAM: ULTRASOUND GUIDED THERAPEUTIC PARACENTESIS MEDICATIONS: 10 mL 1% lidocaine COMPLICATIONS: None immediate. PROCEDURE: Informed written consent was obtained from the patient after a discussion of the risks, benefits and alternatives to treatment. A timeout was performed prior to the initiation of the procedure. Initial ultrasound scanning demonstrates a large amount of ascites within the left lower  abdominal quadrant. The left lower abdomen was prepped and draped in the usual sterile fashion. 1% lidocaine was used for local anesthesia. Following this, a 6 Fr Safe-T-Centesis catheter was introduced. An ultrasound image was saved for documentation purposes. The paracentesis was performed. The catheter was removed and a dressing was applied. The patient tolerated the procedure well without immediate post procedural complication. FINDINGS: A total of approximately 6 L of clear, yellow fluid was removed. IMPRESSION: Successful ultrasound-guided paracentesis yielding 6 liters of peritoneal fluid. Read by: Narda Rutherford, NP Electronically Signed   By: Michaelle Birks M.D.   On: 08/03/2021 15:35    Marzetta Board, MD, PhD [facility]  Between 7 am - 7 pm I am available, please contact me via Amion (for emergencies) or Securechat (non urgent messages)  Between 7 pm - 7 am I am not available, please contact night coverage MD/APP via Amion

## 2021-08-04 NOTE — TOC Progression Note (Addendum)
Transition of Care [facility]) - Progression Note    Patient Details  Name: Amanda Gordon MRN: [id number] Date of Birth: 1941-05-31  Transition of [facility]) CM/SW Contact  Leeroy Cha, RN Phone Number: 08/04/2021, 9:12 AM  Clinical Narrative:    80 y.o. female with medical history significant of type 2 diabetes, unspecified anemia, hyperlipidemia, hypertension, osteoarthritis of the left knee, type 2 diabetes mellitus, class I obesity presented to the hospital with abdominal distention weight loss with dry history constipation and episode of hematochezia.  CT scan of the abdomen and pelvis showed ascites with enlarged retroperitoneal lymph nodes so patient was admitted hospital for further evaluation and treatment   Subjective / 24h Interval events: Knee pain is acceptable today, still has abdominal distention   Assessment & Plan: Principal Problem Abdominal pain nausea with ascites -Had concerns for carcinomatosis and had been seen by oncology.  CEA CA 19-9 and CA125 elevated.  Transvaginal ultrasound showed 4.62 cm right ovarian cyst somewhat complex indeterminate for malignancy.  Discussed with Dr. Irene Limbo, he reviewed cytology and shows malignant cells consistent with malignant ascites, and preliminary immunohistochemical markers suggesting an upper GI source -GI consulted, underwent an EGD on 10/14 which was unrevealing.  An MRI MRCP done 10/14 showed mild wall thickening in the region of the gastric antrum/duodenal bulb, poorly evaluated.  Biopsies from the EGD pending, appreciate GI follow-up -Repeat ultrasound-guided paracentesis today  Toc plan of care: Paracentesis performed on 101722, following for progression of care and toc needs, should be able to return to home with self care. Plan for home p.t. sent to [facility] for review.  Barriers to Discharge: Continued Medical Work up  Expected Discharge Plan and Services                                                  Social Determinants of Health (SDOH) Interventions    Readmission Risk Interventions No flowsheet data found.

## 2021-08-04 NOTE — TOC Transition Note (Signed)
Transition of Care [facility]) - CM/SW Discharge Note   Patient Details  Name: Amanda Gordon MRN: [id number] Date of Birth: 14-Apr-1941  Transition of Care [facility]) CM/SW Contact:  Leeroy Cha, RN Phone Number: 08/04/2021, 2:53 PM   Clinical Narrative:    Hhc will be done through [place].   Final next level of care: [place] Barriers to Discharge: Continued Medical Work up   Patient Goals and CMS Choice Patient states their goals for this hospitalization and ongoing recovery are:: toi go home CMS Medicare.gov Compare Post Acute Care list provided to:: Patient Choice offered to / list presented to : Patient  Discharge Placement                       Discharge Plan and Services   Discharge Planning Services: CM Consult Post Acute Care Choice: Home Health                    HH Arranged: PT [place]: [place] Date [place]: 08/04/21 Time [place]: 5465 Representative spoke with at [place]: corry  Social Determinants of Health ([place]) Interventions     Readmission Risk Interventions No flowsheet data found.

## 2021-08-04 NOTE — Progress Notes (Signed)
Nutrition Follow-up  INTERVENTION:   -Ensure Plus PO BID, each provides 350 kcals and 13g protein  -D/c Boost Breeze   -Multivitamin with minerals daily  NUTRITION DIAGNOSIS:   Increased nutrient needs related to chronic illness as evidenced by estimated needs.  Ongoing.  GOAL:   Patient will meet greater than or equal to 90% of their needs  Progressing.  MONITOR:   PO intake, Supplement acceptance, Weight trends, Labs, I & O's  REASON FOR ASSESSMENT:   Consult Other (Comment)  ASSESSMENT:   80 y.o. female with medical history significant of type 2 diabetes, unspecified anemia, hyperlipidemia, hypertension, osteoarthritis of the left knee, type 2 diabetes mellitus, class I obesity who is coming to the emergency department due to abdominal distention after first losing about 22 pounds in the month and then regaining 17 pounds with profound abdominal distention.  10/14: EGD -unrevealing 10/17: s/p paracentesis, 6L removed  Patient currently drinking supplements, prefers Ensure so will d/c Boost Breeze. Consuming 15-90% of meals.   Per MD note, possible d/c 10/19.  Admission weight: 174 lbs.  Last recorded weight 10/8: 165 lbs.  Medications: Megace, Multivitamin with minerals daily, Miralax, Compazine  Labs reviewed:  CBGs: 112-168 Low Na  Diet Order:   Diet Order             Diet Carb Modified Fluid consistency: Thin; Room service appropriate? Yes  Diet effective now                   EDUCATION NEEDS:   No education needs have been identified at this time  Skin:  Skin Assessment: Skin Integrity Issues: Skin Integrity Issues:: Incisions Incisions: 10/8 abdomen  Last BM:  10/18 -type 4  Height:   Ht Readings from Last 1 Encounters:  08/04/21 5\' 2"  (1.575 m)    Weight:   Wt Readings from Last 1 Encounters:  08/04/21 75.1 kg    BMI:  Body mass index is 30.28 kg/m.  Estimated Nutritional Needs:   Kcal:  1500-1700  Protein:   65-80g  Fluid:  1.7L/day  Clayton Bibles, MS, RD, LDN Inpatient Clinical Dietitian Contact information available via Amion

## 2021-08-04 NOTE — Plan of Care (Signed)

## 2021-08-04 NOTE — Progress Notes (Addendum)
[facility]  HEMATOLOGY/ONCOLOGY INPATIENT PROGRESS NOTE  Date of Service: 08/04/2021  Inpatient Attending: [name], MD  SUBJECTIVE: The patient had a repeat paracentesis performed 08/04/2021 with 6 L of fluid removed.  She reported some improvement in her abdominal distention following the paracentesis but now reports that she feels that she is feeling up again pretty quickly.  She reports generalized weakness today.  She is not having any chest pain or shortness of breath.  She has no nausea or vomiting.  REVIEW OF SYSTEMS:   A 10 point review of systems is otherwise negative.  PHYSICAL EXAMINATION: . Vitals:   08/03/21 1445 08/03/21 1604 08/03/21 2125 08/04/21 0500  BP: 104/68 97/64 102/75 97/69  Pulse:  74 83 78  Resp:  16 17 17   Temp:  97.6 F (36.4 C) 97.6 F (36.4 C) 97.7 F (36.5 C)  TempSrc:  Oral Oral Oral  SpO2:  98% 96% 96%  Weight:      Height:       Filed Weights   07/24/21 1527 07/25/21 0011 07/31/21 0922  Weight: 79.2 kg 75.1 kg 75.1 kg   .Body mass index is 30.28 kg/m.  GENERAL:alert, in no acute distress and comfortable BREASTS: No palpable masses or axillary lymphadenopathy LUNGS: clear to auscultation with normal respiratory effort HEART: regular rate & rhythm,  no murmurs and no lower extremity edema ABDOMEN: abdomen distended, tympanic, upper to mid abdominal discomfort on palpation . PSYCH: alert & oriented x 3 with fluent speech NEURO: no focal motor/sensory deficits  MEDICAL HISTORY:  Past Medical History:  Diagnosis Date   Allergy    Anemia    Complication of anesthesia    Hyperlipidemia    Hypertension    PONV (postoperative nausea and vomiting)    Primary localized osteoarthritis of left knee 07/21/2021   Type 2 diabetes mellitus ([place])     SURGICAL HISTORY: Past Surgical History:  Procedure Laterality Date   ABDOMINAL HYSTERECTOMY     APPENDECTOMY     BIOPSY  07/31/2021   Procedure: BIOPSY;  Surgeon: Otis Brace, MD;   Location: [facility];  Service: Gastroenterology;;   CHOLECYSTECTOMY     ESOPHAGOGASTRODUODENOSCOPY (EGD) WITH PROPOFOL N/A 07/31/2021   Procedure: ESOPHAGOGASTRODUODENOSCOPY (EGD) WITH PROPOFOL;  Surgeon: Otis Brace, MD;  Location: [facility];  Service: Gastroenterology;  Laterality: N/A;    SOCIAL HISTORY: Social History   Socioeconomic History   Marital status: Married    Spouse name: Not on file   Number of children: Not on file   Years of education: Not on file   Highest education level: Not on file  Occupational History   Not on file  Tobacco Use   Smoking status: Never   Smokeless tobacco: Never  Vaping Use   Vaping Use: Never used  Substance and Sexual Activity   Alcohol use: No   Drug use: No   Sexual activity: Not on file  Other Topics Concern   Not on file  Social History Narrative   Not on file   Social Determinants of Health   Financial Resource Strain: Not on file  Food Insecurity: Not on file  Transportation Needs: Not on file  Physical Activity: Not on file  Stress: Not on file  Social Connections: Not on file  Intimate Partner Violence: Not on file    FAMILY HISTORY: Family History  Problem Relation Age of Onset   Heart disease Father    Hypertension Father    Stroke Father    Diabetes Brother  Heart disease Paternal Grandmother    Stroke Paternal Grandfather     ALLERGIES:  is allergic to codeine.  MEDICATIONS:  Scheduled Meds:  amLODipine  10 mg Oral Daily   feeding supplement  237 mL Oral BID BM   insulin aspart  0-5 Units Subcutaneous QHS   insulin aspart  0-9 Units Subcutaneous TID WC   megestrol  40 mg Oral BID   metoprolol succinate  100 mg Oral QHS   multivitamin with minerals  1 tablet Oral Daily   pantoprazole  40 mg Oral Daily   polyethylene glycol  17 g Oral Daily   Continuous Infusions: PRN Meds:.acetaminophen **OR** acetaminophen, bisacodyl, ibuprofen, lip balm, ondansetron, prochlorperazine  REVIEW OF  SYSTEMS:    10 Point review of Systems was done is negative except as noted above.   LABORATORY DATA:  I have reviewed the data as listed  . CBC Latest Ref Rng & Units 07/30/2021 07/29/2021  WBC 4.0 - 10.5 K/uL 10.1 10.4  Hemoglobin 12.0 - 15.0 g/dL 13.4 13.5  Hematocrit 36.0 - 46.0 % 41.1 41.7  Platelets 150 - 400 K/uL 448(H) 441(H)    . CMP Latest Ref Rng & Units 07/30/2021 07/29/2021  Glucose 70 - 99 mg/dL 116(H) 137(H)  BUN 8 - 23 mg/dL 23 20  Creatinine 0.44 - 1.00 mg/dL 0.94 0.74  Sodium 135 - 145 mmol/L 137 133(L)  Potassium 3.5 - 5.1 mmol/L 4.2 4.2  Chloride 98 - 111 mmol/L 99 96(L)  CO2 22 - 32 mmol/L 27 26  Calcium 8.9 - 10.3 mg/dL 9.2 8.7(L)  Total Protein 6.5 - 8.1 g/dL 5.7(L) 5.7(L)  Total Bilirubin 0.3 - 1.2 mg/dL 0.4 0.5  Alkaline Phos 38 - 126 U/L 82 81  AST 15 - 41 U/L 18 17  ALT 0 - 44 U/L 8 8   Component     Latest Ref Rng & Units 07/26/2021  CEA     0.0 - 4.7 ng/mL 32.7 (H)  CA 19-9     0 - 35 U/mL 2,040 (H)  Cancer Antigen (CA) 125     0.0 - 38.1 U/mL 124.0 (H)    CYTOLOGY - NON PAP  CASE: [id number]  PATIENT: Amanda Gordon  Non-Gynecological Cytology Report      Clinical History: Rt ovarian cyst  Specimen Submitted:  A. ASCITES, PARACENTESIS:    FINAL MICROSCOPIC DIAGNOSIS:  - Malignant cells consistent with adenocarcinoma  - CK7 positivity, patchy CK20 positivity, and CDX2 positivity suggest  the possibility of an upper GI primary.  PAX8, WT1, S100, and Melan A  stains are negative.    RADIOGRAPHIC STUDIES: I have personally reviewed the radiological images as listed and agreed with the findings in the report.  CT CHEST W CONTRAST  Result Date: 07/29/2021 CLINICAL DATA:  Cancer of unknown primary. Staging CT scan. EXAM: CT CHEST WITH CONTRAST TECHNIQUE: Multidetector CT imaging of the chest was performed during intravenous contrast administration. CONTRAST:  58mL OMNIPAQUE IOHEXOL 350 MG/ML SOLN COMPARISON:  CT abdomen/pelvis  07/24/2021 FINDINGS: Cardiovascular: The heart is normal in size. No pericardial effusion. The aorta is normal in caliber. No dissection. Scattered atherosclerotic calcifications. The branch vessels are patent. Scattered coronary artery calcifications. Mediastinum/Nodes: No mediastinal or hilar mass or lymphadenopathy. Small scattered lymph nodes are noted. The esophagus is unremarkable. Thyroid gland is unremarkable. Lungs/Pleura: Bilateral pleural effusions, moderate on the right and mild on the left. No enhancing pleural nodules are identified. No pulmonary nodules or pulmonary lesions. Upper Abdomen: Upper abdominal ascites  again demonstrated. Calcified granulomas are noted in the spleen. Musculoskeletal: No breast masses, supraclavicular or axillary adenopathy. The bony thorax is intact. Benign T8 hemangioma noted. IMPRESSION: 1. Bilateral pleural effusions, moderate on the right and mild on the left. 2. No mediastinal or hilar mass or adenopathy. 3. No pulmonary nodules or pulmonary lesions. 4. Upper abdominal ascites. Electronically Signed   By: Marijo Sanes M.D.   On: 07/29/2021 12:28   US PELVIS TRANSVAGINAL NON-OB (TV ONLY)  Result Date: 07/26/2021 CLINICAL DATA:  80 year old female inpatient with a history of hysterectomy and ascites and right adnexal cystic lesion on recent CT. EXAM: ULTRASOUND PELVIS TRANSVAGINAL TECHNIQUE: Transvaginal ultrasound examination of the pelvis was performed including evaluation of the uterus, ovaries, adnexal regions, and pelvic cul-de-sac. COMPARISON:  07/24/2021 CT abdomen/pelvis. FINDINGS: Uterus Surgically absent. Right ovary Measurements: 4.6 x 4.4 x 3.5 cm = volume: 37 mL. There is limited visualization of a 4.2 x 3.9 x 2.8 cm right ovarian cyst with questionable mildly thickened eccentric septation, and no internal vascularity on color Doppler or discrete mural nodularity. Left ovary Nonvisualization of the left ovary.  No left adnexal masses. Other findings:   No abnormal free fluid IMPRESSION: 1. Limited visualization of a 4.2 cm right ovarian cyst, which appears mildly complex with mildly thickened eccentric septation. This cystic lesion is indeterminate for malignancy particularly given the other worrisome findings of large volume ascites and possible omental caking on the recent CT. 2. No abnormal findings at the hysterectomy margin. 3. Nonvisualization of the left ovary.  No left adnexal masses. Electronically Signed   By: Ilona Sorrel M.D.   On: 07/26/2021 12:59   CT Abdomen Pelvis W Contrast  Result Date: 07/24/2021 CLINICAL DATA:  Abdominal distension, nausea. EXAM: CT ABDOMEN AND PELVIS WITH CONTRAST TECHNIQUE: Multidetector CT imaging of the abdomen and pelvis was performed using the standard protocol following bolus administration of intravenous contrast. CONTRAST:  11mL OMNIPAQUE IOHEXOL 350 MG/ML SOLN COMPARISON:  None. FINDINGS: Lower chest: Small bilateral pleural effusions are noted with adjacent subsegmental atelectasis. Hepatobiliary: No focal liver abnormality is seen. Status post cholecystectomy. No biliary dilatation. Pancreas: Unremarkable. No pancreatic ductal dilatation or surrounding inflammatory changes. Spleen: Calcified splenic granulomata are noted. Adrenals/Urinary Tract: Adrenal glands are unremarkable. Kidneys are normal, without renal calculi, focal lesion, or hydronephrosis. Bladder is unremarkable. Stomach/Bowel: Stomach appears normal. There is no evidence of bowel obstruction or inflammation. Status post appendectomy. Sigmoid diverticulosis is noted without inflammation. Vascular/Lymphatic: Aortic atherosclerosis. Mildly enlarged retroperitoneal and mesenteric lymph nodes are noted which may be inflammatory in etiology, but malignancy or metastatic disease cannot be excluded. Largest lymph node measures 13 mm in right periaortic region. Reproductive: Status post hysterectomy. 3.8 cm right ovarian cystic abnormality is noted. No  definite left adnexal abnormality is noted. Other: Moderate ascites is noted. Mild irregular densities are seen involving the omentum concerning for possible peritoneal carcinomatosis. Musculoskeletal: No acute or significant osseous findings. IMPRESSION: Moderate ascites is noted. Mildly enlarged retroperitoneal mesenteric lymph nodes are noted, the largest measuring 13 mm in the right periaortic region. While these may be inflammatory in etiology, malignancy or metastatic disease cannot be excluded. Mild irregular densities are seen involving the omentum which may represent inflammation, but peritoneal carcinomatosis cannot be excluded. Status post hysterectomy. 3.8 cm right ovarian cyst is noted. Recommend follow-up US in 6-12 months. Note: This recommendation does not apply to premenarchal patients and to those with increased risk (genetic, family history, elevated tumor markers or other high-risk factors) of ovarian  cancer. Reference: JACR 2020 Feb; 17(2):248-254. Sigmoid diverticulosis without inflammation. Small bilateral pleural effusions with minimal adjacent subsegmental atelectasis. Aortic Atherosclerosis (ICD10-I70.0). Electronically Signed   By: Marijo Conception M.D.   On: 07/24/2021 15:43   MR 3D Recon At Scanner  Result Date: 07/31/2021 CLINICAL DATA:  Adenocarcinoma of unknown primary, upper GI primary favored. EXAM: MRI ABDOMEN WITHOUT AND WITH CONTRAST (INCLUDING MRCP) TECHNIQUE: Multiplanar multisequence MR imaging of the abdomen was performed both before and after the administration of intravenous contrast. Heavily T2-weighted images of the biliary and pancreatic ducts were obtained, and three-dimensional MRCP images were rendered by post processing. CONTRAST:  78mL GADAVIST GADOBUTROL 1 MMOL/ML IV SOLN COMPARISON:  CT abdomen/pelvis dated 07/24/2021 FINDINGS: Extremely motion degraded images leading to markedly limited evaluation. Lower chest: Moderate right and small left pleural effusions.  Associated lower lobe atelectasis. Hepatobiliary: No focal hepatic lesion is seen. Status post cholecystectomy. No intrahepatic or extrahepatic ductal dilatation. Pancreas:  No parenchymal atrophy or ductal dilatation. Spleen:  Grossly unremarkable. Adrenals/Urinary Tract:  Adrenal glands are within normal limits. Kidneys are grossly unremarkable.  No hydronephrosis. Stomach/Bowel: Mild wall thickening in the region of the gastric antrum/duodenal bulb, poorly evaluated. The visualized bowel is grossly unremarkable. Vascular/Lymphatic:  No evidence of abdominal aortic aneurysm. Small upper abdominal/retroperitoneal lymph nodes, better evaluated on CT. 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 Musculoskeletal: No focal osseous lesions. IMPRESSION: Markedly limited evaluation due to motion degradation. 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 Small upper abdominal/retroperitoneal lymph nodes, better evaluated on CT. Mild wall thickening in the region of the gastric antrum/duodenal bulb, poorly evaluated. Correlate with recent endoscopy. Moderate right and small left pleural effusions. Associated lower lobe atelectasis. Electronically Signed   By: Julian Hy M.D.   On: 07/31/2021 21:21   US Paracentesis  Result Date: 08/03/2021 INDICATION: History of metastatic cancer with malignant ascites. Request for therapeutic paracentesis EXAM: ULTRASOUND GUIDED THERAPEUTIC PARACENTESIS MEDICATIONS: 10 mL 1% lidocaine COMPLICATIONS: None immediate. PROCEDURE: Informed written consent was obtained from the patient after a discussion of the risks, benefits and alternatives to treatment. A timeout was performed prior to the initiation of the procedure. Initial ultrasound scanning demonstrates a large amount of ascites within the left lower abdominal quadrant. The left lower abdomen was prepped and draped in the usual sterile fashion. 1% lidocaine was used for local  anesthesia. Following this, a 6 Fr Safe-T-Centesis catheter was introduced. An ultrasound image was saved for documentation purposes. The paracentesis was performed. The catheter was removed and a dressing was applied. The patient tolerated the procedure well without immediate post procedural complication. FINDINGS: A total of approximately 6 L of clear, yellow fluid was removed. IMPRESSION: Successful ultrasound-guided paracentesis yielding 6 liters of peritoneal fluid. Read by: Narda Rutherford, NP Electronically Signed   By: Michaelle Birks M.D.   On: 08/03/2021 15:35   US Paracentesis  Result Date: 07/25/2021 INDICATION: Concern for intra-abdominal malignancy, now with symptomatic ascites. Please perform ultrasound-guided paracentesis for diagnostic and therapeutic purposes. Max paracentesis volume of 5 L requested. EXAM: ULTRASOUND GUIDED DIAGNOSTIC AND THERAPEUTIC PARACENTESIS COMPARISON:  CT abdomen and pelvis-earlier same day MEDICATIONS: None. COMPLICATIONS: None immediate. TECHNIQUE: Informed written consent was obtained from the patient after a discussion of the risks, benefits and alternatives to treatment. A timeout was performed prior to the initiation of the procedure. Initial ultrasound scanning demonstrates a large amount of ascites within the left lower abdomen which was subsequently prepped and draped in the usual  sterile fashion. 1% lidocaine with epinephrine was used for local anesthesia. An ultrasound image was saved for documentation purposed. An 8 Fr Safe-T-Centesis catheter was introduced. The paracentesis was performed. The catheter was removed and a dressing was applied. The patient tolerated the procedure well without immediate post procedural complication. FINDINGS: A total of approximately 5 liters of serous fluid was removed. Samples were sent to the laboratory as requested by the clinical team. IMPRESSION: Successful ultrasound-guided paracentesis yielding 5 liters of peritoneal fluid.  Electronically Signed   By: Sandi Mariscal M.D.   On: 07/25/2021 08:18   MR ABDOMEN MRCP W WO CONTAST  Result Date: 07/31/2021 CLINICAL DATA:  Adenocarcinoma of unknown primary, upper GI primary favored. EXAM: MRI ABDOMEN WITHOUT AND WITH CONTRAST (INCLUDING MRCP) TECHNIQUE: Multiplanar multisequence MR imaging of the abdomen was performed both before and after the administration of intravenous contrast. Heavily T2-weighted images of the biliary and pancreatic ducts were obtained, and three-dimensional MRCP images were rendered by post processing. CONTRAST:  67mL GADAVIST GADOBUTROL 1 MMOL/ML IV SOLN COMPARISON:  CT abdomen/pelvis dated 07/24/2021 FINDINGS: Extremely motion degraded images leading to markedly limited evaluation. Lower chest: Moderate right and small left pleural effusions. Associated lower lobe atelectasis. Hepatobiliary: No focal hepatic lesion is seen. Status post cholecystectomy. No intrahepatic or extrahepatic ductal dilatation. Pancreas:  No parenchymal atrophy or ductal dilatation. Spleen:  Grossly unremarkable. Adrenals/Urinary Tract:  Adrenal glands are within normal limits. Kidneys are grossly unremarkable.  No hydronephrosis. Stomach/Bowel: Mild wall thickening in the region of the gastric antrum/duodenal bulb, poorly evaluated. The visualized bowel is grossly unremarkable. Vascular/Lymphatic:  No evidence of abdominal aortic aneurysm. Small upper abdominal/retroperitoneal lymph nodes, better evaluated on CT. 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 Musculoskeletal: No focal osseous lesions. IMPRESSION: Markedly limited evaluation due to motion degradation. 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 Small upper abdominal/retroperitoneal lymph nodes, better evaluated on CT. Mild wall thickening in the region of the gastric antrum/duodenal bulb, poorly evaluated. Correlate with recent endoscopy. Moderate right and small left pleural  effusions. Associated lower lobe atelectasis. Electronically Signed   By: Julian Hy M.D.   On: 07/31/2021 21:21   [place] ASCITES (ABDOMEN LIMITED)  Result Date: 07/27/2021 CLINICAL DATA:  Patient is complaining of leaking at previous paracentesis puncture site. EXAM: LIMITED ABDOMEN ULTRASOUND FOR ASCITES TECHNIQUE: Limited ultrasound survey for ascites was performed in all four abdominal quadrants. COMPARISON:  07/24/2021 FINDINGS: Small to moderate amount of ascites in the right upper quadrant and right lower quadrant. Minimal ascites in left upper quadrant. No significant ascites in left lower quadrant. IMPRESSION: Small to moderate amount of ascites in the right abdomen. Paracentesis not performed. Electronically Signed   By: Markus Daft M.D.   On: 07/27/2021 13:32    ASSESSMENT & PLAN:    80 year old female with  1.  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  2.retroperitoneal lymphadenopathy and possible peritoneal carcinomatosis -07/24/2021 CT of the abdomen/pelvis with contrast- "Moderate ascites is noted. Mildly enlarged retroperitoneal mesenteric lymph nodes are noted, the largest measuring 13 mm in the right periaortic region. While these may be inflammatory in etiology, malignancy or metastatic disease cannot be excluded. Mild irregular densities are seen involving the omentum which may represent inflammation, but peritoneal carcinomatosis cannot be excluded. Status post hysterectomy. 3.8 cm right ovarian cyst is noted. Recommend an EGD tomorrow [place] in 6-12 months. Note: This recommendation does not apply  to premenarchal patients and to those with increased risk (genetic, family history, elevated tumor markers or other high-risk factors) of ovarian cancer. Reference: JACR 2020 Feb; 17(2):248-254. Sigmoid diverticulosis without inflammation. Small bilateral pleural effusions with minimal adjacent  subsegmental atelectasis. Aortic Atherosclerosis (ICD10-I70.0)." -07/26/2021 transvaginal ultrasound- "1. Limited visualization of a 4.2 cm right ovarian cyst, which appears mildly complex with mildly thickened eccentric septation. This cystic lesion is indeterminate for malignancy particularly given the other worrisome findings of large volume ascites and possible omental caking on the recent CT. 2. No abnormal findings at the hysterectomy margin.3. Nonvisualization of the left ovary.  No left adnexal masses." -07/31/2021 ERCP-gastritis which was biopsied, mucosal changes in the duodenum which was biopsied.  Biopsies negative for malignancy. -07/31/2021 MRCP- "Markedly limited evaluation due to motion degradation. 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  Small upper abdominal/retroperitoneal lymph nodes, better evaluated on CT. Mild wall thickening in the region of the gastric antrum/duodenal bulb, poorly evaluated. Correlate with recent endoscopy. Moderate right and small left pleural effusions. Associated lower lobe atelectasis."   3.  Protein calorie malnutrition   4.  Hypertension   5.  Hyperlipidemia   6.  Type II diabetes mellitus   PLAN  -Discussed biopsy results from ERCP with the patient and her daughter who was at the bedside.  The cytology from the ascitic fluid on admission most consistent with upper GI primary, but MRCP and EGD did not identify a definitive mass and biopsies were negative. -Would recommend proceeding with outpatient PET scan.  I explained to the patient and her daughter that this will need to be scheduled once she is discharged from the hospital. -I further discussed that we may need to consider systemic chemotherapy with either regimen for cancer of unknown primary versus a regimen for a GI primary.  However, final decisions were made once we have additional information from her PET scan. -No additional work-up is planned inpatient.  Okay to discharge  from our standpoint. -Hospitalist requested follow-up early next week to monitor the patient closely for need for repeat paracentesis.  We can arrange for outpatient follow-up in our office early next week.  Mikey Bussing, DNP, AGPCNP-BC, AOCNP  ADDENDUM  .Patient was Personally and independently interviewed, examined and relevant elements of the history of present illness were reviewed in details and an assessment and plan was created. All elements of the patient's history of present illness , assessment and plan were discussed in details with Mikey Bussing, DNP, AGPCNP-BC, AOCNP. The above documentation reflects our combined findings assessment and plan.   Had a detailed discussion with the patient her daughter at bedside.  We discussed available lab results, pathology results and imaging studies.  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 Primary tumor not apparent on EGD and MRCP. We discussed goals of care in details.  Patient currently quite fatigued and and has ascites that is rapidly accumulating.  We discussed options for palliative chemotherapy for adenocarcinoma of unknown primary with potential better targeting of treatment if her primary tumor is found.  We discussed that the patient also has an option to pursue best supportive cares through hospice.  We will likely need intermittent paracentesis as outpatient for comfort as needed.  If patient chooses best supportive cares through hospice would need to consider placement of a peritoneal drain which can be emptied at home for comfort. Patient okay to discharge from oncology standpoint.  Will arrange for outpatient PET scan.  [name] MD MS

## 2021-08-05 ENCOUNTER — Other Ambulatory Visit: Payer: Self-pay | Admitting: Hematology

## 2021-08-05 DIAGNOSIS — C786 Secondary malignant neoplasm of retroperitoneum and peritoneum: Secondary | ICD-10-CM

## 2021-08-05 DIAGNOSIS — R180 Malignant ascites: Secondary | ICD-10-CM

## 2021-08-05 LAB — GLUCOSE, CAPILLARY: Glucose-Capillary: 141 mg/dL — ABNORMAL HIGH (ref 70–99)

## 2021-08-05 MED ORDER — MEGESTROL ACETATE 40 MG PO TABS: 40.0000 mg | ORAL_TABLET | Freq: Two times a day (BID) | ORAL | 1 refills | Status: DC

## 2021-08-05 MED ORDER — METOPROLOL SUCCINATE ER 25 MG PO TB24: 25.0000 mg | ORAL_TABLET | Freq: Every day | ORAL | 0 refills | Status: DC

## 2021-08-05 MED ORDER — ONDANSETRON 4 MG PO TBDP: 4.0000 mg | ORAL_TABLET | Freq: Three times a day (TID) | ORAL | 0 refills | Status: AC | PRN

## 2021-08-05 NOTE — Progress Notes (Signed)
Pt discharged to home instructions reviewed with pt and dtg acknowledged understanding. SRP, RN

## 2021-08-05 NOTE — Discharge Summary (Signed)
Physician Discharge Summary  Amanda Gordon XFG:[id number] DOB: 1941-06-16 DOA: 07/24/2021  PCP: System, Provider Not In  Admit date: 07/24/2021 Discharge date: 08/05/2021  Admitted From: home Disposition:  home  Recommendations for Outpatient Follow-up:  Follow up with Oncology in 1 week Please obtain BMP/CBC in one week  Home Health: PT Equipment/Devices: none  Discharge Condition: stable CODE STATUS: Full code Diet recommendation: regular  HPI: Per admitting MD, Amanda Gordon is a 80 y.o. female with medical history significant of type 2 diabetes, unspecified anemia, hyperlipidemia, hypertension, osteoarthritis of the left knee, type 2 diabetes mellitus, class I obesity who is coming to the emergency department due to abdominal distention after first losing about 22 pounds in the month and then regaining 17 pounds of mostly ascitic fluid.  She has also had persistent dry heaving with significantly decreased appetite.  She has had constipation and had an episode of hematochezia several days ago.  No emesis, diarrhea or melena.  Denied flank pain, dysuria, frequency or hematuria.  No fever, night sweats but positive chills.  Denied rhinorrhea, sore throat, dyspnea, wheezing or hemoptysis.  Denied chest pain, palpitations, diaphoresis, dizziness, PND orthopnea.  She had some lower extremity edema recently.  No polyuria, polydipsia, polyphagia or blurred vision.  Hospital Course / Discharge diagnoses: Principal Problem Abdominal pain nausea, metastatic cancer, malignant ascites unknown primaryHad concerns for carcinomatosis and had been seen by oncology.  CEA CA 19-9 and CA125 elevated.  Transvaginal ultrasound showed 4.62 cm right ovarian cyst somewhat complex indeterminate for malignancy.  Discussed with Dr. Irene Limbo, he reviewed cytology and shows malignant cells consistent with malignant ascites, and preliminary immunohistochemical markers suggesting an upper GI source. GI consulted,  underwent an EGD on 10/14 which was unrevealing.  An MRI MRCP done 10/14 showed mild wall thickening in the region of the gastric antrum/duodenal bulb, poorly evaluated.  Biopsies from the EGD were negative.  Unfortunately her work-up did not show metastatic malignant cells in her ascitic fluid but no primary cancer has been identified.  She will be discharged home in stable condition and will have PET scan and further follow-up as an outpatient.  She had recurrent ascites and received paracentesis on 10/10 and 10/17.  She was briefly placed on Lasix after the first paracentesis but it did not help with the ascites recurrence, caused mild creatinine elevation and hyponatremia.  Lasix was held and creatinine and sodium improving.  Active Problems Borderline prolonged QT interval-stable Hypokalemia-improved, now normal Hyponatremia, hypovolemic -mild, improving, encourage p.o. intake Mild protein malnutrition -Likely secondary to underlying malignancy/poor appetite.  Prescribed Megace on discharge Hypertension -continue metoprolol at a much low-dose, she has been having soft blood pressures in the hospital likely due to recurrent ascites/overall deconditioning, hold her other antihypertensives on discharge Type 2 diabetes mellitus  -resume home medications on discharge  Sepsis ruled out   Discharge Instructions   Allergies as of 08/05/2021       Reactions   Codeine Nausea Only   "Dizziness"        Medication List     STOP taking these medications    amLODipine 10 MG tablet Commonly known as: NORVASC   lisinopril 20 MG tablet Commonly known as: ZESTRIL       TAKE these medications    acetaminophen 500 MG tablet Commonly known as: TYLENOL Take 1,000 mg by mouth every 6 (six) hours as needed for mild pain.   atorvastatin 40 MG tablet Commonly known as: LIPITOR Take 40 mg by  mouth every evening.   CALCIUM-VITAMIN D PO Take 1 tablet by mouth daily.   CENTRUM SILVER  PO Take 1 tablet by mouth daily.   GOODYS BODY PAIN PO Take 2 packets by mouth daily as needed (pain).   megestrol 40 MG tablet Commonly known as: MEGACE Take 1 tablet (40 mg total) by mouth 2 (two) times daily.   metFORMIN 500 MG tablet Commonly known as: GLUCOPHAGE Take 1,000 mg by mouth in the morning and at bedtime.   metoprolol succinate 25 MG 24 hr tablet Commonly known as: Toprol XL Take 1 tablet (25 mg total) by mouth daily. What changed:  medication strength how much to take when to take this   omeprazole 20 MG capsule Commonly known as: PRILOSEC Take 20 mg by mouth daily as needed (indigestion (hiatal hernia flare)).   ondansetron 4 MG disintegrating tablet Commonly known as: ZOFRAN-ODT Take 1 tablet (4 mg total) by mouth every 8 (eight) hours as needed for nausea or vomiting.        Follow-up Information     [facility], [facility] Follow up in 4 day(s).   Why: Follow-up for malignant ascites Contact information: [address] [place] [address] [phone]         [facility], [facility] Follow up.   Specialty: Home Health Services Why: they will call for home visit to start care. Contact information: [place] [address] [phone]                 Consultations: Oncology  GI  Procedures/Studies: EGD  CT CHEST W CONTRAST  Result Date: 07/29/2021 CLINICAL DATA:  Cancer of unknown primary. Staging CT scan. EXAM: CT CHEST WITH CONTRAST TECHNIQUE: Multidetector CT imaging of the chest was performed during intravenous contrast administration. CONTRAST:  62mL OMNIPAQUE IOHEXOL 350 MG/ML SOLN COMPARISON:  CT abdomen/pelvis 07/24/2021 FINDINGS: Cardiovascular: The heart is normal in size. No pericardial effusion. The aorta is normal in caliber. No dissection. Scattered atherosclerotic calcifications. The branch vessels are patent. Scattered coronary artery calcifications. Mediastinum/Nodes: No  mediastinal or hilar mass or lymphadenopathy. Small scattered lymph nodes are noted. The esophagus is unremarkable. Thyroid gland is unremarkable. Lungs/Pleura: Bilateral pleural effusions, moderate on the right and mild on the left. No enhancing pleural nodules are identified. No pulmonary nodules or pulmonary lesions. Upper Abdomen: Upper abdominal ascites again demonstrated. Calcified granulomas are noted in the spleen. Musculoskeletal: No breast masses, supraclavicular or axillary adenopathy. The bony thorax is intact. Benign T8 hemangioma noted. IMPRESSION: 1. Bilateral pleural effusions, moderate on the right and mild on the left. 2. No mediastinal or hilar mass or adenopathy. 3. No pulmonary nodules or pulmonary lesions. 4. Upper abdominal ascites. Electronically Signed   By: Marijo Sanes M.D.   On: 07/29/2021 12:28   US PELVIS TRANSVAGINAL NON-OB (TV ONLY)  Result Date: 07/26/2021 CLINICAL DATA:  80 year old female inpatient with a history of hysterectomy and ascites and right adnexal cystic lesion on recent CT. EXAM: ULTRASOUND PELVIS TRANSVAGINAL TECHNIQUE: Transvaginal ultrasound examination of the pelvis was performed including evaluation of the uterus, ovaries, adnexal regions, and pelvic cul-de-sac. COMPARISON:  07/24/2021 CT abdomen/pelvis. FINDINGS: Uterus Surgically absent. Right ovary Measurements: 4.6 x 4.4 x 3.5 cm = volume: 37 mL. There is limited visualization of a 4.2 x 3.9 x 2.8 cm right ovarian cyst with questionable mildly thickened eccentric septation, and no internal vascularity on color Doppler or discrete mural nodularity. Left ovary Nonvisualization of the left ovary.  No left adnexal masses. Other findings:  No abnormal free fluid IMPRESSION: 1. Limited visualization of a 4.2 cm right ovarian cyst, which appears mildly complex with mildly thickened eccentric septation. This cystic lesion is indeterminate for malignancy particularly given the other worrisome findings of large  volume ascites and possible omental caking on the recent CT. 2. No abnormal findings at the hysterectomy margin. 3. Nonvisualization of the left ovary.  No left adnexal masses. Electronically Signed   By: Ilona Sorrel M.D.   On: 07/26/2021 12:59   CT Abdomen Pelvis W Contrast  Result Date: 07/24/2021 CLINICAL DATA:  Abdominal distension, nausea. EXAM: CT ABDOMEN AND PELVIS WITH CONTRAST TECHNIQUE: Multidetector CT imaging of the abdomen and pelvis was performed using the standard protocol following bolus administration of intravenous contrast. CONTRAST:  12mL OMNIPAQUE IOHEXOL 350 MG/ML SOLN COMPARISON:  None. FINDINGS: Lower chest: Small bilateral pleural effusions are noted with adjacent subsegmental atelectasis. Hepatobiliary: No focal liver abnormality is seen. Status post cholecystectomy. No biliary dilatation. Pancreas: Unremarkable. No pancreatic ductal dilatation or surrounding inflammatory changes. Spleen: Calcified splenic granulomata are noted. Adrenals/Urinary Tract: Adrenal glands are unremarkable. Kidneys are normal, without renal calculi, focal lesion, or hydronephrosis. Bladder is unremarkable. Stomach/Bowel: Stomach appears normal. There is no evidence of bowel obstruction or inflammation. Status post appendectomy. Sigmoid diverticulosis is noted without inflammation. Vascular/Lymphatic: Aortic atherosclerosis. Mildly enlarged retroperitoneal and mesenteric lymph nodes are noted which may be inflammatory in etiology, but malignancy or metastatic disease cannot be excluded. Largest lymph node measures 13 mm in right periaortic region. Reproductive: Status post hysterectomy. 3.8 cm right ovarian cystic abnormality is noted. No definite left adnexal abnormality is noted. Other: Moderate ascites is noted. Mild irregular densities are seen involving the omentum concerning for possible peritoneal carcinomatosis. Musculoskeletal: No acute or significant osseous findings. IMPRESSION: Moderate ascites is  noted. Mildly enlarged retroperitoneal mesenteric lymph nodes are noted, the largest measuring 13 mm in the right periaortic region. While these may be inflammatory in etiology, malignancy or metastatic disease cannot be excluded. Mild irregular densities are seen involving the omentum which may represent inflammation, but peritoneal carcinomatosis cannot be excluded. Status post hysterectomy. 3.8 cm right ovarian cyst is noted. Recommend follow-up US in 6-12 months. Note: This recommendation does not apply to premenarchal patients and to those with increased risk (genetic, family history, elevated tumor markers or other high-risk factors) of ovarian cancer. Reference: JACR 2020 Feb; 17(2):248-254. Sigmoid diverticulosis without inflammation. Small bilateral pleural effusions with minimal adjacent subsegmental atelectasis. Aortic Atherosclerosis (ICD10-I70.0). Electronically Signed   By: Marijo Conception M.D.   On: 07/24/2021 15:43   MR 3D Recon At Scanner  Result Date: 07/31/2021 CLINICAL DATA:  Adenocarcinoma of unknown primary, upper GI primary favored. EXAM: MRI ABDOMEN WITHOUT AND WITH CONTRAST (INCLUDING MRCP) TECHNIQUE: Multiplanar multisequence MR imaging of the abdomen was performed both before and after the administration of intravenous contrast. Heavily T2-weighted images of the biliary and pancreatic ducts were obtained, and three-dimensional MRCP images were rendered by post processing. CONTRAST:  30mL GADAVIST GADOBUTROL 1 MMOL/ML IV SOLN COMPARISON:  CT abdomen/pelvis dated 07/24/2021 FINDINGS: Extremely motion degraded images leading to markedly limited evaluation. Lower chest: Moderate right and small left pleural effusions. Associated lower lobe atelectasis. Hepatobiliary: No focal hepatic lesion is seen. Status post cholecystectomy. No intrahepatic or extrahepatic ductal dilatation. Pancreas:  No parenchymal atrophy or ductal dilatation. Spleen:  Grossly unremarkable. Adrenals/Urinary Tract:   Adrenal glands are within normal limits. Kidneys are grossly unremarkable.  No hydronephrosis. Stomach/Bowel: Mild  wall thickening in the region of the gastric antrum/duodenal bulb, poorly evaluated. The visualized bowel is grossly unremarkable. Vascular/Lymphatic:  No evidence of abdominal aortic aneurysm. Small upper abdominal/retroperitoneal lymph nodes, better evaluated on CT. 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 Musculoskeletal: No focal osseous lesions. IMPRESSION: Markedly limited evaluation due to motion degradation. 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 Small upper abdominal/retroperitoneal lymph nodes, better evaluated on CT. Mild wall thickening in the region of the gastric antrum/duodenal bulb, poorly evaluated. Correlate with recent endoscopy. Moderate right and small left pleural effusions. Associated lower lobe atelectasis. Electronically Signed   By: Julian Hy M.D.   On: 07/31/2021 21:21   US Paracentesis  Result Date: 08/03/2021 INDICATION: History of metastatic cancer with malignant ascites. Request for therapeutic paracentesis EXAM: ULTRASOUND GUIDED THERAPEUTIC PARACENTESIS MEDICATIONS: 10 mL 1% lidocaine COMPLICATIONS: None immediate. PROCEDURE: Informed written consent was obtained from the patient after a discussion of the risks, benefits and alternatives to treatment. A timeout was performed prior to the initiation of the procedure. Initial ultrasound scanning demonstrates a large amount of ascites within the left lower abdominal quadrant. The left lower abdomen was prepped and draped in the usual sterile fashion. 1% lidocaine was used for local anesthesia. Following this, a 6 Fr Safe-T-Centesis catheter was introduced. An ultrasound image was saved for documentation purposes. The paracentesis was performed. The catheter was removed and a dressing was applied. The patient tolerated the procedure well without  immediate post procedural complication. FINDINGS: A total of approximately 6 L of clear, yellow fluid was removed. IMPRESSION: Successful ultrasound-guided paracentesis yielding 6 liters of peritoneal fluid. Read by: Narda Rutherford, NP Electronically Signed   By: Michaelle Birks M.D.   On: 08/03/2021 15:35   US Paracentesis  Result Date: 07/25/2021 INDICATION: Concern for intra-abdominal malignancy, now with symptomatic ascites. Please perform ultrasound-guided paracentesis for diagnostic and therapeutic purposes. Max paracentesis volume of 5 L requested. EXAM: ULTRASOUND GUIDED DIAGNOSTIC AND THERAPEUTIC PARACENTESIS COMPARISON:  CT abdomen and pelvis-earlier same day MEDICATIONS: None. COMPLICATIONS: None immediate. TECHNIQUE: Informed written consent was obtained from the patient after a discussion of the risks, benefits and alternatives to treatment. A timeout was performed prior to the initiation of the procedure. Initial ultrasound scanning demonstrates a large amount of ascites within the left lower abdomen which was subsequently prepped and draped in the usual sterile fashion. 1% lidocaine with epinephrine was used for local anesthesia. An ultrasound image was saved for documentation purposed. An 8 Fr Safe-T-Centesis catheter was introduced. The paracentesis was performed. The catheter was removed and a dressing was applied. The patient tolerated the procedure well without immediate post procedural complication. FINDINGS: A total of approximately 5 liters of serous fluid was removed. Samples were sent to the laboratory as requested by the clinical team. IMPRESSION: Successful ultrasound-guided paracentesis yielding 5 liters of peritoneal fluid. Electronically Signed   By: Sandi Mariscal M.D.   On: 07/25/2021 08:18   MR ABDOMEN MRCP W WO CONTAST  Result Date: 07/31/2021 CLINICAL DATA:  Adenocarcinoma of unknown primary, upper GI primary favored. EXAM: MRI ABDOMEN WITHOUT AND WITH CONTRAST (INCLUDING MRCP)  TECHNIQUE: Multiplanar multisequence MR imaging of the abdomen was performed both before and after the administration of intravenous contrast. Heavily T2-weighted images of the biliary and pancreatic ducts were obtained, and three-dimensional MRCP images were rendered by post processing. CONTRAST:  48mL GADAVIST GADOBUTROL 1 MMOL/ML IV SOLN COMPARISON:  CT abdomen/pelvis dated 07/24/2021 FINDINGS: Extremely motion degraded images leading to  markedly limited evaluation. Lower chest: Moderate right and small left pleural effusions. Associated lower lobe atelectasis. Hepatobiliary: No focal hepatic lesion is seen. Status post cholecystectomy. No intrahepatic or extrahepatic ductal dilatation. Pancreas:  No parenchymal atrophy or ductal dilatation. Spleen:  Grossly unremarkable. Adrenals/Urinary Tract:  Adrenal glands are within normal limits. Kidneys are grossly unremarkable.  No hydronephrosis. Stomach/Bowel: Mild wall thickening in the region of the gastric antrum/duodenal bulb, poorly evaluated. The visualized bowel is grossly unremarkable. Vascular/Lymphatic:  No evidence of abdominal aortic aneurysm. Small upper abdominal/retroperitoneal lymph nodes, better evaluated on CT. 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 Musculoskeletal: No focal osseous lesions. IMPRESSION: Markedly limited evaluation due to motion degradation. 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 Small upper abdominal/retroperitoneal lymph nodes, better evaluated on CT. Mild wall thickening in the region of the gastric antrum/duodenal bulb, poorly evaluated. Correlate with recent endoscopy. Moderate right and small left pleural effusions. Associated lower lobe atelectasis. Electronically Signed   By: Julian Hy M.D.   On: 07/31/2021 21:21   [place] ASCITES (ABDOMEN LIMITED)  Result Date: 07/27/2021 CLINICAL DATA:  Patient is complaining of leaking at previous paracentesis puncture  site. EXAM: LIMITED ABDOMEN ULTRASOUND FOR ASCITES TECHNIQUE: Limited ultrasound survey for ascites was performed in all four abdominal quadrants. COMPARISON:  07/24/2021 FINDINGS: Small to moderate amount of ascites in the right upper quadrant and right lower quadrant. Minimal ascites in left upper quadrant. No significant ascites in left lower quadrant. IMPRESSION: Small to moderate amount of ascites in the right abdomen. Paracentesis not performed. Electronically Signed   By: Markus Daft M.D.   On: 07/27/2021 13:32     Subjective: - no chest pain, shortness of breath, no abdominal pain, nausea or vomiting.   Discharge Exam: BP 96/74   Pulse 90   Temp 98.2 F (36.8 C) (Oral)   Resp 16   Ht 5\' 2"  (1.575 m)   Wt 75.1 kg   SpO2 97%   BMI 30.28 kg/m   General: Pt is alert, awake, not in acute distress Cardiovascular: RRR, S1/S2 +, no rubs, no gallops Respiratory: CTA bilaterally, no wheezing, no rhonchi Abdominal: Soft, NT, ND, bowel sounds + Extremities: no edema, no cyanosis   The results of significant diagnostics from this hospitalization (including imaging, microbiology, ancillary and laboratory) are listed below for reference.     Microbiology: No results found for this or any previous visit (from the past 240 hour(s)).   Labs: Basic Metabolic Panel: Recent Labs  Lab 07/31/21 0319 08/01/21 0334 08/02/21 0317 08/03/21 0355 08/04/21 0333  NA 129* 129* 127* 126* 128*  K 4.2 4.2 4.2 3.9 3.9  CL 93* 92* 92* 92* 94*  CO2 27 28 22 22 22   GLUCOSE 115* 113* 132* 138* 141*  BUN 29* 29* 27* 24* 23  CREATININE 1.27* 1.04* 0.84 0.91 0.81  CALCIUM 8.4* 8.5* 8.6* 8.6* 8.3*  MG  --   --  1.9  --   --    Liver Function Tests: Recent Labs  Lab 07/30/21 0321 08/02/21 0317  AST 18 19  ALT 8 10  ALKPHOS 82 79  BILITOT 0.4 0.4  PROT 5.7* 5.8*  ALBUMIN 2.4* 2.5*   CBC: Recent Labs  Lab 07/30/21 0321 07/31/21 0319 08/02/21 0317 08/03/21 0355 08/04/21 0333  WBC 10.1  9.2 9.9 10.6* 13.4*  HGB 13.4 12.8 13.6 13.4 13.5  HCT 41.1 39.2 41.4 40.4 41.6  MCV 83.4 83.9 83.1 82.6 83.9  PLT 448* 397  500* 447* 489*   CBG: Recent Labs  Lab 08/04/21 0733 08/04/21 1152 08/04/21 1626 08/04/21 1948 08/05/21 0736  GLUCAP 168* 152* 135* 120* 141*   Hgb A1c No results for input(s): HGBA1C in the last 72 hours. Lipid Profile No results for input(s): CHOL, HDL, LDLCALC, TRIG, CHOLHDL, LDLDIRECT in the last 72 hours. Thyroid function studies No results for input(s): TSH, T4TOTAL, T3FREE, THYROIDAB in the last 72 hours.  Invalid input(s): FREET3 Urinalysis    Component Value Date/Time   COLORURINE STRAW (A) 07/26/2021 2130   APPEARANCEUR CLEAR 07/26/2021 2130   LABSPEC 1.004 (L) 07/26/2021 2130   PHURINE 7.0 07/26/2021 2130   GLUCOSEU NEGATIVE 07/26/2021 2130   HGBUR MODERATE (A) 07/26/2021 2130   BILIRUBINUR NEGATIVE 07/26/2021 2130   KETONESUR NEGATIVE 07/26/2021 2130   PROTEINUR NEGATIVE 07/26/2021 2130   NITRITE NEGATIVE 07/26/2021 2130   LEUKOCYTESUR NEGATIVE 07/26/2021 2130    FURTHER DISCHARGE INSTRUCTIONS:   Get Medicines reviewed and adjusted: Please take all your medications with you for your next visit with your Primary MD   Laboratory/radiological data: Please request your Primary MD to go over all hospital tests and procedure/radiological results at the follow up, please ask your Primary MD to get all Hospital records sent to his/her office.   In some cases, they will be blood work, cultures and biopsy results pending at the time of your discharge. Please request that your primary care M.D. goes through all the records of your hospital data and follows up on these results.   Also Note the following: If you experience worsening of your admission symptoms, develop shortness of breath, life threatening emergency, suicidal or homicidal thoughts you must seek medical attention immediately by calling 911 or calling your MD immediately  if  symptoms less severe.   You must read complete instructions/literature along with all the possible adverse reactions/side effects for all the Medicines you take and that have been prescribed to you. Take any new Medicines after you have completely understood and accpet all the possible adverse reactions/side effects.    Do not drive when taking Pain medications or sleeping medications (Benzodaizepines)   Do not take more than prescribed Pain, Sleep and Anxiety Medications. It is not advisable to combine anxiety,sleep and pain medications without talking with your primary care practitioner   Special Instructions: If you have smoked or chewed Tobacco  in the last 2 yrs please stop smoking, stop any regular Alcohol  and or any Recreational drug use.   Wear Seat belts while driving.   Please note: You were cared for by a hospitalist during your hospital stay. Once you are discharged, your primary care physician will handle any further medical issues. Please note that NO REFILLS for any discharge medications will be authorized once you are discharged, as it is imperative that you return to your primary care physician (or establish a relationship with a primary care physician if you do not have one) for your post hospital discharge needs so that they can reassess your need for medications and monitor your lab values.  Time coordinating discharge: 40 minutes  SIGNED:  Marzetta Board, MD, PhD 08/05/2021, 9:42 AM

## 2021-08-05 NOTE — Progress Notes (Signed)
   08/05/21 0845  Vitals  BP 97/66  MAP (mmHg) 76  BP Method Automatic  Pulse Rate 79   MD updated. SRP, RN

## 2021-08-05 NOTE — Progress Notes (Unsigned)
New pt appointment with Dr Irene Limbo requested .  PET scan requested with Central scheduling directly for asap.

## 2021-08-05 NOTE — Progress Notes (Unsigned)
Pet

## 2021-08-06 ENCOUNTER — Telehealth: Payer: Self-pay | Admitting: Hematology

## 2021-08-06 NOTE — Telephone Encounter (Signed)
Scheduled appt per 10/19 staff msg from [employer]. Pt is aware of appt date and time.

## 2021-08-10 DIAGNOSIS — M1712 Unilateral primary osteoarthritis, left knee: Secondary | ICD-10-CM | POA: Diagnosis not present

## 2021-08-10 DIAGNOSIS — Z96652 Presence of left artificial knee joint: Secondary | ICD-10-CM | POA: Diagnosis not present

## 2021-08-13 ENCOUNTER — Other Ambulatory Visit: Payer: Self-pay

## 2021-08-13 ENCOUNTER — Inpatient Hospital Stay: Payer: Medicare Other | Attending: Hematology | Admitting: Hematology

## 2021-08-13 ENCOUNTER — Encounter (HOSPITAL_COMMUNITY): Payer: Self-pay | Admitting: Internal Medicine

## 2021-08-13 ENCOUNTER — Telehealth: Payer: Self-pay | Admitting: *Deleted

## 2021-08-13 ENCOUNTER — Inpatient Hospital Stay (HOSPITAL_COMMUNITY)
Admission: AD | Admit: 2021-08-13 | Discharge: 2021-08-19 | DRG: 374 | Disposition: A | Discharge status: Home Health | Payer: Medicare Other | Source: Ambulatory Visit | Attending: Internal Medicine | Admitting: Internal Medicine

## 2021-08-13 VITALS — BP 110/90 | HR 100 | Temp 97.3°F | Resp 18

## 2021-08-13 DIAGNOSIS — Z823 Family history of stroke: Secondary | ICD-10-CM

## 2021-08-13 DIAGNOSIS — E872 Acidosis, unspecified: Secondary | ICD-10-CM | POA: Diagnosis present

## 2021-08-13 DIAGNOSIS — E785 Hyperlipidemia, unspecified: Secondary | ICD-10-CM | POA: Diagnosis present

## 2021-08-13 DIAGNOSIS — E119 Type 2 diabetes mellitus without complications: Secondary | ICD-10-CM | POA: Diagnosis not present

## 2021-08-13 DIAGNOSIS — R0902 Hypoxemia: Secondary | ICD-10-CM | POA: Diagnosis not present

## 2021-08-13 DIAGNOSIS — Z66 Do not resuscitate: Secondary | ICD-10-CM | POA: Diagnosis not present

## 2021-08-13 DIAGNOSIS — I10 Essential (primary) hypertension: Secondary | ICD-10-CM | POA: Diagnosis not present

## 2021-08-13 DIAGNOSIS — E669 Obesity, unspecified: Secondary | ICD-10-CM | POA: Insufficient documentation

## 2021-08-13 DIAGNOSIS — C786 Secondary malignant neoplasm of retroperitoneum and peritoneum: Secondary | ICD-10-CM

## 2021-08-13 DIAGNOSIS — E43 Unspecified severe protein-calorie malnutrition: Secondary | ICD-10-CM | POA: Diagnosis present

## 2021-08-13 DIAGNOSIS — E440 Moderate protein-calorie malnutrition: Secondary | ICD-10-CM | POA: Insufficient documentation

## 2021-08-13 DIAGNOSIS — Z7189 Other specified counseling: Secondary | ICD-10-CM

## 2021-08-13 DIAGNOSIS — Z885 Allergy status to narcotic agent status: Secondary | ICD-10-CM

## 2021-08-13 DIAGNOSIS — R180 Malignant ascites: Secondary | ICD-10-CM | POA: Diagnosis not present

## 2021-08-13 DIAGNOSIS — E871 Hypo-osmolality and hyponatremia: Secondary | ICD-10-CM | POA: Diagnosis present

## 2021-08-13 DIAGNOSIS — M545 Low back pain, unspecified: Secondary | ICD-10-CM | POA: Diagnosis not present

## 2021-08-13 DIAGNOSIS — E441 Mild protein-calorie malnutrition: Secondary | ICD-10-CM

## 2021-08-13 DIAGNOSIS — Z803 Family history of malignant neoplasm of breast: Secondary | ICD-10-CM | POA: Insufficient documentation

## 2021-08-13 DIAGNOSIS — K921 Melena: Secondary | ICD-10-CM | POA: Insufficient documentation

## 2021-08-13 DIAGNOSIS — J9811 Atelectasis: Secondary | ICD-10-CM | POA: Insufficient documentation

## 2021-08-13 DIAGNOSIS — K5730 Diverticulosis of large intestine without perforation or abscess without bleeding: Secondary | ICD-10-CM | POA: Insufficient documentation

## 2021-08-13 DIAGNOSIS — Z833 Family history of diabetes mellitus: Secondary | ICD-10-CM | POA: Diagnosis not present

## 2021-08-13 DIAGNOSIS — Z20822 Contact with and (suspected) exposure to covid-19: Secondary | ICD-10-CM | POA: Diagnosis not present

## 2021-08-13 DIAGNOSIS — Z7984 Long term (current) use of oral hypoglycemic drugs: Secondary | ICD-10-CM

## 2021-08-13 DIAGNOSIS — N83201 Unspecified ovarian cyst, right side: Secondary | ICD-10-CM | POA: Insufficient documentation

## 2021-08-13 DIAGNOSIS — Z90710 Acquired absence of both cervix and uterus: Secondary | ICD-10-CM | POA: Diagnosis not present

## 2021-08-13 DIAGNOSIS — C801 Malignant (primary) neoplasm, unspecified: Secondary | ICD-10-CM | POA: Diagnosis not present

## 2021-08-13 DIAGNOSIS — E46 Unspecified protein-calorie malnutrition: Secondary | ICD-10-CM | POA: Insufficient documentation

## 2021-08-13 DIAGNOSIS — M1712 Unilateral primary osteoarthritis, left knee: Secondary | ICD-10-CM | POA: Insufficient documentation

## 2021-08-13 DIAGNOSIS — Z743 Need for continuous supervision: Secondary | ICD-10-CM | POA: Diagnosis not present

## 2021-08-13 DIAGNOSIS — I700 Atherosclerosis of aorta: Secondary | ICD-10-CM | POA: Insufficient documentation

## 2021-08-13 DIAGNOSIS — Z9049 Acquired absence of other specified parts of digestive tract: Secondary | ICD-10-CM | POA: Diagnosis not present

## 2021-08-13 DIAGNOSIS — J90 Pleural effusion, not elsewhere classified: Secondary | ICD-10-CM | POA: Insufficient documentation

## 2021-08-13 DIAGNOSIS — R188 Other ascites: Secondary | ICD-10-CM

## 2021-08-13 DIAGNOSIS — C253 Malignant neoplasm of pancreatic duct: Secondary | ICD-10-CM | POA: Insufficient documentation

## 2021-08-13 DIAGNOSIS — N179 Acute kidney failure, unspecified: Secondary | ICD-10-CM | POA: Diagnosis not present

## 2021-08-13 DIAGNOSIS — K5900 Constipation, unspecified: Secondary | ICD-10-CM | POA: Insufficient documentation

## 2021-08-13 DIAGNOSIS — Z8249 Family history of ischemic heart disease and other diseases of the circulatory system: Secondary | ICD-10-CM | POA: Diagnosis not present

## 2021-08-13 DIAGNOSIS — C269 Malignant neoplasm of ill-defined sites within the digestive system: Principal | ICD-10-CM | POA: Diagnosis present

## 2021-08-13 DIAGNOSIS — Z79899 Other long term (current) drug therapy: Secondary | ICD-10-CM

## 2021-08-13 DIAGNOSIS — K7011 Alcoholic hepatitis with ascites: Secondary | ICD-10-CM | POA: Diagnosis not present

## 2021-08-13 DIAGNOSIS — R1900 Intra-abdominal and pelvic swelling, mass and lump, unspecified site: Secondary | ICD-10-CM | POA: Diagnosis not present

## 2021-08-13 DIAGNOSIS — R110 Nausea: Secondary | ICD-10-CM | POA: Insufficient documentation

## 2021-08-13 LAB — GLUCOSE, CAPILLARY: Glucose-Capillary: 129 mg/dL — ABNORMAL HIGH (ref 70–99)

## 2021-08-13 MED ORDER — INSULIN ASPART 100 UNIT/ML IJ SOLN
0.0000 [IU] | Freq: Three times a day (TID) | INTRAMUSCULAR | Status: DC
  Administered 2021-08-14: 1 [IU] via SUBCUTANEOUS
  Administered 2021-08-14 – 2021-08-15 (×2): 2 [IU] via SUBCUTANEOUS
  Administered 2021-08-15 – 2021-08-16 (×3): 1 [IU] via SUBCUTANEOUS

## 2021-08-13 MED ORDER — INFLUENZA VAC A&B SA ADJ QUAD 0.5 ML IM PRSY
0.5000 mL | PREFILLED_SYRINGE | INTRAMUSCULAR | Status: DC
  Filled 2021-08-13: qty 0.5

## 2021-08-13 MED ORDER — ENOXAPARIN SODIUM 40 MG/0.4ML IJ SOSY: 40.0000 mg | PREFILLED_SYRINGE | INTRAMUSCULAR | Status: DC

## 2021-08-13 MED ORDER — ACETAMINOPHEN 650 MG RE SUPP: 650.0000 mg | Freq: Four times a day (QID) | RECTAL | Status: DC | PRN

## 2021-08-13 MED ORDER — ACETAMINOPHEN 325 MG PO TABS
650.0000 mg | ORAL_TABLET | Freq: Four times a day (QID) | ORAL | Status: DC | PRN
  Administered 2021-08-14 – 2021-08-18 (×6): 650 mg via ORAL
  Filled 2021-08-13 (×6): qty 2

## 2021-08-13 MED ORDER — ONDANSETRON HCL 4 MG/2ML IJ SOLN: 4.0000 mg | Freq: Four times a day (QID) | INTRAMUSCULAR | Status: DC | PRN

## 2021-08-13 MED ORDER — METOCLOPRAMIDE HCL 5 MG/ML IJ SOLN
10.0000 mg | Freq: Three times a day (TID) | INTRAMUSCULAR | Status: DC
  Administered 2021-08-13 – 2021-08-18 (×16): 10 mg via INTRAVENOUS
  Filled 2021-08-13 (×16): qty 2

## 2021-08-13 MED ORDER — MEGESTROL ACETATE 40 MG PO TABS
40.0000 mg | ORAL_TABLET | Freq: Two times a day (BID) | ORAL | Status: DC
  Administered 2021-08-14: 40 mg via ORAL
  Filled 2021-08-13 (×2): qty 1

## 2021-08-13 MED ORDER — PANTOPRAZOLE SODIUM 40 MG PO TBEC
40.0000 mg | DELAYED_RELEASE_TABLET | Freq: Every day | ORAL | Status: DC
Start: 2021-08-14 — End: 2021-08-16
  Administered 2021-08-14 – 2021-08-15 (×2): 40 mg via ORAL
  Filled 2021-08-13 (×2): qty 1

## 2021-08-13 MED ORDER — SENNOSIDES-DOCUSATE SODIUM 8.6-50 MG PO TABS: 1.0000 | ORAL_TABLET | Freq: Every evening | ORAL | Status: DC | PRN

## 2021-08-13 MED ORDER — ONDANSETRON HCL 4 MG PO TABS: 4.0000 mg | ORAL_TABLET | Freq: Four times a day (QID) | ORAL | Status: DC | PRN

## 2021-08-13 MED ORDER — ATORVASTATIN CALCIUM 40 MG PO TABS
40.0000 mg | ORAL_TABLET | Freq: Every evening | ORAL | Status: DC
  Administered 2021-08-14 – 2021-08-15 (×2): 40 mg via ORAL
  Filled 2021-08-13 (×2): qty 1

## 2021-08-13 NOTE — Telephone Encounter (Signed)
Connected with [place] daughter Ruffin Frederick.  Requested employer fax number to return completed FMLA form.  Ruffin Frederick "will return form to employer.  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   Received new FMLA for sibling with return fax number [phone].  Inetta Fermo of [place] arriving 2021/08/25 needs an intermittent leave.

## 2021-08-13 NOTE — Progress Notes (Signed)
Contacted Bed Control to have pt direct admitted. Pt will go home and wait for call to return to hospital. Dr Irene Limbo will speak to hospitalist about getting pt admitted for symptom management.

## 2021-08-13 NOTE — H&P (Signed)
History and Physical    Amanda Gordon YQM:[id number] DOB: 05-03-1941 DOA: 08/13/2021  PCP: Maury Dus, MD  Patient coming from: Direct admission from home  I have personally briefly reviewed patient's old medical records in [place]  Chief Complaint: Ascites  HPI: Amanda Gordon is a 80 y.o. 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  Patient recently admitted 07/24/2021-08/05/2021 for newly diagnosed malignant ascites.  Patient underwent paracentesis on 10/10 and 10/17.  Cytology and immunohistochemical markers were suggestive of upper GI source.  GI were consulted and patient underwent EGD on 10/14 which was unrevealing.  MRCP done 10/14 showed mild wall thickening in the region of the gastric antrum/duodenal bulb, poorly evaluated.  Biopsies from EGD were negative.  Of note, patient was last discharged on Lasix as it was causing mild creatinine elevation and hyponatremia while in hospital.  Patient was seen by oncology in clinic 10/27.  Patient was felt to have rapidly reaccumulating ascites.  Direct admission was requested for therapeutic paracentesis with anticipation of need for intermittent paracentesis for comfort as needed on an outpatient basis versus placement of peritoneal drain if patient electing for supportive care through hospice.  Patient states that she noticed reaccumulation of fluid in her abdomen over the last 6-7 days.  She has had continued nausea with poor appetite.  She says it is difficult for her to eat as she feels full even with small amounts of food.  She has not had any significant emesis.  She is not having any abdominal pain.  She feels fatigued and weak.  She denies any chest pain, dyspnea, cough, dysuria, diarrhea, or lower extremity swelling.  Review of Systems:  All systems reviewed and are negative except as documented in history of present illness above.   Past Medical History:  Diagnosis Date   Allergy    Anemia    Complication of anesthesia    Hyperlipidemia    Hypertension    PONV (postoperative nausea and vomiting)    Primary localized osteoarthritis of left knee 07/21/2021   Type 2 diabetes mellitus ([place])     Past Surgical History:  Procedure Laterality Date   ABDOMINAL HYSTERECTOMY     APPENDECTOMY     BIOPSY  07/31/2021   Procedure: BIOPSY;  Surgeon: Otis Brace, MD;  Location: [facility];  Service: Gastroenterology;;   CHOLECYSTECTOMY     ESOPHAGOGASTRODUODENOSCOPY (EGD) WITH PROPOFOL N/A 07/31/2021   Procedure: ESOPHAGOGASTRODUODENOSCOPY (EGD) WITH PROPOFOL;  Surgeon: Otis Brace, MD;  Location: [facility];  Service: Gastroenterology;  Laterality: N/A;    Social History:  reports that she has never smoked. She has never used smokeless tobacco. She reports that she does not drink alcohol and does not use drugs.  Allergies  Allergen Reactions   Codeine Nausea And Vomiting    "Dizziness"    Family History  Problem Relation Age of Onset   Heart disease Father    Hypertension Father    Stroke Father    Diabetes Brother    Heart disease Paternal Grandmother    Stroke Paternal Grandfather      Prior to Admission medications   Medication Sig Start Date End Date Taking? Authorizing Provider  acetaminophen (TYLENOL) 500 MG tablet Take 1,000 mg by mouth every 6 (six) hours as needed for mild pain. Patient not taking: Reported on 08/13/2021    [provider]  Aspirin-Acetaminophen (GOODYS BODY PAIN PO) Take 2 packets by mouth daily as needed (pain). Patient not taking: Reported on 08/13/2021    [provider]  atorvastatin (LIPITOR) 40 MG tablet Take 40 mg by mouth every evening.    [provider]  CALCIUM-VITAMIN D PO Take 1 tablet by mouth daily.    [provider]   megestrol (MEGACE) 40 MG tablet Take 1 tablet (40 mg total) by mouth 2 (two) times daily. 08/05/21   Caren Griffins, MD  metFORMIN (GLUCOPHAGE) 500 MG tablet Take 1,000 mg by mouth in the morning and at bedtime.    [provider]  metoprolol succinate (TOPROL XL) 25 MG 24 hr tablet Take 1 tablet (25 mg total) by mouth daily. 08/05/21 08/05/22  Caren Griffins, MD  Multiple Vitamins-Minerals (CENTRUM SILVER PO) Take 1 tablet by mouth daily.    [provider]  omeprazole (PRILOSEC) 20 MG capsule Take 20 mg by mouth daily as needed (indigestion (hiatal hernia flare)).    [provider]  ondansetron (ZOFRAN-ODT) 4 MG disintegrating tablet Take 1 tablet (4 mg total) by mouth every 8 (eight) hours as needed for nausea or vomiting. Patient not taking: Reported on 08/13/2021 08/05/21   Caren Griffins, MD  prochlorperazine (COMPAZINE) 5 MG tablet Take by mouth. 08/12/21   [provider]    Physical Exam: Vitals:   08/13/21 2118 08/13/21 2136  BP:  (!) 121/93  Pulse:  100  Temp:  98.2 F (36.8 C)  TempSrc:  Oral  SpO2:  100%  Weight: 71.6 kg   Height: 5\' 3"  (1.6 m)    Constitutional: Frail elderly woman resting in bed, appears tired but otherwise in NAD, calm, comfortable Eyes: PERRL, lids and conjunctivae normal ENMT: Mucous membranes are moist. Posterior pharynx clear of any exudate or lesions.Normal dentition.  Neck: normal, supple, no masses. Respiratory: clear to auscultation bilaterally, no wheezing, no crackles. Normal respiratory effort. No accessory muscle use.  Cardiovascular: Regular rate and rhythm, no murmurs / rubs / gallops. No extremity edema. 2+ pedal pulses. Abdomen: Distended and taut abdomen without tenderness to palpation.  Bowel sounds positive.  Musculoskeletal: no clubbing / cyanosis. No joint deformity upper and lower extremities. Good ROM, no contractures. Normal muscle tone.  Skin: no rashes, lesions, ulcers. No  induration Neurologic: CN 2-12 grossly intact. Sensation intact. Strength 5/5 in all 4.  Psychiatric: Normal judgment and insight. Alert and oriented x 3. Normal mood.   Labs on Admission: I have personally reviewed following labs and imaging studies  CBC: No results for input(s): WBC, NEUTROABS, HGB, HCT, MCV, PLT in the last 168 hours. Basic Metabolic Panel: No results for input(s): NA, K, CL, CO2, GLUCOSE, BUN, CREATININE, CALCIUM, MG, PHOS in the last 168 hours. GFR: Estimated Creatinine Clearance: 53.4 mL/min (by C-G formula based on SCr of 0.81 mg/dL). Liver Function Tests: No results for input(s): AST, ALT, ALKPHOS, BILITOT, PROT, ALBUMIN in the last 168 hours. No results for input(s): LIPASE, AMYLASE in the last 168 hours. No results for input(s): AMMONIA in the last 168 hours. Coagulation Profile: No results for input(s): INR, PROTIME in the last 168 hours. Cardiac Enzymes: No results for input(s): CKTOTAL, CKMB, CKMBINDEX, TROPONINI in the last 168 hours. BNP (last 3 results) No results for input(s): PROBNP in the last 8760 hours. HbA1C: No results for input(s): HGBA1C in the last 72 hours. CBG: Recent Labs  Lab 08/13/21 2156  GLUCAP 129*   Lipid Profile:  No results for input(s): CHOL, HDL, LDLCALC, TRIG, CHOLHDL, LDLDIRECT in the last 72 hours. Thyroid Function Tests: No results for input(s): TSH, T4TOTAL, FREET4, T3FREE, THYROIDAB in the last 72 hours. Anemia Panel: No results for input(s): VITAMINB12, FOLATE, FERRITIN, TIBC, IRON, RETICCTPCT in the last 72 hours. Urine analysis:    Component Value Date/Time   COLORURINE STRAW (A) 07/26/2021 2130   APPEARANCEUR CLEAR 07/26/2021 2130   LABSPEC 1.004 (L) 07/26/2021 2130   PHURINE 7.0 07/26/2021 2130   GLUCOSEU NEGATIVE 07/26/2021 2130   HGBUR MODERATE (A) 07/26/2021 2130   BILIRUBINUR NEGATIVE 07/26/2021 2130   [place] NEGATIVE 07/26/2021 2130   PROTEINUR NEGATIVE 07/26/2021 2130   NITRITE NEGATIVE  07/26/2021 2130   LEUKOCYTESUR NEGATIVE 07/26/2021 2130    Radiological Exams on Admission: No results found.  EKG: Not performed.  Assessment/Plan Principal Problem:   Malignant ascites Active Problems:   Hypertension   Hyperlipidemia   Type 2 diabetes mellitus ([place])   Amanda Gordon is a 80 y.o. 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  Recurrent malignant ascites from adenocarcinoma of presumed upper GI or pancreaticobiliary origin: Remission per oncology.  Patient with rapid reaccumulation of abdominal ascites, last paracentesis 10/17 removed 6 L of fluid.  She is not on Lasix as it caused elevation of creatinine and hyponatremia on last admission. -US paracentesis ordered for a.m. -May consider peritoneal drain pending further goals of care  Nausea/appetite: Continue megace. Trial scheduled IV reglan and zofran prn.  Type 2 diabetes: Hold metformin. Place on SSI.  Hypertension: Holding metoprolol for now.  Hyperlipidemia: Continue atorvastatin.  DVT prophylaxis: Lovenox  Code Status: DNR, confirmed on admission.  Family Communication: Discussed with patient, she has discussed with family.  Disposition Plan: From home, dispo pending clinical progress.  Consults called: None  Level of care: Med-Surg Admission status:   Status is: Observation  The patient remains OBS appropriate and will d/c before 2 midnights.  Zada Finders MD [facility]  If 7PM-7AM, please contact night-coverage [email or web address]  08/13/2021, 11:13 PM

## 2021-08-13 NOTE — Patient Instructions (Signed)
Thank you for choosing [facility] to provide your care.   Should you have questions after your visit to the [facility] ([facility]), please contact this office at [phone] between 8:30 AM and 4:30 PM.  Voice mails left after 4:00 PM may not be returned until the following business day.  Calls received after 4:30 PM will be answered by an off-site Nurse Triage Line.    Prescription Refills:  Please have your pharmacy contact us directly for most prescription requests.  Contact the office directly for refills of narcotics (pain medications). Allow 48-72 hours for refills.  Appointments: Please contact the [facility] scheduling department [phone] for questions regarding [facility] appointment scheduling.  Contact the schedulers with any scheduling changes so that your appointment can be rescheduled in a timely manner.   Central Scheduling for [place] [phone] - Call to schedule procedures such as PET scans, CT scans, MRI, Ultrasound, etc.  To afford each patient quality time with our providers, please arrive 30 minutes before your scheduled appointment time.  If you arrive late for your appointment, you may be asked to reschedule.  We strive to give you quality time with our providers, and arriving late affects you and other patients whose appointments are after yours. If you are a no show for multiple scheduled visits, you may be dismissed from the clinic at the providers discretion.     Resources: [facility] Social Workers [phone] for additional information on assistance programs or assistance connecting with community support programs   [place] DSS  [phone]: Information regarding food stamps, Medicaid, and utility assistance [place] Access [place] [phone]   [place] [employer]'s shared-ride transportation service for eligible riders who have a disability that prevents them from riding the fixed route bus.   Medicare Rights Center 800-333-4114  Helps people with Medicare understand their rights and benefits, navigate the Medicare system, and secure the quality healthcare they deserve [employer] 800-227-2345 Assists patients locate various types of support and financial assistance Cancer Care: 1-800-813-HOPE (4673) Provides financial assistance, online support groups, medication/co-pay assistance.   Transportation Assistance for appointments at [facility]: Transportation Coordinator [phone]  Again, thank you for choosing [facility] for your care.       

## 2021-08-13 NOTE — Progress Notes (Addendum)
[facility]  HEMATOLOGY/ONCOLOGY CLINIC NOTE  Date of Service: .08/13/2021   Inpatient Attending: .Brunetta Genera, MD  HPI Ms. Trantham is a 80 year old female with a past medical history significant for type 2 diabetes mellitus, anemia, hyperlipidemia, hypertension, osteoarthritis of the left knee, obesity.  She presented to the emergency department with abdominal distention.  Recently, she has lost about 22 pounds and then regained about 17 pounds mostly due to fluid.  She has had persistent dry heaving and decreased appetite.  She is also experience constipation and had an episode of hematochezia several days prior to admission.  On admission, her WBC was 10.0, hemoglobin 14.3, platelets 4-77,000.  Sodium was 133, potassium 3.4, BUN 24, creatinine 0.76.  She had a CT of the abdomen/pelvis with contrast which showed moderate ascites, mildly enlarged retroperitoneal mesenteric lymph nodes which may be inflammatory or represent malignancy/metastatic disease, mild irregular densities in the omentum which could be inflammatory but peritoneal carcinomatosis cannot be excluded.  She underwent ultrasound-guided paracentesis on 07/24/2021 and 5 L of fluid was removed.  Fluid has been sent for cytology which is currently pending.  Tumor markers have been drawn and are currently pending including a CEA, CA 19.9, and CA125.  Transvaginal ultrasound performed 07/26/2021 showed limited visualization of a 4.2 cm right ovarian cyst which appears mildly complex with mildly thickened eccentric septation and the cystic lesion is indeterminate for malignancy particularly given the other worrisome findings seen on CT.   I met with the patient and her 2 daughters in her hospital room.  She reports ongoing abdominal discomfort.  She has had some baseline nausea but not currently vomiting.  She has been tolerating a clear liquid diet and will advance her diet today.  She is not having any headaches or dizziness.  She denies chest pain  or shortness of breath.  Bowels are moving but she has had some difficulty with constipation recently.  She denies lower extremity edema and bleeding.  The patient is widowed.  She has 3 children-2 daughters and a son.  She denies history of alcohol and tobacco use.  Family history significant for a maternal grandmother with cancer (unclear primary), sister with breast cancer, and another sister with carcinoid.  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  SUBJECTIVE:   Patient is here with her daughter for posthospitalization follow-up of her metastatic carcinoma of upper GI primary.  She was scheduled for an outpatient PET scan but has not had this yet.  Since discharge from the hospital she notes she has again developed very significant and symptomatic abdominal distention with increasing fluid from her malignant ascites.  She has been eating only minimal amounts of food and has had significant fatigue which has kept her mostly bedbound and she is needing significant help from her daughter.  Her ECOG performance status at this time is 3-4.  We discussed that she would typically be treated with a palliative regimen for metastatic carcinoma of unknown primary unless the PET scan showed a specific primary tumor.  This would typically be carboplatin plus Taxol or FOLFOX.  However advantage from palliative chemotherapy is only achieved if the person has good performance status.  With her performance status she would not be a good candidate for palliative chemotherapy at this time. The patient and her daughter are quite clear that she would not want palliative chemotherapy given how weak she is and the fact that she is unable to nutritionally  sustain herself. She is unable to function at home with her current symptoms and we decided with the patient and her daughter's consent to try to admit her directly for  symptom control and for urgent paracentesis including possible placement of a peritoneal drainage catheter to allow for symptom management at home. Patient and daughter also agreeable with referral to hospice.  We discussed that hospice can provide the supportive cares at home and can also consider transferring her for inpatient hospice if necessary based on her needs.  REVIEW OF SYSTEMS:   .10 Point review of Systems was done is negative except as noted above.   PHYSICAL EXAMINATION: . Vitals:   08/13/21 1315  BP: 110/90  Pulse: 100  Resp: 18  Temp: (!) 97.3 F (36.3 C)  SpO2: 99%   Filed Weights   .There is no height or weight on file to calculate BMI.  [facility] GENERAL: Frail and very debilitated appearing EYES: conjunctiva are pink and non-injected, sclera anicteric OROPHARYNX: MMM NECK: supple, no JVD LYMPH:  no palpable lymphadenopathy in the cervical, axillary or inguinal regions LUNGS: clear to auscultation b/l with normal respiratory effort HEART: regular rate & rhythm ABDOMEN: Distended significantly with ascites Extremity: 1+ pedal edema PSYCH: alert & oriented x 3 with fluent speech NEURO: no focal motor/sensory deficits   MEDICAL HISTORY:  Past Medical History:  Diagnosis Date   Allergy    Anemia    Complication of anesthesia    Hyperlipidemia    Hypertension    PONV (postoperative nausea and vomiting)    Primary localized osteoarthritis of left knee 07/21/2021   Type 2 diabetes mellitus ([place])     SURGICAL HISTORY: Past Surgical History:  Procedure Laterality Date   ABDOMINAL HYSTERECTOMY     APPENDECTOMY     BIOPSY  07/31/2021   Procedure: BIOPSY;  Surgeon: Otis Brace, MD;  Location: [facility];  Service: Gastroenterology;;   CHOLECYSTECTOMY     ESOPHAGOGASTRODUODENOSCOPY (EGD) WITH PROPOFOL N/A 07/31/2021   Procedure: ESOPHAGOGASTRODUODENOSCOPY (EGD) WITH PROPOFOL;  Surgeon: Otis Brace, MD;  Location: [facility];  Service:  Gastroenterology;  Laterality: N/A;    SOCIAL HISTORY: Social History   Socioeconomic History   Marital status: Married    Spouse name: Not on file   Number of children: Not on file   Years of education: Not on file   Highest education level: Not on file  Occupational History   Not on file  Tobacco Use   Smoking status: Never   Smokeless tobacco: Never  Vaping Use   Vaping Use: Never used  Substance and Sexual Activity   Alcohol use: No   Drug use: No   Sexual activity: Not on file  Other Topics Concern   Not on file  Social History Narrative   Not on file   Social Determinants of Health   Financial Resource Strain: Not on file  Food Insecurity: Not on file  Transportation Needs: Not on file  Physical Activity: Not on file  Stress: Not on file  Social Connections: Not on file  Intimate Partner Violence: Not on file    FAMILY HISTORY: Family History  Problem Relation Age of Onset   Heart disease Father    Hypertension Father    Stroke Father    Diabetes Brother    Heart disease Paternal Grandmother    Stroke Paternal Grandfather     ALLERGIES:  is allergic to codeine.  MEDICATIONS:  . Current Outpatient Medications:    omeprazole (PRILOSEC) 20 MG  capsule, Take 20 mg by mouth daily., Disp: , Rfl:    prochlorperazine (COMPAZINE) 5 MG tablet, Take 5 mg by mouth every 6 (six) hours as needed for nausea or vomiting., Disp: , Rfl:    diclofenac Sodium (VOLTAREN) 1 % GEL, Apply 4 g topically 4 (four) times daily., Disp: 350 g, Rfl: 0   dronabinol (MARINOL) 2.5 MG capsule, Take 1 capsule (2.5 mg total) by mouth 2 (two) times daily before lunch and supper., Disp: 60 capsule, Rfl: 0   ibuprofen (ADVIL) 200 MG tablet, Take 600 mg by mouth every 6 (six) hours as needed for fever, headache or mild pain., Disp: , Rfl:    morphine (ROXANOL) 20 MG/ML concentrated solution, Take 0.25-0.5 mLs (5-10 mg total) by mouth every 4 (four) hours as needed for severe pain., Disp: 240  mL, Rfl: 0   Nutritional Supplements (,FEEDING SUPPLEMENT, PROSOURCE PLUS) liquid, Take 30 mLs by mouth 3 (three) times daily between meals., Disp: 887 mL, Rfl: 0   ondansetron (ZOFRAN-ODT) 4 MG disintegrating tablet, Take 1 tablet (4 mg total) by mouth every 8 (eight) hours as needed for nausea or vomiting. (Patient not taking: No sig reported), Disp: 30 tablet, Rfl: 0   senna-docusate (SENOKOT-S) 8.6-50 MG tablet, Take 1 tablet by mouth at bedtime as needed for mild constipation., Disp: 30 tablet, Rfl: 0   traZODone (DESYREL) 50 MG tablet, Take 1 tablet (50 mg total) by mouth at bedtime as needed for sleep., Disp: 30 tablet, Rfl: 0   trolamine salicylate (ASPERCREME) 10 % cream, Apply 1 application topically as needed for muscle pain., Disp: , Rfl:    REVIEW OF SYSTEMS:    10 Point review of Systems was done is negative except as noted above.   LABORATORY DATA:  I have reviewed the data as listed  . CBC Latest Ref Rng & Units 07/30/2021 07/29/2021  WBC 4.0 - 10.5 K/uL 10.1 10.4  Hemoglobin 12.0 - 15.0 g/dL 13.4 13.5  Hematocrit 36.0 - 46.0 % 41.1 41.7  Platelets 150 - 400 K/uL 448(H) 441(H)    . CMP Latest Ref Rng & Units 07/30/2021 07/29/2021  Glucose 70 - 99 mg/dL 116(H) 137(H)  BUN 8 - 23 mg/dL 23 20  Creatinine 0.44 - 1.00 mg/dL 0.94 0.74  Sodium 135 - 145 mmol/L 137 133(L)  Potassium 3.5 - 5.1 mmol/L 4.2 4.2  Chloride 98 - 111 mmol/L 99 96(L)  CO2 22 - 32 mmol/L 27 26  Calcium 8.9 - 10.3 mg/dL 9.2 8.7(L)  Total Protein 6.5 - 8.1 g/dL 5.7(L) 5.7(L)  Total Bilirubin 0.3 - 1.2 mg/dL 0.4 0.5  Alkaline Phos 38 - 126 U/L 82 81  AST 15 - 41 U/L 18 17  ALT 0 - 44 U/L 8 8   Component     Latest Ref Rng & Units 07/26/2021  CEA     0.0 - 4.7 ng/mL 32.7 (H)  CA 19-9     0 - 35 U/mL 2,040 (H)  Cancer Antigen (CA) 125     0.0 - 38.1 U/mL 124.0 (H)    CYTOLOGY - NON PAP  CASE: [id number]  PATIENT: Ricki Madura  Non-Gynecological Cytology Report      Clinical History:  Rt ovarian cyst  Specimen Submitted:  A. ASCITES, PARACENTESIS:    FINAL MICROSCOPIC DIAGNOSIS:  - Malignant cells consistent with adenocarcinoma  - CK7 positivity, patchy CK20 positivity, and CDX2 positivity suggest  the possibility of an upper GI primary.  PAX8, WT1, S100, and Melan A  stains are negative.    RADIOGRAPHIC STUDIES: I have personally reviewed the radiological images as listed and agreed with the findings in the report.  CT CHEST W CONTRAST  Result Date: 07/29/2021 CLINICAL DATA:  Cancer of unknown primary. Staging CT scan. EXAM: CT CHEST WITH CONTRAST TECHNIQUE: Multidetector CT imaging of the chest was performed during intravenous contrast administration. CONTRAST:  67mL OMNIPAQUE IOHEXOL 350 MG/ML SOLN COMPARISON:  CT abdomen/pelvis 07/24/2021 FINDINGS: Cardiovascular: The heart is normal in size. No pericardial effusion. The aorta is normal in caliber. No dissection. Scattered atherosclerotic calcifications. The branch vessels are patent. Scattered coronary artery calcifications. Mediastinum/Nodes: No mediastinal or hilar mass or lymphadenopathy. Small scattered lymph nodes are noted. The esophagus is unremarkable. Thyroid gland is unremarkable. Lungs/Pleura: Bilateral pleural effusions, moderate on the right and mild on the left. No enhancing pleural nodules are identified. No pulmonary nodules or pulmonary lesions. Upper Abdomen: Upper abdominal ascites again demonstrated. Calcified granulomas are noted in the spleen. Musculoskeletal: No breast masses, supraclavicular or axillary adenopathy. The bony thorax is intact. Benign T8 hemangioma noted. IMPRESSION: 1. Bilateral pleural effusions, moderate on the right and mild on the left. 2. No mediastinal or hilar mass or adenopathy. 3. No pulmonary nodules or pulmonary lesions. 4. Upper abdominal ascites. Electronically Signed   By: Marijo Sanes M.D.   On: 07/29/2021 12:28   US PELVIS TRANSVAGINAL NON-OB (TV ONLY)  Result Date:  07/26/2021 CLINICAL DATA:  80 year old female inpatient with a history of hysterectomy and ascites and right adnexal cystic lesion on recent CT. EXAM: ULTRASOUND PELVIS TRANSVAGINAL TECHNIQUE: Transvaginal ultrasound examination of the pelvis was performed including evaluation of the uterus, ovaries, adnexal regions, and pelvic cul-de-sac. COMPARISON:  07/24/2021 CT abdomen/pelvis. FINDINGS: Uterus Surgically absent. Right ovary Measurements: 4.6 x 4.4 x 3.5 cm = volume: 37 mL. There is limited visualization of a 4.2 x 3.9 x 2.8 cm right ovarian cyst with questionable mildly thickened eccentric septation, and no internal vascularity on color Doppler or discrete mural nodularity. Left ovary Nonvisualization of the left ovary.  No left adnexal masses. Other findings:  No abnormal free fluid IMPRESSION: 1. Limited visualization of a 4.2 cm right ovarian cyst, which appears mildly complex with mildly thickened eccentric septation. This cystic lesion is indeterminate for malignancy particularly given the other worrisome findings of large volume ascites and possible omental caking on the recent CT. 2. No abnormal findings at the hysterectomy margin. 3. Nonvisualization of the left ovary.  No left adnexal masses. Electronically Signed   By: Ilona Sorrel M.D.   On: 07/26/2021 12:59   CT Abdomen Pelvis W Contrast  Result Date: 07/24/2021 CLINICAL DATA:  Abdominal distension, nausea. EXAM: CT ABDOMEN AND PELVIS WITH CONTRAST TECHNIQUE: Multidetector CT imaging of the abdomen and pelvis was performed using the standard protocol following bolus administration of intravenous contrast. CONTRAST:  42mL OMNIPAQUE IOHEXOL 350 MG/ML SOLN COMPARISON:  None. FINDINGS: Lower chest: Small bilateral pleural effusions are noted with adjacent subsegmental atelectasis. Hepatobiliary: No focal liver abnormality is seen. Status post cholecystectomy. No biliary dilatation. Pancreas: Unremarkable. No pancreatic ductal dilatation or  surrounding inflammatory changes. Spleen: Calcified splenic granulomata are noted. Adrenals/Urinary Tract: Adrenal glands are unremarkable. Kidneys are normal, without renal calculi, focal lesion, or hydronephrosis. Bladder is unremarkable. Stomach/Bowel: Stomach appears normal. There is no evidence of bowel obstruction or inflammation. Status post appendectomy. Sigmoid diverticulosis is noted without inflammation. Vascular/Lymphatic: Aortic atherosclerosis. Mildly enlarged retroperitoneal and mesenteric lymph nodes are noted which may be inflammatory in etiology, but malignancy  or metastatic disease cannot be excluded. Largest lymph node measures 13 mm in right periaortic region. Reproductive: Status post hysterectomy. 3.8 cm right ovarian cystic abnormality is noted. No definite left adnexal abnormality is noted. Other: Moderate ascites is noted. Mild irregular densities are seen involving the omentum concerning for possible peritoneal carcinomatosis. Musculoskeletal: No acute or significant osseous findings. IMPRESSION: Moderate ascites is noted. Mildly enlarged retroperitoneal mesenteric lymph nodes are noted, the largest measuring 13 mm in the right periaortic region. While these may be inflammatory in etiology, malignancy or metastatic disease cannot be excluded. Mild irregular densities are seen involving the omentum which may represent inflammation, but peritoneal carcinomatosis cannot be excluded. Status post hysterectomy. 3.8 cm right ovarian cyst is noted. Recommend follow-up US in 6-12 months. Note: This recommendation does not apply to premenarchal patients and to those with increased risk (genetic, family history, elevated tumor markers or other high-risk factors) of ovarian cancer. Reference: JACR 2020 Feb; 17(2):248-254. Sigmoid diverticulosis without inflammation. Small bilateral pleural effusions with minimal adjacent subsegmental atelectasis. Aortic Atherosclerosis (ICD10-I70.0). Electronically  Signed   By: Marijo Conception M.D.   On: 07/24/2021 15:43   MR 3D Recon At Scanner  Result Date: 07/31/2021 CLINICAL DATA:  Adenocarcinoma of unknown primary, upper GI primary favored. EXAM: MRI ABDOMEN WITHOUT AND WITH CONTRAST (INCLUDING MRCP) TECHNIQUE: Multiplanar multisequence MR imaging of the abdomen was performed both before and after the administration of intravenous contrast. Heavily T2-weighted images of the biliary and pancreatic ducts were obtained, and three-dimensional MRCP images were rendered by post processing. CONTRAST:  77mL GADAVIST GADOBUTROL 1 MMOL/ML IV SOLN COMPARISON:  CT abdomen/pelvis dated 07/24/2021 FINDINGS: Extremely motion degraded images leading to markedly limited evaluation. Lower chest: Moderate right and small left pleural effusions. Associated lower lobe atelectasis. Hepatobiliary: No focal hepatic lesion is seen. Status post cholecystectomy. No intrahepatic or extrahepatic ductal dilatation. Pancreas:  No parenchymal atrophy or ductal dilatation. Spleen:  Grossly unremarkable. Adrenals/Urinary Tract:  Adrenal glands are within normal limits. Kidneys are grossly unremarkable.  No hydronephrosis. Stomach/Bowel: Mild wall thickening in the region of the gastric antrum/duodenal bulb, poorly evaluated. The visualized bowel is grossly unremarkable. Vascular/Lymphatic:  No evidence of abdominal aortic aneurysm. Small upper abdominal/retroperitoneal lymph nodes, better evaluated on CT. 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 Musculoskeletal: No focal osseous lesions. IMPRESSION: Markedly limited evaluation due to motion degradation. 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 Small upper abdominal/retroperitoneal lymph nodes, better evaluated on CT. Mild wall thickening in the region of the gastric antrum/duodenal bulb, poorly evaluated. Correlate with recent endoscopy. Moderate right and small left pleural effusions.  Associated lower lobe atelectasis. Electronically Signed   By: Julian Hy M.D.   On: 07/31/2021 21:21   US Paracentesis  Result Date: 08/03/2021 INDICATION: History of metastatic cancer with malignant ascites. Request for therapeutic paracentesis EXAM: ULTRASOUND GUIDED THERAPEUTIC PARACENTESIS MEDICATIONS: 10 mL 1% lidocaine COMPLICATIONS: None immediate. PROCEDURE: Informed written consent was obtained from the patient after a discussion of the risks, benefits and alternatives to treatment. A timeout was performed prior to the initiation of the procedure. Initial ultrasound scanning demonstrates a large amount of ascites within the left lower abdominal quadrant. The left lower abdomen was prepped and draped in the usual sterile fashion. 1% lidocaine was used for local anesthesia. Following this, a 6 Fr Safe-T-Centesis catheter was introduced. An ultrasound image was saved for documentation purposes. The paracentesis was performed. The catheter was removed and a dressing was applied. The patient  tolerated the procedure well without immediate post procedural complication. FINDINGS: A total of approximately 6 L of clear, yellow fluid was removed. IMPRESSION: Successful ultrasound-guided paracentesis yielding 6 liters of peritoneal fluid. Read by: Narda Rutherford, NP Electronically Signed   By: Michaelle Birks M.D.   On: 08/03/2021 15:35   US Paracentesis  Result Date: 07/25/2021 INDICATION: Concern for intra-abdominal malignancy, now with symptomatic ascites. Please perform ultrasound-guided paracentesis for diagnostic and therapeutic purposes. Max paracentesis volume of 5 L requested. EXAM: ULTRASOUND GUIDED DIAGNOSTIC AND THERAPEUTIC PARACENTESIS COMPARISON:  CT abdomen and pelvis-earlier same day MEDICATIONS: None. COMPLICATIONS: None immediate. TECHNIQUE: Informed written consent was obtained from the patient after a discussion of the risks, benefits and alternatives to treatment. A timeout was performed  prior to the initiation of the procedure. Initial ultrasound scanning demonstrates a large amount of ascites within the left lower abdomen which was subsequently prepped and draped in the usual sterile fashion. 1% lidocaine with epinephrine was used for local anesthesia. An ultrasound image was saved for documentation purposed. An 8 Fr Safe-T-Centesis catheter was introduced. The paracentesis was performed. The catheter was removed and a dressing was applied. The patient tolerated the procedure well without immediate post procedural complication. FINDINGS: A total of approximately 5 liters of serous fluid was removed. Samples were sent to the laboratory as requested by the clinical team. IMPRESSION: Successful ultrasound-guided paracentesis yielding 5 liters of peritoneal fluid. Electronically Signed   By: Sandi Mariscal M.D.   On: 07/25/2021 08:18   MR ABDOMEN MRCP W WO CONTAST  Result Date: 07/31/2021 CLINICAL DATA:  Adenocarcinoma of unknown primary, upper GI primary favored. EXAM: MRI ABDOMEN WITHOUT AND WITH CONTRAST (INCLUDING MRCP) TECHNIQUE: Multiplanar multisequence MR imaging of the abdomen was performed both before and after the administration of intravenous contrast. Heavily T2-weighted images of the biliary and pancreatic ducts were obtained, and three-dimensional MRCP images were rendered by post processing. CONTRAST:  3mL GADAVIST GADOBUTROL 1 MMOL/ML IV SOLN COMPARISON:  CT abdomen/pelvis dated 07/24/2021 FINDINGS: Extremely motion degraded images leading to markedly limited evaluation. Lower chest: Moderate right and small left pleural effusions. Associated lower lobe atelectasis. Hepatobiliary: No focal hepatic lesion is seen. Status post cholecystectomy. No intrahepatic or extrahepatic ductal dilatation. Pancreas:  No parenchymal atrophy or ductal dilatation. Spleen:  Grossly unremarkable. Adrenals/Urinary Tract:  Adrenal glands are within normal limits. Kidneys are grossly unremarkable.  No  hydronephrosis. Stomach/Bowel: Mild wall thickening in the region of the gastric antrum/duodenal bulb, poorly evaluated. The visualized bowel is grossly unremarkable. Vascular/Lymphatic:  No evidence of abdominal aortic aneurysm. Small upper abdominal/retroperitoneal lymph nodes, better evaluated on CT. 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 Musculoskeletal: No focal osseous lesions. IMPRESSION: Markedly limited evaluation due to motion degradation. 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 Small upper abdominal/retroperitoneal lymph nodes, better evaluated on CT. Mild wall thickening in the region of the gastric antrum/duodenal bulb, poorly evaluated. Correlate with recent endoscopy. Moderate right and small left pleural effusions. Associated lower lobe atelectasis. Electronically Signed   By: Julian Hy M.D.   On: 07/31/2021 21:21   [place] ASCITES (ABDOMEN LIMITED)  Result Date: 07/27/2021 CLINICAL DATA:  Patient is complaining of leaking at previous paracentesis puncture site. EXAM: LIMITED ABDOMEN ULTRASOUND FOR ASCITES TECHNIQUE: Limited ultrasound survey for ascites was performed in all four abdominal quadrants. COMPARISON:  07/24/2021 FINDINGS: Small to moderate amount of ascites in the right upper quadrant and right lower quadrant. Minimal ascites in left upper quadrant. No significant  ascites in left lower quadrant. IMPRESSION: Small to moderate amount of ascites in the right abdomen. Paracentesis not performed. Electronically Signed   By: Markus Daft M.D.   On: 07/27/2021 13:32    ASSESSMENT & PLAN:    80 year old female with  1.  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  2.retroperitoneal lymphadenopathy and possible peritoneal carcinomatosis -07/24/2021 CT of the abdomen/pelvis with contrast- "Moderate ascites is noted. Mildly enlarged  retroperitoneal mesenteric lymph nodes are noted, the largest measuring 13 mm in the right periaortic region. While these may be inflammatory in etiology, malignancy or metastatic disease cannot be excluded. Mild irregular densities are seen involving the omentum which may represent inflammation, but peritoneal carcinomatosis cannot be excluded. Status post hysterectomy. 3.8 cm right ovarian cyst is noted. Recommend an EGD tomorrow [place] in 6-12 months. Note: This recommendation does not apply to premenarchal patients and to those with increased risk (genetic, family history, elevated tumor markers or other high-risk factors) of ovarian cancer. Reference: JACR 2020 Feb; 17(2):248-254. Sigmoid diverticulosis without inflammation. Small bilateral pleural effusions with minimal adjacent subsegmental atelectasis. Aortic Atherosclerosis (ICD10-I70.0)." -07/26/2021 transvaginal ultrasound- "1. Limited visualization of a 4.2 cm right ovarian cyst, which appears mildly complex with mildly thickened eccentric septation. This cystic lesion is indeterminate for malignancy particularly given the other worrisome findings of large volume ascites and possible omental caking on the recent CT. 2. No abnormal findings at the hysterectomy margin.3. Nonvisualization of the left ovary.  No left adnexal masses." -07/31/2021 ERCP-gastritis which was biopsied, mucosal changes in the duodenum which was biopsied.  Biopsies negative for malignancy. -07/31/2021 MRCP- "Markedly limited evaluation due to motion degradation. 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  Small upper abdominal/retroperitoneal lymph nodes, better evaluated on CT. Mild wall thickening in the region of the gastric antrum/duodenal bulb, poorly evaluated. Correlate with recent endoscopy. Moderate right and small left pleural effusions. Associated lower lobe atelectasis." The cytology from the ascitic fluid on admission most consistent with upper GI primary,  but MRCP and EGD did not identify a definitive mass and biopsies were negative. 3.  Protein calorie malnutrition   4.  Hypertension   5.  Hyperlipidemia   6.  Type II diabetes mellitus   PLAN -Patient today is significantly more debilitated with ECOG performance status of 3-4 . -He had extensive goals of care discussion. -Patient and her daughter prefer not to consider palliative chemotherapy which is the most appropriate decision given she is unlikely to tolerate this well based on her nutritional and performance status . -Patient is very symptomatic with recurrent ascites and we shall try to direct admit her to the hospitalist service for urgent paracentesis and possible peritoneal drain placement for comfort. -Patient would like to pursue best supportive cares through hospice. -We shall refer her chemotherapy or hospice with her consent.  Follow-up Discussed with Dr. Marylyn Ishihara hospitalist for direct admission for symptom control and transition to hospice cares and indwelling peritoneal catheter placement for comfort from worsening malignant ascites.  . The total time spent in the appointment was 41 minutes and more than 50% was on counseling and direct patient cares, coordinating care with inpatient hospitalist and referral to hospice.  [name] MD MS

## 2021-08-14 ENCOUNTER — Observation Stay (HOSPITAL_COMMUNITY): Payer: Medicare Other

## 2021-08-14 ENCOUNTER — Other Ambulatory Visit: Payer: Self-pay

## 2021-08-14 DIAGNOSIS — R180 Malignant ascites: Secondary | ICD-10-CM | POA: Diagnosis not present

## 2021-08-14 DIAGNOSIS — Z7189 Other specified counseling: Secondary | ICD-10-CM | POA: Diagnosis not present

## 2021-08-14 DIAGNOSIS — C786 Secondary malignant neoplasm of retroperitoneum and peritoneum: Secondary | ICD-10-CM

## 2021-08-14 DIAGNOSIS — C801 Malignant (primary) neoplasm, unspecified: Secondary | ICD-10-CM | POA: Diagnosis not present

## 2021-08-14 LAB — CBC
HCT: 43.8 % (ref 36.0–46.0)
Hemoglobin: 15 g/dL (ref 12.0–15.0)
MCH: 27.7 pg (ref 26.0–34.0)
MCHC: 34.2 g/dL (ref 30.0–36.0)
MCV: 81 fL (ref 80.0–100.0)
Platelets: 572 10*3/uL — ABNORMAL HIGH (ref 150–400)
RBC: 5.41 MIL/uL — ABNORMAL HIGH (ref 3.87–5.11)
RDW: 16.2 % — ABNORMAL HIGH (ref 11.5–15.5)
WBC: 13.6 10*3/uL — ABNORMAL HIGH (ref 4.0–10.5)
nRBC: 0 % (ref 0.0–0.2)

## 2021-08-14 LAB — COMPREHENSIVE METABOLIC PANEL
ALT: 12 U/L (ref 0–44)
AST: 20 U/L (ref 15–41)
Albumin: 2.7 g/dL — ABNORMAL LOW (ref 3.5–5.0)
Alkaline Phosphatase: 82 U/L (ref 38–126)
Anion gap: 17 — ABNORMAL HIGH (ref 5–15)
BUN: 58 mg/dL — ABNORMAL HIGH (ref 8–23)
CO2: 14 mmol/L — ABNORMAL LOW (ref 22–32)
Calcium: 8.1 mg/dL — ABNORMAL LOW (ref 8.9–10.3)
Chloride: 91 mmol/L — ABNORMAL LOW (ref 98–111)
Creatinine, Ser: 3.26 mg/dL — ABNORMAL HIGH (ref 0.44–1.00)
GFR, Estimated: 14 mL/min — ABNORMAL LOW (ref >60)
Glucose, Bld: 100 mg/dL — ABNORMAL HIGH (ref 70–99)
Potassium: 5.1 mmol/L (ref 3.5–5.1)
Sodium: 122 mmol/L — ABNORMAL LOW (ref 135–145)
Total Bilirubin: 0.7 mg/dL (ref 0.3–1.2)
Total Protein: 6.4 g/dL — ABNORMAL LOW (ref 6.5–8.1)

## 2021-08-14 LAB — GLUCOSE, CAPILLARY
Glucose-Capillary: 140 mg/dL — ABNORMAL HIGH (ref 70–99)
Glucose-Capillary: 151 mg/dL — ABNORMAL HIGH (ref 70–99)
Glucose-Capillary: 118 mg/dL — ABNORMAL HIGH (ref 70–99)
Glucose-Capillary: 135 mg/dL — ABNORMAL HIGH (ref 70–99)

## 2021-08-14 LAB — MAGNESIUM: Magnesium: 2.4 mg/dL (ref 1.7–2.4)

## 2021-08-14 LAB — PROTIME-INR
INR: 1.2 (ref 0.8–1.2)
Prothrombin Time: 14.9 seconds (ref 11.4–15.2)

## 2021-08-14 LAB — RESP PANEL BY RT-PCR (FLU A&B, COVID) ARPGX2
Influenza A by PCR: NEGATIVE
Influenza B by PCR: NEGATIVE
SARS Coronavirus 2 by RT PCR: NEGATIVE

## 2021-08-14 MED ORDER — RESOURCE INSTANT PROTEIN PO PWD PACKET
1.0000 | Freq: Three times a day (TID) | ORAL | Status: DC
  Administered 2021-08-15 – 2021-08-18 (×8): 6 g via ORAL
  Filled 2021-08-14 (×13): qty 6

## 2021-08-14 MED ORDER — ENSURE ENLIVE PO LIQD
237.0000 mL | Freq: Two times a day (BID) | ORAL | Status: DC
  Administered 2021-08-14 (×2): 237 mL via ORAL

## 2021-08-14 MED ORDER — ALBUMIN HUMAN 25 % IV SOLN
25.0000 g | Freq: Once | INTRAVENOUS | Status: AC
  Administered 2021-08-14: 25 g via INTRAVENOUS
  Filled 2021-08-14: qty 100

## 2021-08-14 MED ORDER — ENOXAPARIN SODIUM 30 MG/0.3ML IJ SOSY
30.0000 mg | PREFILLED_SYRINGE | INTRAMUSCULAR | Status: DC
  Administered 2021-08-14 – 2021-08-16 (×3): 30 mg via SUBCUTANEOUS
  Filled 2021-08-14 (×3): qty 0.3

## 2021-08-14 MED ORDER — ADULT MULTIVITAMIN W/MINERALS CH
1.0000 | ORAL_TABLET | Freq: Every day | ORAL | Status: DC
  Administered 2021-08-15 – 2021-08-17 (×3): 1 via ORAL
  Filled 2021-08-14 (×4): qty 1

## 2021-08-14 MED ORDER — ALBUMIN HUMAN 5 % IV SOLN: 25.0000 g | Freq: Once | INTRAVENOUS | Status: DC

## 2021-08-14 MED ORDER — LIDOCAINE HCL 1 % IJ SOLN
INTRAMUSCULAR | Status: AC
  Filled 2021-08-14: qty 20

## 2021-08-14 MED ORDER — DRONABINOL 2.5 MG PO CAPS
2.5000 mg | ORAL_CAPSULE | Freq: Two times a day (BID) | ORAL | Status: DC
  Administered 2021-08-14 – 2021-08-18 (×8): 2.5 mg via ORAL
  Filled 2021-08-14 (×8): qty 1

## 2021-08-14 MED ORDER — PROSOURCE PLUS PO LIQD
30.0000 mL | Freq: Three times a day (TID) | ORAL | Status: DC
  Administered 2021-08-14 – 2021-08-18 (×7): 30 mL via ORAL
  Filled 2021-08-14 (×6): qty 30

## 2021-08-14 MED ORDER — ALBUMIN HUMAN 25 % IV SOLN
25.0000 g | Freq: Once | INTRAVENOUS | Status: DC
  Filled 2021-08-14: qty 100

## 2021-08-14 NOTE — Progress Notes (Signed)
b

## 2021-08-14 NOTE — Progress Notes (Signed)
Initial Nutrition Assessment  DOCUMENTATION CODES:   Non-severe (moderate) malnutrition in context of chronic illness  INTERVENTION:   Once diet advanced: -Beneprotein powder TID with applesauce, 75 kcals and 6g protein -Prosource Plus PO TID, each provides 100 kcals and 15g protein -Multivitamin with minerals daily  -Daily snack  NUTRITION DIAGNOSIS:   Moderate Malnutrition related to chronic illness, cancer and cancer related treatments as evidenced by energy intake < or equal to 75% for > or equal to 1 month, moderate fat depletion, moderate muscle depletion.  GOAL:   Patient will meet greater than or equal to 90% of their needs  MONITOR:   PO intake, Supplement acceptance, Labs, Weight trends, Diet advancement, I & O's  REASON FOR ASSESSMENT:   Malnutrition Screening Tool    ASSESSMENT:   80 y.o. 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  Patient in room with daughter at bedside. Pt awaiting paracentesis, visibly uncomfortable during visit. Pt continues to be NPO. Has had continued nausea and poor appetite r/t recurrent ascites. Not able to tolerate large volumes and only small amounts of food. Does not like Ensure and states Boost Breeze is too acidic. Will order Prosource and Beneprotein supplements. Pt would like cottage cheese as a snack as well.   Per weight records, no weight loss noted.  Medications: Marinol, IV Reglan  Labs reviewed:  CBGs: 129-151 Low Na  NUTRITION - FOCUSED PHYSICAL EXAM:  Flowsheet Row Most Recent Value  Orbital Region Severe depletion  Upper Arm Region Moderate depletion  Thoracic and Lumbar Region Unable to assess  [ascites]  Buccal Region Moderate depletion  Temple Region Moderate depletion  Clavicle Bone Region Mild depletion  Clavicle and  Acromion Bone Region Mild depletion  Scapular Bone Region Unable to assess  Dorsal Hand Moderate depletion  Patellar Region Unable to assess  Anterior Thigh Region Unable to assess  Posterior Calf Region Unable to assess  Hair Reviewed  Eyes Reviewed  Mouth Reviewed  Skin Reviewed       Diet Order:   Diet Order             Diet NPO time specified  Diet effective now                   EDUCATION NEEDS:   Education needs have been addressed  Skin:  Skin Assessment: Reviewed RN Assessment  Last BM:  10/28  Height:   Ht Readings from Last 1 Encounters:  08/13/21 5\' 3"  (1.6 m)    Weight:   Wt Readings from Last 1 Encounters:  08/14/21 74 kg    BMI:  Body mass index is 28.9 kg/m.  Estimated Nutritional Needs:   Kcal:  1600-1800  Protein:  85-95g  Fluid:  1.7L/day  Clayton Bibles, MS, RD, LDN Inpatient Clinical Dietitian Contact information available via Amion

## 2021-08-14 NOTE — Plan of Care (Signed)
  Problem: Pain Managment: Goal: General experience of comfort will improve Outcome: Progressing   

## 2021-08-14 NOTE — Procedures (Signed)
PROCEDURE SUMMARY:  Successful US guided therapeutic paracentesis from LLQ.  Yielded 6.8 L of cloudy, yellow fluid.  No immediate complications.  Pt tolerated well.   Specimen not sent for labs.  EBL < 1 mL  Tyson Alias, AGNP 08/14/2021 4:52 PM

## 2021-08-14 NOTE — Progress Notes (Addendum)
PROGRESS NOTE    Channah Budzinski Foley   WUJ:[id number]  DOB: 1941-07-02  DOA: 08/13/2021 PCP: Elias Else, MD   Brief Narrative:  Amanda Gordon is a 80 y.o. 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  Patient recently admitted 07/24/2021-08/05/2021 for newly diagnosed malignant ascites.  Patient underwent paracentesis on 10/10 and 10/17.  Cytology and immunohistochemical markers were suggestive of upper GI source.  GI were consulted and patient underwent EGD on 10/14 which was unrevealing.  MRCP done 10/14 showed mild wall thickening in the region of the gastric antrum/duodenal bulb, poorly evaluated.  Biopsies from EGD were negative.  Of note, patient was last discharged on Lasix as it was causing mild creatinine elevation and hyponatremia while in hospital.  Subjective: She feels tired today. No pain. No appetite.     Assessment & Plan:   Principal Problem:   Malignant ascites - plan for third paracentesis - also plan for abdominal drain next week- IR will give an appointment to the patient - will give 50 grams of Albumin after the paracentesis to prevent re-accumulation of fluid and hypotension  Active Problems: Adenocarcinoma of presumed upper GI or pancreaticobiliary origin   Goals of care, counseling/discussion - patient, daughter and oncology have decided to pursue hospice- we will see if she can go home with hospice- if not, we will pursue a hospice home- having ongoing discussion with patient, her daughter Tillman Abide and Dr Candise Che    Poor oral intake - in setting of cancer- on Marinol  AKI -due to poor oral intake and third spacing of fluid - Cr baseline is 0.81- Cr yesterday was 3.26     Time spent in minutes: 45 DVT prophylaxis: enoxaparin (LOVENOX) injection 30 mg Start:  08/14/21 2200 Code Status: DNR Family Communication: daughter at bedside Level of Care: Level of care: Med-Surg Disposition Plan:  Status is: Observation  The patient remains OBS appropriate and will d/c before 2 midnights.  Consultants:  Oncology  Procedures:  paracentesis Antimicrobials:  Anti-infectives (From admission, onward)    None        Objective: Vitals:   08/14/21 1627 08/14/21 1649 08/14/21 1653 08/14/21 1745  BP: 111/88 108/82 105/74 102/77  Pulse:    (!) 108  Resp:      Temp:    97.6 F (36.4 C)  TempSrc:    Oral  SpO2: 95% 100% 100%   Weight:      Height:        Intake/Output Summary (Last 24 hours) at 08/14/2021 1758 Last data filed at 08/14/2021 0345 Gross per 24 hour  Intake 200 ml  Output 100 ml  Net 100 ml   Filed Weights   08/13/21 2118 08/14/21 0624  Weight: 71.6 kg 74 kg    Examination: General exam: Appears comfortable  HEENT: PERRLA, oral mucosa moist, no sclera icterus or thrush Respiratory system: Clear to auscultation. Respiratory effort normal. Cardiovascular system: S1 & S2 heard, RRR.   Gastrointestinal system: Abdomen soft, non-tender,  significantly distended abdomen, Normal bowel sounds. Central nervous system: Alert and oriented. No focal neurological deficits. Extremities: No cyanosis, clubbing or edema Skin: No rashes or ulcers Psychiatry:  Mood & affect appropriate.     Data Reviewed: I have personally reviewed following labs and imaging studies  CBC: Recent Labs  Lab 08/13/21 2350  WBC 13.6*  HGB 15.0  HCT 43.8  MCV 81.0  PLT 572*   Basic Metabolic Panel: Recent Labs  Lab 08/13/21 2350  NA 122*  K 5.1  CL 91*  CO2 14*  GLUCOSE 100*  BUN 58*  CREATININE 3.26*  CALCIUM 8.1*  MG 2.4   GFR: Estimated Creatinine Clearance: 13.5 mL/min (A) (by C-G formula based on SCr of 3.26 mg/dL (H)). Liver Function Tests: Recent Labs  Lab 08/13/21 2350  AST 20  ALT 12  ALKPHOS 82  BILITOT 0.7  PROT  6.4*  ALBUMIN 2.7*   No results for input(s): LIPASE, AMYLASE in the last 168 hours. No results for input(s): AMMONIA in the last 168 hours. Coagulation Profile: Recent Labs  Lab 08/13/21 2350  INR 1.2   Cardiac Enzymes: No results for input(s): CKTOTAL, CKMB, CKMBINDEX, TROPONINI in the last 168 hours. BNP (last 3 results) No results for input(s): PROBNP in the last 8760 hours. HbA1C: No results for input(s): HGBA1C in the last 72 hours. CBG: Recent Labs  Lab 08/13/21 2156 08/14/21 0742 08/14/21 1206 08/14/21 1742  GLUCAP 129* 140* 151* 118*   Lipid Profile: No results for input(s): CHOL, HDL, LDLCALC, TRIG, CHOLHDL, LDLDIRECT in the last 72 hours. Thyroid Function Tests: No results for input(s): TSH, T4TOTAL, FREET4, T3FREE, THYROIDAB in the last 72 hours. Anemia Panel: No results for input(s): VITAMINB12, FOLATE, FERRITIN, TIBC, IRON, RETICCTPCT in the last 72 hours. Urine analysis:    Component Value Date/Time   COLORURINE STRAW (A) 07/26/2021 2130   APPEARANCEUR CLEAR 07/26/2021 2130   LABSPEC 1.004 (L) 07/26/2021 2130   PHURINE 7.0 07/26/2021 2130   GLUCOSEU NEGATIVE 07/26/2021 2130   HGBUR MODERATE (A) 07/26/2021 2130   BILIRUBINUR NEGATIVE 07/26/2021 2130   KETONESUR NEGATIVE 07/26/2021 2130   PROTEINUR NEGATIVE 07/26/2021 2130   NITRITE NEGATIVE 07/26/2021 2130   LEUKOCYTESUR NEGATIVE 07/26/2021 2130   Sepsis Labs: @LABRCNTIP (procalcitonin:4,lacticidven:4) ) Recent Results (from the past 240 hour(s))  Resp Panel by RT-PCR (Flu A&B, Covid) Nasopharyngeal Swab     Status: None   Collection Time: 08/13/21 12:18 AM   Specimen: Nasopharyngeal Swab; Nasopharyngeal(NP) swabs in vial transport medium  Result Value Ref Range Status   SARS Coronavirus 2 by RT PCR NEGATIVE NEGATIVE Final    Comment: (NOTE) SARS-CoV-2 target nucleic acids are NOT DETECTED.  The SARS-CoV-2 RNA is generally detectable in upper respiratory specimens during the acute phase of  infection. The lowest concentration of SARS-CoV-2 viral copies this assay can detect is 138 copies/mL. A negative result does not preclude SARS-Cov-2 infection and should not be used as the sole basis for treatment or other patient management decisions. A negative result may occur with  improper specimen collection/handling, submission of specimen other than nasopharyngeal swab, presence of viral mutation(s) within the areas targeted by this assay, and inadequate number of viral copies(<138 copies/mL). A negative result must be combined with clinical observations, patient history, and epidemiological information. The expected result is Negative.  Fact Sheet for Patients:  BloggerCourse.com  Fact Sheet for Healthcare Providers:  SeriousBroker.it  This test is no t yet approved or cleared by the [place] FDA and  has been authorized for detection and/or diagnosis of SARS-CoV-2 by FDA under an Emergency Use Authorization (EUA). This EUA will remain  in effect (meaning this test can be used) for the duration of the COVID-19 declaration under Section 564(b)(1) of the Act, 21 U.S.C.section 360bbb-3(b)(1), unless the authorization is terminated  or revoked sooner.       Influenza A by  PCR NEGATIVE NEGATIVE Final   Influenza B by PCR NEGATIVE NEGATIVE Final    Comment: (NOTE) The Xpert Xpress SARS-CoV-2/FLU/RSV plus assay is intended as an aid in the diagnosis of influenza from Nasopharyngeal swab specimens and should not be used as a sole basis for treatment. Nasal washings and aspirates are unacceptable for Xpert Xpress SARS-CoV-2/FLU/RSV testing.  Fact Sheet for Patients: BloggerCourse.com  Fact Sheet for Healthcare Providers: SeriousBroker.it  This test is not yet approved or cleared by the [place] FDA and has been authorized for detection and/or diagnosis of SARS-CoV-2  by FDA under an Emergency Use Authorization (EUA). This EUA will remain in effect (meaning this test can be used) for the duration of the COVID-19 declaration under Section 564(b)(1) of the Act, 21 U.S.C. section 360bbb-3(b)(1), unless the authorization is terminated or revoked.  Performed at [facility] [address] [place], [place] [address]          Radiology Studies: No results found.    Scheduled Meds:  (feeding supplement) PROSource Plus  30 mL Oral TID BM   atorvastatin  40 mg Oral QPM   dronabinol  2.5 mg Oral BID AC   enoxaparin (LOVENOX) injection  30 mg Subcutaneous Q24H   influenza vaccine adjuvanted  0.5 mL Intramuscular Tomorrow-1000   insulin aspart  0-9 Units Subcutaneous TID WC   metoCLOPramide (REGLAN) injection  10 mg Intravenous Q8H   [START ON 08/15/2021] multivitamin with minerals  1 tablet Oral Daily   pantoprazole  40 mg Oral Daily   [START ON 08/15/2021] protein supplement  1 Scoop Oral TID WC   Continuous Infusions:  albumin human       LOS: 1 day      Calvert Cantor, MD [facility] Pager: [email or web address] 08/14/2021, 5:58 PM

## 2021-08-14 NOTE — Progress Notes (Addendum)
HEMATOLOGY-ONCOLOGY PROGRESS NOTE  SUBJECTIVE: Amanda Gordon was seen for hospital follow-up in our office yesterday.  She had worsening fatigue and ascites that is rapidly accumulating.  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  The decision was made to admit her to the hospital for paracentesis for comfort and also placement of a peritoneal drain.  Ultrasound-guided paracentesis has been ordered but not yet performed today.  The patient is extremely fatigued.  She has abdominal distention and discomfort.  She has no nausea or vomiting.  REVIEW OF SYSTEMS:   A comprehensive 10 point review of systems is negative except as noted in the HPI.  PHYSICAL EXAMINATION: ECOG PERFORMANCE STATUS: 4 - Bedbound  Vitals:   08/14/21 0549 08/14/21 1057  BP: 117/88 103/82  Pulse: 95 (!) 101  Resp: 14 18  Temp:  97.6 F (36.4 C)  SpO2: 96% 97%   Filed Weights   08/13/21 2118 08/14/21 0624  Weight: 71.6 kg 74 kg    Intake/Output from previous day: 10/27 0701 - 10/28 0700 In: 200 [P.O.:200] Out: 100 [Urine:100]  GENERAL: Chronically ill-appearing, fatigued SKIN: Skin is pale, no rashes EYES: normal, Conjunctiva are pink and non-injected, sclera clear OROPHARYNX:no exudate, no erythema and lips, buccal mucosa, and tongue normal  LUNGS: clear to auscultation and percussion with normal breathing effort HEART: regular rate & rhythm and no murmurs and no lower extremity edema ABDOMEN: Abdomen distended, ascites present NEURO: alert & oriented x 3 with fluent speech, no focal motor/sensory deficits  LABORATORY DATA:  I have reviewed the data as listed CMP Latest Ref Rng & Units 08/13/2021 08/04/2021 08/03/2021  Glucose 70 - 99 mg/dL 100(H) 141(H) 138(H)  BUN 8 - 23 mg/dL 58(H) 23 24(H)  Creatinine 0.44 - 1.00 mg/dL 3.26(H) 0.81 0.91  Sodium 135 - 145 mmol/L 122(L) 128(L) 126(L)  Potassium 3.5 - 5.1 mmol/L 5.1 3.9 3.9   Chloride 98 - 111 mmol/L 91(L) 94(L) 92(L)  CO2 22 - 32 mmol/L 14(L) 22 22  Calcium 8.9 - 10.3 mg/dL 8.1(L) 8.3(L) 8.6(L)  Total Protein 6.5 - 8.1 g/dL 6.4(L) - -  Total Bilirubin 0.3 - 1.2 mg/dL 0.7 - -  Alkaline Phos 38 - 126 U/L 82 - -  AST 15 - 41 U/L 20 - -  ALT 0 - 44 U/L 12 - -    Lab Results  Component Value Date   WBC 13.6 (H) 08/13/2021   HGB 15.0 08/13/2021   HCT 43.8 08/13/2021   MCV 81.0 08/13/2021   PLT 572 (H) 08/13/2021   NEUTROABS 8.2 (H) 07/24/2021    CT CHEST W CONTRAST  Result Date: 07/29/2021 CLINICAL DATA:  Cancer of unknown primary. Staging CT scan. EXAM: CT CHEST WITH CONTRAST TECHNIQUE: Multidetector CT imaging of the chest was performed during intravenous contrast administration. CONTRAST:  80mL OMNIPAQUE IOHEXOL 350 MG/ML SOLN COMPARISON:  CT abdomen/pelvis 07/24/2021 FINDINGS: Cardiovascular: The heart is normal in size. No pericardial effusion. The aorta is normal in caliber. No dissection. Scattered atherosclerotic calcifications. The branch vessels are patent. Scattered coronary artery calcifications. Mediastinum/Nodes: No mediastinal or hilar mass or lymphadenopathy. Small scattered lymph nodes are noted. The esophagus is unremarkable. Thyroid gland is unremarkable. Lungs/Pleura: Bilateral pleural effusions, moderate on the right and mild on the left. No enhancing pleural nodules are identified. No pulmonary nodules or pulmonary lesions. Upper Abdomen: Upper abdominal ascites again demonstrated. Calcified granulomas are noted in the spleen. Musculoskeletal: No breast masses, supraclavicular or axillary adenopathy.  The bony thorax is intact. Benign T8 hemangioma noted. IMPRESSION: 1. Bilateral pleural effusions, moderate on the right and mild on the left. 2. No mediastinal or hilar mass or adenopathy. 3. No pulmonary nodules or pulmonary lesions. 4. Upper abdominal ascites. Electronically Signed   By: Marijo Sanes M.D.   On: 07/29/2021 12:28   US PELVIS  TRANSVAGINAL NON-OB (TV ONLY)  Result Date: 07/26/2021 CLINICAL DATA:  80 year old female inpatient with a history of hysterectomy and ascites and right adnexal cystic lesion on recent CT. EXAM: ULTRASOUND PELVIS TRANSVAGINAL TECHNIQUE: Transvaginal ultrasound examination of the pelvis was performed including evaluation of the uterus, ovaries, adnexal regions, and pelvic cul-de-sac. COMPARISON:  07/24/2021 CT abdomen/pelvis. FINDINGS: Uterus Surgically absent. Right ovary Measurements: 4.6 x 4.4 x 3.5 cm = volume: 37 mL. There is limited visualization of a 4.2 x 3.9 x 2.8 cm right ovarian cyst with questionable mildly thickened eccentric septation, and no internal vascularity on color Doppler or discrete mural nodularity. Left ovary Nonvisualization of the left ovary.  No left adnexal masses. Other findings:  No abnormal free fluid IMPRESSION: 1. Limited visualization of a 4.2 cm right ovarian cyst, which appears mildly complex with mildly thickened eccentric septation. This cystic lesion is indeterminate for malignancy particularly given the other worrisome findings of large volume ascites and possible omental caking on the recent CT. 2. No abnormal findings at the hysterectomy margin. 3. Nonvisualization of the left ovary.  No left adnexal masses. Electronically Signed   By: Ilona Sorrel M.D.   On: 07/26/2021 12:59   CT Abdomen Pelvis W Contrast  Result Date: 07/24/2021 CLINICAL DATA:  Abdominal distension, nausea. EXAM: CT ABDOMEN AND PELVIS WITH CONTRAST TECHNIQUE: Multidetector CT imaging of the abdomen and pelvis was performed using the standard protocol following bolus administration of intravenous contrast. CONTRAST:  29mL OMNIPAQUE IOHEXOL 350 MG/ML SOLN COMPARISON:  None. FINDINGS: Lower chest: Small bilateral pleural effusions are noted with adjacent subsegmental atelectasis. Hepatobiliary: No focal liver abnormality is seen. Status post cholecystectomy. No biliary dilatation. Pancreas:  Unremarkable. No pancreatic ductal dilatation or surrounding inflammatory changes. Spleen: Calcified splenic granulomata are noted. Adrenals/Urinary Tract: Adrenal glands are unremarkable. Kidneys are normal, without renal calculi, focal lesion, or hydronephrosis. Bladder is unremarkable. Stomach/Bowel: Stomach appears normal. There is no evidence of bowel obstruction or inflammation. Status post appendectomy. Sigmoid diverticulosis is noted without inflammation. Vascular/Lymphatic: Aortic atherosclerosis. Mildly enlarged retroperitoneal and mesenteric lymph nodes are noted which may be inflammatory in etiology, but malignancy or metastatic disease cannot be excluded. Largest lymph node measures 13 mm in right periaortic region. Reproductive: Status post hysterectomy. 3.8 cm right ovarian cystic abnormality is noted. No definite left adnexal abnormality is noted. Other: Moderate ascites is noted. Mild irregular densities are seen involving the omentum concerning for possible peritoneal carcinomatosis. Musculoskeletal: No acute or significant osseous findings. IMPRESSION: Moderate ascites is noted. Mildly enlarged retroperitoneal mesenteric lymph nodes are noted, the largest measuring 13 mm in the right periaortic region. While these may be inflammatory in etiology, malignancy or metastatic disease cannot be excluded. Mild irregular densities are seen involving the omentum which may represent inflammation, but peritoneal carcinomatosis cannot be excluded. Status post hysterectomy. 3.8 cm right ovarian cyst is noted. Recommend follow-up US in 6-12 months. Note: This recommendation does not apply to premenarchal patients and to those with increased risk (genetic, family history, elevated tumor markers or other high-risk factors) of ovarian cancer. Reference: JACR 2020 Feb; 17(2):248-254. Sigmoid diverticulosis without inflammation. Small bilateral pleural effusions with minimal adjacent  subsegmental atelectasis. Aortic  Atherosclerosis (ICD10-I70.0). Electronically Signed   By: Marijo Conception M.D.   On: 07/24/2021 15:43   MR 3D Recon At Scanner  Result Date: 07/31/2021 CLINICAL DATA:  Adenocarcinoma of unknown primary, upper GI primary favored. EXAM: MRI ABDOMEN WITHOUT AND WITH CONTRAST (INCLUDING MRCP) TECHNIQUE: Multiplanar multisequence MR imaging of the abdomen was performed both before and after the administration of intravenous contrast. Heavily T2-weighted images of the biliary and pancreatic ducts were obtained, and three-dimensional MRCP images were rendered by post processing. CONTRAST:  49mL GADAVIST GADOBUTROL 1 MMOL/ML IV SOLN COMPARISON:  CT abdomen/pelvis dated 07/24/2021 FINDINGS: Extremely motion degraded images leading to markedly limited evaluation. Lower chest: Moderate right and small left pleural effusions. Associated lower lobe atelectasis. Hepatobiliary: No focal hepatic lesion is seen. Status post cholecystectomy. No intrahepatic or extrahepatic ductal dilatation. Pancreas:  No parenchymal atrophy or ductal dilatation. Spleen:  Grossly unremarkable. Adrenals/Urinary Tract:  Adrenal glands are within normal limits. Kidneys are grossly unremarkable.  No hydronephrosis. Stomach/Bowel: Mild wall thickening in the region of the gastric antrum/duodenal bulb, poorly evaluated. The visualized bowel is grossly unremarkable. Vascular/Lymphatic:  No evidence of abdominal aortic aneurysm. Small upper abdominal/retroperitoneal lymph nodes, better evaluated on CT. 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 Musculoskeletal: No focal osseous lesions. IMPRESSION: Markedly limited evaluation due to motion degradation. 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 Small upper abdominal/retroperitoneal lymph nodes, better evaluated on CT. Mild wall thickening in the region of the gastric antrum/duodenal bulb, poorly evaluated. Correlate with recent endoscopy. Moderate  right and small left pleural effusions. Associated lower lobe atelectasis. Electronically Signed   By: Julian Hy M.D.   On: 07/31/2021 21:21   US Paracentesis  Result Date: 08/03/2021 INDICATION: History of metastatic cancer with malignant ascites. Request for therapeutic paracentesis EXAM: ULTRASOUND GUIDED THERAPEUTIC PARACENTESIS MEDICATIONS: 10 mL 1% lidocaine COMPLICATIONS: None immediate. PROCEDURE: Informed written consent was obtained from the patient after a discussion of the risks, benefits and alternatives to treatment. A timeout was performed prior to the initiation of the procedure. Initial ultrasound scanning demonstrates a large amount of ascites within the left lower abdominal quadrant. The left lower abdomen was prepped and draped in the usual sterile fashion. 1% lidocaine was used for local anesthesia. Following this, a 6 Fr Safe-T-Centesis catheter was introduced. An ultrasound image was saved for documentation purposes. The paracentesis was performed. The catheter was removed and a dressing was applied. The patient tolerated the procedure well without immediate post procedural complication. FINDINGS: A total of approximately 6 L of clear, yellow fluid was removed. IMPRESSION: Successful ultrasound-guided paracentesis yielding 6 liters of peritoneal fluid. Read by: Narda Rutherford, NP Electronically Signed   By: Michaelle Birks M.D.   On: 08/03/2021 15:35   US Paracentesis  Result Date: 07/25/2021 INDICATION: Concern for intra-abdominal malignancy, now with symptomatic ascites. Please perform ultrasound-guided paracentesis for diagnostic and therapeutic purposes. Max paracentesis volume of 5 L requested. EXAM: ULTRASOUND GUIDED DIAGNOSTIC AND THERAPEUTIC PARACENTESIS COMPARISON:  CT abdomen and pelvis-earlier same day MEDICATIONS: None. COMPLICATIONS: None immediate. TECHNIQUE: Informed written consent was obtained from the patient after a discussion of the risks, benefits and  alternatives to treatment. A timeout was performed prior to the initiation of the procedure. Initial ultrasound scanning demonstrates a large amount of ascites within the left lower abdomen which was subsequently prepped and draped in the usual sterile fashion. 1% lidocaine with epinephrine was used for local anesthesia. An ultrasound image was saved  for documentation purposed. An 8 Fr Safe-T-Centesis catheter was introduced. The paracentesis was performed. The catheter was removed and a dressing was applied. The patient tolerated the procedure well without immediate post procedural complication. FINDINGS: A total of approximately 5 liters of serous fluid was removed. Samples were sent to the laboratory as requested by the clinical team. IMPRESSION: Successful ultrasound-guided paracentesis yielding 5 liters of peritoneal fluid. Electronically Signed   By: Sandi Mariscal M.D.   On: 07/25/2021 08:18   MR ABDOMEN MRCP W WO CONTAST  Result Date: 07/31/2021 CLINICAL DATA:  Adenocarcinoma of unknown primary, upper GI primary favored. EXAM: MRI ABDOMEN WITHOUT AND WITH CONTRAST (INCLUDING MRCP) TECHNIQUE: Multiplanar multisequence MR imaging of the abdomen was performed both before and after the administration of intravenous contrast. Heavily T2-weighted images of the biliary and pancreatic ducts were obtained, and three-dimensional MRCP images were rendered by post processing. CONTRAST:  58mL GADAVIST GADOBUTROL 1 MMOL/ML IV SOLN COMPARISON:  CT abdomen/pelvis dated 07/24/2021 FINDINGS: Extremely motion degraded images leading to markedly limited evaluation. Lower chest: Moderate right and small left pleural effusions. Associated lower lobe atelectasis. Hepatobiliary: No focal hepatic lesion is seen. Status post cholecystectomy. No intrahepatic or extrahepatic ductal dilatation. Pancreas:  No parenchymal atrophy or ductal dilatation. Spleen:  Grossly unremarkable. Adrenals/Urinary Tract:  Adrenal glands are within  normal limits. Kidneys are grossly unremarkable.  No hydronephrosis. Stomach/Bowel: Mild wall thickening in the region of the gastric antrum/duodenal bulb, poorly evaluated. The visualized bowel is grossly unremarkable. Vascular/Lymphatic:  No evidence of abdominal aortic aneurysm. Small upper abdominal/retroperitoneal lymph nodes, better evaluated on CT. 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 Musculoskeletal: No focal osseous lesions. IMPRESSION: Markedly limited evaluation due to motion degradation. 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 Small upper abdominal/retroperitoneal lymph nodes, better evaluated on CT. Mild wall thickening in the region of the gastric antrum/duodenal bulb, poorly evaluated. Correlate with recent endoscopy. Moderate right and small left pleural effusions. Associated lower lobe atelectasis. Electronically Signed   By: Julian Hy M.D.   On: 07/31/2021 21:21   [place] ASCITES (ABDOMEN LIMITED)  Result Date: 07/27/2021 CLINICAL DATA:  Patient is complaining of leaking at previous paracentesis puncture site. EXAM: LIMITED ABDOMEN ULTRASOUND FOR ASCITES TECHNIQUE: Limited ultrasound survey for ascites was performed in all four abdominal quadrants. COMPARISON:  07/24/2021 FINDINGS: Small to moderate amount of ascites in the right upper quadrant and right lower quadrant. Minimal ascites in left upper quadrant. No significant ascites in left lower quadrant. IMPRESSION: Small to moderate amount of ascites in the right abdomen. Paracentesis not performed. Electronically Signed   By: Markus Daft M.D.   On: 07/27/2021 13:32    ASSESSMENT AND PLAN: 80 year old female with   1.  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   2.retroperitoneal lymphadenopathy and possible peritoneal carcinomatosis -07/24/2021 CT of the abdomen/pelvis with  contrast- "Moderate ascites is noted. Mildly enlarged retroperitoneal mesenteric lymph nodes are noted, the largest measuring 13 mm in the right periaortic region. While these may be inflammatory in etiology, malignancy or metastatic disease cannot be excluded. Mild irregular densities are seen involving the omentum which may represent inflammation, but peritoneal carcinomatosis cannot be excluded. Status post hysterectomy. 3.8 cm right ovarian cyst is noted. Recommend an EGD tomorrow [place] in 6-12 months. Note: This recommendation does not apply to premenarchal patients and to those with increased risk (genetic, family history, elevated tumor markers or other  high-risk factors) of ovarian cancer. Reference: JACR 2020 Feb; 17(2):248-254. Sigmoid diverticulosis without inflammation. Small bilateral pleural effusions with minimal adjacent subsegmental atelectasis. Aortic Atherosclerosis (ICD10-I70.0)." -07/26/2021 transvaginal ultrasound- "1. Limited visualization of a 4.2 cm right ovarian cyst, which appears mildly complex with mildly thickened eccentric septation. This cystic lesion is indeterminate for malignancy particularly given the other worrisome findings of large volume ascites and possible omental caking on the recent CT. 2. No abnormal findings at the hysterectomy margin.3. Nonvisualization of the left ovary.  No left adnexal masses." -07/31/2021 ERCP-gastritis which was biopsied, mucosal changes in the duodenum which was biopsied.  Biopsies negative for malignancy. -07/31/2021 MRCP- "Markedly limited evaluation due to motion degradation. 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  Small upper abdominal/retroperitoneal lymph nodes, better evaluated on CT. Mild wall thickening in the region of the gastric antrum/duodenal bulb, poorly evaluated. Correlate with recent endoscopy. Moderate right and small left pleural effusions. Associated lower lobe atelectasis."   3.  Protein calorie  malnutrition   4.  Hypertension   5.  Hyperlipidemia   6.  Type II diabetes mellitus  PLAN: -Cytology from ascitic fluid most consistent with upper GI primary but MRCP and EGD did not identify definitive mass and biopsies were negative. -She was seen in our office yesterday with overall worsening of her performance status and nutritional status.  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  Given poor performance status, do not think that she would tolerate chemotherapy.  The patient is open to a referral to hospice.  Recommend arranging referral to hospice upon discharge. -Recommend proceeding with paracentesis today and placement of peritoneal drain. -We will discontinue Megace as it may worsen her edema and we will try her on Marinol 2.5 mg twice a day.  No future appointments.    LOS: 1 day   Mikey Bussing, DNP, AGPCNP-BC, AOCNP 08/14/21   ADDENDUM  .Patient was Personally and independently interviewed, examined and relevant elements of the history of present illness were reviewed in details and an assessment and plan was created. All elements of the patient's history of present illness , assessment and plan were discussed in details with Mikey Bussing, DNP, AGPCNP-BC, AOCNP. The above documentation reflects our combined findings assessment and plan.  -Marinol started to help with nausea and appetite -IR for peritoneal drain placement so she can have continued drainage of her ascites fluid for comfort. -Social work involvement to help enroll patient in hospice cares.  Depending on patient and family's wishes and her burden of nursing cares she might need to consider inpatient hospice at [facility] versus home hospice. -Poor performance status and heavy burden of symptoms as well as inability to nutritionally sustain herself precludes any palliative chemotherapy at this time.  We discussed that if  this were rather chemosensitive tumor such as ovarian cancer or lymphomas we might consider treatment even with poor functional status but with her metastatic adenocarcinoma being upper GI primary disease response rates even with full dose palliative chemotherapy would be limited and therefore would not be strongly recommended. -In discussing with the patient in the clinic as well is as inpatient she and her daughter are in agreement with pursuing best supportive cares through hospice.  [name] MD MS

## 2021-08-15 DIAGNOSIS — Z20822 Contact with and (suspected) exposure to covid-19: Secondary | ICD-10-CM | POA: Diagnosis present

## 2021-08-15 DIAGNOSIS — E872 Acidosis, unspecified: Secondary | ICD-10-CM | POA: Diagnosis present

## 2021-08-15 DIAGNOSIS — Z90710 Acquired absence of both cervix and uterus: Secondary | ICD-10-CM | POA: Diagnosis not present

## 2021-08-15 DIAGNOSIS — Z66 Do not resuscitate: Secondary | ICD-10-CM | POA: Diagnosis present

## 2021-08-15 DIAGNOSIS — I10 Essential (primary) hypertension: Secondary | ICD-10-CM | POA: Diagnosis present

## 2021-08-15 DIAGNOSIS — Z8249 Family history of ischemic heart disease and other diseases of the circulatory system: Secondary | ICD-10-CM | POA: Diagnosis not present

## 2021-08-15 DIAGNOSIS — Z9049 Acquired absence of other specified parts of digestive tract: Secondary | ICD-10-CM | POA: Diagnosis not present

## 2021-08-15 DIAGNOSIS — E119 Type 2 diabetes mellitus without complications: Secondary | ICD-10-CM | POA: Diagnosis present

## 2021-08-15 DIAGNOSIS — K7011 Alcoholic hepatitis with ascites: Secondary | ICD-10-CM | POA: Diagnosis not present

## 2021-08-15 DIAGNOSIS — N179 Acute kidney failure, unspecified: Secondary | ICD-10-CM | POA: Diagnosis present

## 2021-08-15 DIAGNOSIS — E43 Unspecified severe protein-calorie malnutrition: Secondary | ICD-10-CM | POA: Diagnosis present

## 2021-08-15 DIAGNOSIS — C269 Malignant neoplasm of ill-defined sites within the digestive system: Secondary | ICD-10-CM | POA: Diagnosis present

## 2021-08-15 DIAGNOSIS — Z7984 Long term (current) use of oral hypoglycemic drugs: Secondary | ICD-10-CM | POA: Diagnosis not present

## 2021-08-15 DIAGNOSIS — Z823 Family history of stroke: Secondary | ICD-10-CM | POA: Diagnosis not present

## 2021-08-15 DIAGNOSIS — Z79899 Other long term (current) drug therapy: Secondary | ICD-10-CM | POA: Diagnosis not present

## 2021-08-15 DIAGNOSIS — R180 Malignant ascites: Secondary | ICD-10-CM | POA: Diagnosis not present

## 2021-08-15 DIAGNOSIS — M545 Low back pain, unspecified: Secondary | ICD-10-CM | POA: Diagnosis present

## 2021-08-15 DIAGNOSIS — E785 Hyperlipidemia, unspecified: Secondary | ICD-10-CM | POA: Diagnosis present

## 2021-08-15 DIAGNOSIS — E871 Hypo-osmolality and hyponatremia: Secondary | ICD-10-CM | POA: Diagnosis present

## 2021-08-15 DIAGNOSIS — Z833 Family history of diabetes mellitus: Secondary | ICD-10-CM | POA: Diagnosis not present

## 2021-08-15 DIAGNOSIS — Z885 Allergy status to narcotic agent status: Secondary | ICD-10-CM | POA: Diagnosis not present

## 2021-08-15 LAB — GLUCOSE, CAPILLARY
Glucose-Capillary: 135 mg/dL — ABNORMAL HIGH (ref 70–99)
Glucose-Capillary: 199 mg/dL — ABNORMAL HIGH (ref 70–99)
Glucose-Capillary: 146 mg/dL — ABNORMAL HIGH (ref 70–99)
Glucose-Capillary: 140 mg/dL — ABNORMAL HIGH (ref 70–99)

## 2021-08-15 MED ORDER — SENNOSIDES-DOCUSATE SODIUM 8.6-50 MG PO TABS: 1.0000 | ORAL_TABLET | Freq: Every evening | ORAL | 0 refills | Status: AC | PRN

## 2021-08-15 MED ORDER — PROSOURCE PLUS PO LIQD: 30.0000 mL | Freq: Three times a day (TID) | ORAL | 0 refills | Status: AC

## 2021-08-15 MED ORDER — DRONABINOL 2.5 MG PO CAPS: 2.5000 mg | ORAL_CAPSULE | Freq: Two times a day (BID) | ORAL | 0 refills | Status: AC

## 2021-08-15 NOTE — Progress Notes (Signed)
[facility]) Hospital Liaison Note  Received request for hospice services at home after discharge. Chart and patient information reviewed by [facility] physician. Hospice eligibility confirmed.  Spoke with daughter Ruffin Frederick to initiate education related to hospice philosophy, services and team approach to care. Patient/family verbalized understanding of information provided.   DME needs discussed. Patient has the following equipment in the home: rollator, walker, shower chair. No DME needs prior to discharge from hospital.   Please send signed and completed DNR home with patient/family. Please provide prescriptions at discharge as needed to ensure ongoing symptom management.   [facility] information and contact numbers given to family. Above information shared with [facility].   Please do not hesitate to call with any hospice related questions or concerns.   Thank you for the opportunity to participate in this patient's care.   Bobbie "Loren Racer, RN, BSN [facility] Liaison [phone]

## 2021-08-15 NOTE — Progress Notes (Signed)
Mobility Specialist - Progress Note    08/15/21 1214  Mobility  Activity Dangled on edge of bed  Level of Assistance Minimal assist, patient does 75% or more  Assistive Device None  Mobility Sit up in bed/chair position for meals  Mobility Response Tolerated fair  Mobility performed by Mobility specialist  $Mobility charge 1 Mobility   Upon entry pt stated feeling weaker than usual, but was agreeable to mobilize. Pt able to move legs towards EOB and pull herself up, but required Min A to move hips forwards and to sit upright at EOB. Pt sat at EOB for ~2 minutes and then stated feeling dizzy and nauseous. Before grabbing Dinamap, pt insisted on laying back down. RN called to help re-center pt in bed. Pt left in bed with call bell at side and requested to try again later. Will check back as schedule permits.   [place] Specialist Acute Rehabilitation Services Phone: [phone] 08/15/21, 12:19 PM

## 2021-08-15 NOTE — TOC Progression Note (Signed)
Transition of Care [facility]) - Progression Note    Patient Details  Name: Amanda Gordon MRN: [id number] Date of Birth: May 03, 1941  Transition of Care [facility]) CM/SW Contact  Ross Ludwig,  Phone Number: 08/15/2021, 10:43 AM  Clinical Narrative:     CSW was informed that patient was referred to [facility] for hospice at home.  [facility] was made aware of referral, they will follow up with patient.  CSW to continue to follow patient's progress throughout discharge planning.        Expected Discharge Plan and Services                                                 Social Determinants of Health (SDOH) Interventions    Readmission Risk Interventions No flowsheet data found.

## 2021-08-16 DIAGNOSIS — E440 Moderate protein-calorie malnutrition: Secondary | ICD-10-CM | POA: Insufficient documentation

## 2021-08-16 DIAGNOSIS — R180 Malignant ascites: Secondary | ICD-10-CM | POA: Diagnosis not present

## 2021-08-16 LAB — BASIC METABOLIC PANEL
Anion gap: 14 (ref 5–15)
BUN: 70 mg/dL — ABNORMAL HIGH (ref 8–23)
CO2: 15 mmol/L — ABNORMAL LOW (ref 22–32)
Calcium: 8.6 mg/dL — ABNORMAL LOW (ref 8.9–10.3)
Chloride: 92 mmol/L — ABNORMAL LOW (ref 98–111)
Creatinine, Ser: 1.45 mg/dL — ABNORMAL HIGH (ref 0.44–1.00)
GFR, Estimated: 37 mL/min — ABNORMAL LOW (ref >60)
Glucose, Bld: 133 mg/dL — ABNORMAL HIGH (ref 70–99)
Potassium: 3.8 mmol/L (ref 3.5–5.1)
Sodium: 121 mmol/L — ABNORMAL LOW (ref 135–145)

## 2021-08-16 LAB — GLUCOSE, CAPILLARY
Glucose-Capillary: 134 mg/dL — ABNORMAL HIGH (ref 70–99)
Glucose-Capillary: 157 mg/dL — ABNORMAL HIGH (ref 70–99)
Glucose-Capillary: 159 mg/dL — ABNORMAL HIGH (ref 70–99)

## 2021-08-16 MED ORDER — DICLOFENAC SODIUM 1 % EX GEL
4.0000 g | Freq: Four times a day (QID) | CUTANEOUS | Status: DC
  Administered 2021-08-16 – 2021-08-18 (×9): 4 g via TOPICAL
  Filled 2021-08-16: qty 100

## 2021-08-16 MED ORDER — PANTOPRAZOLE SODIUM 40 MG PO TBEC
40.0000 mg | DELAYED_RELEASE_TABLET | Freq: Two times a day (BID) | ORAL | Status: DC
  Administered 2021-08-16 – 2021-08-18 (×5): 40 mg via ORAL
  Filled 2021-08-16 (×6): qty 1

## 2021-08-16 MED ORDER — ALUM HYDROXIDE-MAG CARBONATE 95-358 MG/15ML PO SUSP: 30.0000 mL | Freq: Four times a day (QID) | ORAL | Status: DC

## 2021-08-16 MED ORDER — HYDROMORPHONE HCL 1 MG/ML IJ SOLN: 0.5000 mg | INTRAMUSCULAR | Status: DC | PRN

## 2021-08-16 MED ORDER — ALUM & MAG HYDROXIDE-SIMETH 200-200-20 MG/5ML PO SUSP
30.0000 mL | Freq: Four times a day (QID) | ORAL | Status: DC
  Administered 2021-08-16 – 2021-08-18 (×10): 30 mL via ORAL
  Filled 2021-08-16 (×10): qty 30

## 2021-08-16 MED ORDER — TRAZODONE HCL 50 MG PO TABS
50.0000 mg | ORAL_TABLET | Freq: Every evening | ORAL | Status: DC | PRN
  Administered 2021-08-16: 50 mg via ORAL
  Filled 2021-08-16: qty 1

## 2021-08-16 NOTE — Progress Notes (Signed)
   IR aware of this pt. Dr Wynelle Cleveland requesting PleurX catheter placement for malignant ascites.  Dr Denna Haggard has reviewed chart and approves procedure.  Can be done as OP For DC to home soon  I have given IR scheduler pts name and info Pt will hear from scheduler asap.

## 2021-08-16 NOTE — Progress Notes (Signed)
[facility]) Hospital Liaison RN Note  Daughter Ruffin Frederick states that she now feels she will need hoyer lift and 3/1 delivered prior to patient discharging home from hospital. Address verified and correct in the chart. DME to be delivered on 10.31.22. TOC aware.   Please do not hesitate to call with any hospice related questions or concerns.    Thank you,    Bobbie "Loren Racer, [place], BSN [facility] Liaison [phone]

## 2021-08-16 NOTE — Progress Notes (Addendum)
PROGRESS NOTE    Amanda Gordon   WUJ:[id number]  DOB: 05/21/1941  DOA: 08/13/2021 PCP: Elias Else, MD   Brief Narrative:  Amanda Gordon a 80 y.o. 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  Patient recently admitted 07/24/2021-08/05/2021 for newly diagnosed malignant ascites.  Patient underwent paracentesis on 10/10 and 10/17.  Cytology and immunohistochemical markers were suggestive of upper GI source.  GI were consulted and patient underwent EGD on 10/14 which was unrevealing.  MRCP done 10/14 showed mild wall thickening in the region of the gastric antrum/duodenal bulb, poorly evaluated.  Biopsies from EGD were negative.  Of note, patient was last discharged on Lasix as it was causing mild creatinine elevation and hyponatremia while in hospital.     Subjective: She feels quite weak and has no appetite. Her lowe back and her abdomen hurt.     Assessment & Plan:   Principal Problem:   Malignant ascites -  she underwent a third paracentesis on 10/29- a little over 6 L were removed -  plan for abdominal drain next week- IR will give an appointment to the patient - given 50 grams of Albumin after the paracentesis to prevent re-accumulation of fluid and hypotension   Active Problems: Adenocarcinoma of presumed upper GI or pancreaticobiliary origin   Goals of care, counseling/discussion - patient, daughter and oncology have decided to pursue hospice- we will see if she can go home with hospice- if not, we will pursue a hospice home- having ongoing discussion with patient, her daughter Tillman Abide and Dr Candise Che - we are awaiting a hoyer lift to be delivered to her home before she goes home with hospice      Poor oral intake/ moderate malnutrition -evidenced by energy intake < or equal to 75%  for > or equal to 1 month, moderate fat depletion, moderate muscle depletion - in setting of cancer- on Marinol- oral intake not yet improved   AKI with metabolic acidosis -due to poor oral intake and third spacing of fluid - Cr baseline is 0.81- Cr 1.45, Sodium 121, Bicarb 15  Lower back pain, musculoskeletal  - start Voltaren gel-  have ordered a K pad       Time spent in minutes: 35 DVT prophylaxis: enoxaparin (LOVENOX) injection 30 mg Start: 08/14/21 2200 Code Status: DNR Family Communication: daughter, Tillman Abide Level of Care: Level of care: Med-Surg Disposition Plan:  Status is: Inpatient  Remains inpatient appropriate because: awaiting safe transition to home with hospice      Consultants:  Palliative care oncology Procedures:  Paracentesis  Antimicrobials:  Anti-infectives (From admission, onward)    None        Objective: Vitals:   08/15/21 1536 08/15/21 2150 08/16/21 0658 08/16/21 1342  BP: 104/67 99/70 98/75  97/78  Pulse: (!) 103 99 100 (!) 105  Resp: 20 17 19 15   Temp: 97.6 F (36.4 C) 97.7 F (36.5 C) 99 F (37.2 C)   TempSrc: Oral Oral Oral   SpO2: 96% 94% 94% 92%  Weight:      Height:        Intake/Output Summary (Last 24 hours) at 08/16/2021 1443 Last data filed at 08/16/2021 1300 Gross per 24 hour  Intake 200 ml  Output --  Net 200 ml   Filed Weights   08/13/21 2118 08/14/21 0624  Weight: 71.6 kg  74 kg    Examination: General exam: Appears comfortable  HEENT: PERRLA, oral mucosa moist, no sclera icterus or thrush Respiratory system: Clear to auscultation. Respiratory effort normal. Cardiovascular system: S1 & S2 heard, RRR.   Gastrointestinal system: Abdomen soft, non-tender, moderately distended. Normal bowel sounds. Central nervous system: Alert and oriented. No focal neurological deficits. Extremities: No cyanosis, clubbing or edema Skin: No rashes or ulcers Psychiatry:  Mood & affect appropriate.     Data  Reviewed: I have personally reviewed following labs and imaging studies  CBC: Recent Labs  Lab 08/13/21 2350  WBC 13.6*  HGB 15.0  HCT 43.8  MCV 81.0  PLT 572*   Basic Metabolic Panel: Recent Labs  Lab 08/13/21 2350 08/16/21 0924  NA 122* 121*  K 5.1 3.8  CL 91* 92*  CO2 14* 15*  GLUCOSE 100* 133*  BUN 58* 70*  CREATININE 3.26* 1.45*  CALCIUM 8.1* 8.6*  MG 2.4  --    GFR: Estimated Creatinine Clearance: 30.3 mL/min (A) (by C-G formula based on SCr of 1.45 mg/dL (H)). Liver Function Tests: Recent Labs  Lab 08/13/21 2350  AST 20  ALT 12  ALKPHOS 82  BILITOT 0.7  PROT 6.4*  ALBUMIN 2.7*   No results for input(s): LIPASE, AMYLASE in the last 168 hours. No results for input(s): AMMONIA in the last 168 hours. Coagulation Profile: Recent Labs  Lab 08/13/21 2350  INR 1.2   Cardiac Enzymes: No results for input(s): CKTOTAL, CKMB, CKMBINDEX, TROPONINI in the last 168 hours. BNP (last 3 results) No results for input(s): PROBNP in the last 8760 hours. HbA1C: No results for input(s): HGBA1C in the last 72 hours. CBG: Recent Labs  Lab 08/15/21 1116 08/15/21 1723 08/15/21 2151 08/16/21 0800 08/16/21 1240  GLUCAP 199* 146* 140* 134* 157*   Lipid Profile: No results for input(s): CHOL, HDL, LDLCALC, TRIG, CHOLHDL, LDLDIRECT in the last 72 hours. Thyroid Function Tests: No results for input(s): TSH, T4TOTAL, FREET4, T3FREE, THYROIDAB in the last 72 hours. Anemia Panel: No results for input(s): VITAMINB12, FOLATE, FERRITIN, TIBC, IRON, RETICCTPCT in the last 72 hours. Urine analysis:    Component Value Date/Time   COLORURINE STRAW (A) 07/26/2021 2130   APPEARANCEUR CLEAR 07/26/2021 2130   LABSPEC 1.004 (L) 07/26/2021 2130   PHURINE 7.0 07/26/2021 2130   GLUCOSEU NEGATIVE 07/26/2021 2130   HGBUR MODERATE (A) 07/26/2021 2130   BILIRUBINUR NEGATIVE 07/26/2021 2130   KETONESUR NEGATIVE 07/26/2021 2130   PROTEINUR NEGATIVE 07/26/2021 2130   NITRITE NEGATIVE  07/26/2021 2130   LEUKOCYTESUR NEGATIVE 07/26/2021 2130   Sepsis Labs: @LABRCNTIP (procalcitonin:4,lacticidven:4) ) Recent Results (from the past 240 hour(s))  Resp Panel by RT-PCR (Flu A&B, Covid) Nasopharyngeal Swab     Status: None   Collection Time: 08/13/21 12:18 AM   Specimen: Nasopharyngeal Swab; Nasopharyngeal(NP) swabs in vial transport medium  Result Value Ref Range Status   SARS Coronavirus 2 by RT PCR NEGATIVE NEGATIVE Final    Comment: (NOTE) SARS-CoV-2 target nucleic acids are NOT DETECTED.  The SARS-CoV-2 RNA is generally detectable in upper respiratory specimens during the acute phase of infection. The lowest concentration of SARS-CoV-2 viral copies this assay can detect is 138 copies/mL. A negative result does not preclude SARS-Cov-2 infection and should not be used as the sole basis for treatment or other patient management decisions. A negative result may occur with  improper specimen collection/handling, submission of specimen other than nasopharyngeal swab, presence of viral mutation(s) within the areas targeted by this assay, and  inadequate number of viral copies(<138 copies/mL). A negative result must be combined with clinical observations, patient history, and epidemiological information. The expected result is Negative.  Fact Sheet for Patients:  BloggerCourse.com  Fact Sheet for Healthcare Providers:  SeriousBroker.it  This test is no t yet approved or cleared by the [place] FDA and  has been authorized for detection and/or diagnosis of SARS-CoV-2 by FDA under an Emergency Use Authorization (EUA). This EUA will remain  in effect (meaning this test can be used) for the duration of the COVID-19 declaration under Section 564(b)(1) of the Act, 21 U.S.C.section 360bbb-3(b)(1), unless the authorization is terminated  or revoked sooner.       Influenza A by PCR NEGATIVE NEGATIVE Final   Influenza B  by PCR NEGATIVE NEGATIVE Final    Comment: (NOTE) The Xpert Xpress SARS-CoV-2/FLU/RSV plus assay is intended as an aid in the diagnosis of influenza from Nasopharyngeal swab specimens and should not be used as a sole basis for treatment. Nasal washings and aspirates are unacceptable for Xpert Xpress SARS-CoV-2/FLU/RSV testing.  Fact Sheet for Patients: BloggerCourse.com  Fact Sheet for Healthcare Providers: SeriousBroker.it  This test is not yet approved or cleared by the [place] FDA and has been authorized for detection and/or diagnosis of SARS-CoV-2 by FDA under an Emergency Use Authorization (EUA). This EUA will remain in effect (meaning this test can be used) for the duration of the COVID-19 declaration under Section 564(b)(1) of the Act, 21 U.S.C. section 360bbb-3(b)(1), unless the authorization is terminated or revoked.  Performed at [facility], [address], [place], [place] [address]          Radiology Studies: No results found.    Scheduled Meds:  (feeding supplement) PROSource Plus  30 mL Oral TID BM   alum & mag hydroxide-simeth  30 mL Oral QID   diclofenac Sodium  4 g Topical QID   dronabinol  2.5 mg Oral BID AC   enoxaparin (LOVENOX) injection  30 mg Subcutaneous Q24H   influenza vaccine adjuvanted  0.5 mL Intramuscular Tomorrow-1000   metoCLOPramide (REGLAN) injection  10 mg Intravenous Q8H   multivitamin with minerals  1 tablet Oral Daily   pantoprazole  40 mg Oral BID AC   protein supplement  1 Scoop Oral TID WC   Continuous Infusions:   LOS: 2 days      Calvert Cantor, MD [facility] Pager: [email or web address] 08/16/2021, 2:43 PM

## 2021-08-16 NOTE — TOC Progression Note (Addendum)
Transition of Care [facility]) - Progression Note    Patient Details  Name: Amaree Loisel [place] MRN: [id number] Date of Birth: 09-03-1941  [facility] CM/SW Contact  Herve Haug, Juliann Pulse, RN Phone Number: 08/16/2021, 12:59 PM  Clinical Narrative: [facility] Chick care rep Jolayne Haines following for home w/hospice services;they will manage any dme needs-noted 3n1,hoyer lift to be delivered to home prior d/c managed by [facility]. Continue to follow for d/c plans.  1p-left vm w/[place] transport-dtr Valora Piccolo needing info on transportation services from home otpt to [facility] radiology dept in near future-await call back or for [place] to contact dtr Janiece. PTAR needed @ d/c-confirmed address, DNR must be signed.Continue to monitor for d/c needs.    Expected Discharge Plan: Home w Hospice Care Barriers to Discharge: Continued Medical Work up  Expected Discharge Plan and Services Expected Discharge Plan: [place] Date [place]: 08/16/21 Time [place]: 66 Representative spoke with at [place]: [place] Determinants of Health ([place]) Interventions    Readmission Risk Interventions No flowsheet data found.

## 2021-08-17 ENCOUNTER — Inpatient Hospital Stay (HOSPITAL_COMMUNITY): Payer: Medicare Other

## 2021-08-17 DIAGNOSIS — R180 Malignant ascites: Secondary | ICD-10-CM | POA: Diagnosis not present

## 2021-08-17 LAB — CBC WITH DIFFERENTIAL/PLATELET
Abs Immature Granulocytes: 0.16 10*3/uL — ABNORMAL HIGH (ref 0.00–0.07)
Basophils Absolute: 0 10*3/uL (ref 0.0–0.1)
Basophils Relative: 0 %
Eosinophils Absolute: 0 10*3/uL (ref 0.0–0.5)
Eosinophils Relative: 0 %
HCT: 37.5 % (ref 36.0–46.0)
Hemoglobin: 12.7 g/dL (ref 12.0–15.0)
Immature Granulocytes: 1 %
Lymphocytes Relative: 5 %
Lymphs Abs: 0.6 10*3/uL — ABNORMAL LOW (ref 0.7–4.0)
MCH: 27.4 pg (ref 26.0–34.0)
MCHC: 33.9 g/dL (ref 30.0–36.0)
MCV: 81 fL (ref 80.0–100.0)
Monocytes Absolute: 0.8 10*3/uL (ref 0.1–1.0)
Monocytes Relative: 6 %
Neutro Abs: 11.1 10*3/uL — ABNORMAL HIGH (ref 1.7–7.7)
Neutrophils Relative %: 88 %
Platelets: 389 10*3/uL (ref 150–400)
RBC: 4.63 MIL/uL (ref 3.87–5.11)
RDW: 16.3 % — ABNORMAL HIGH (ref 11.5–15.5)
WBC: 12.7 10*3/uL — ABNORMAL HIGH (ref 4.0–10.5)
nRBC: 0 % (ref 0.0–0.2)

## 2021-08-17 LAB — GLUCOSE, CAPILLARY
Glucose-Capillary: 181 mg/dL — ABNORMAL HIGH (ref 70–99)
Glucose-Capillary: 153 mg/dL — ABNORMAL HIGH (ref 70–99)
Glucose-Capillary: 128 mg/dL — ABNORMAL HIGH (ref 70–99)
Glucose-Capillary: 134 mg/dL — ABNORMAL HIGH (ref 70–99)

## 2021-08-17 MED ORDER — TRAZODONE HCL 50 MG PO TABS: 50.0000 mg | ORAL_TABLET | Freq: Every evening | ORAL | 0 refills | Status: AC | PRN

## 2021-08-17 MED ORDER — CEFAZOLIN SODIUM-DEXTROSE 2-4 GM/100ML-% IV SOLN: 2.0000 g | INTRAVENOUS | Status: AC

## 2021-08-17 MED ORDER — DICLOFENAC SODIUM 1 % EX GEL: 4.0000 g | Freq: Four times a day (QID) | CUTANEOUS | 0 refills | Status: AC

## 2021-08-17 MED ORDER — ALBUMIN HUMAN 25 % IV SOLN
50.0000 g | Freq: Once | INTRAVENOUS | Status: AC
  Administered 2021-08-17: 50 g via INTRAVENOUS
  Filled 2021-08-17: qty 200

## 2021-08-17 MED ORDER — ENOXAPARIN SODIUM 30 MG/0.3ML IJ SOSY: 30.0000 mg | PREFILLED_SYRINGE | INTRAMUSCULAR | Status: DC

## 2021-08-17 MED ORDER — MORPHINE SULFATE (CONCENTRATE) 20 MG/ML PO SOLN: 5.0000 mg | ORAL | 0 refills | Status: AC | PRN

## 2021-08-17 NOTE — Progress Notes (Signed)
Referring Physician(s): Rizwan,S/Kale.G  Supervising Physician: Sandi Mariscal  Patient Status:  [facility] - In-pt  Chief Complaint:  Abdominal/back pain, weakness; newly diagnosed malignant ascites showing adenocarcinoma cells on cytology of presumed upper GI origin or pancreaticobiliary origin   Subjective: Patient known to IR service from paracentesis x3, latest on 08/14/2021 yielding 7 L of fluid.  She has a past medical history of hyperlipidemia, hypertension, diabetes, acute kidney injury, protein calorie malnutrition, and newly diagnosed malignant ascites showing adenocarcinoma cells on cytology of presumed upper GI origin or pancreaticobiliary origin(stage IV).  Both patient and family have decided to pursue hospice and request now received for tunneled peritoneal drain placement to assist with ascites removal.  Patient remains very weak with no appetite and continued abdominal and back discomfort.  She denies fever, headache, worsening dyspnea, cough, nausea, vomiting or bleeding.  Past Medical History:  Diagnosis Date   Allergy    Anemia    Complication of anesthesia    Hyperlipidemia    Hypertension    PONV (postoperative nausea and vomiting)    Primary localized osteoarthritis of left knee 07/21/2021   Type 2 diabetes mellitus ([place])    Past Surgical History:  Procedure Laterality Date   ABDOMINAL HYSTERECTOMY     APPENDECTOMY     BIOPSY  07/31/2021   Procedure: BIOPSY;  Surgeon: Otis Brace, MD;  Location: [facility] ENDOSCOPY;  Service: Gastroenterology;;   CHOLECYSTECTOMY     ESOPHAGOGASTRODUODENOSCOPY (EGD) WITH PROPOFOL N/A 07/31/2021   Procedure: ESOPHAGOGASTRODUODENOSCOPY (EGD) WITH PROPOFOL;  Surgeon: Otis Brace, MD;  Location: [facility] ENDOSCOPY;  Service: Gastroenterology;  Laterality: N/A;      Allergies: Codeine  Medications: Prior to Admission medications   Medication Sig Start Date End Date Taking? Authorizing Provider  atorvastatin (LIPITOR) 40 MG  tablet Take 40 mg by mouth every evening.   Yes [provider]  ibuprofen (ADVIL) 200 MG tablet Take 600 mg by mouth every 6 (six) hours as needed for fever, headache or mild pain.   Yes [provider]  megestrol (MEGACE) 40 MG tablet Take 1 tablet (40 mg total) by mouth 2 (two) times daily. 08/05/21  Yes Caren Griffins, MD  metFORMIN (GLUCOPHAGE) 500 MG tablet Take 1,000 mg by mouth in the morning and at bedtime.   Yes [provider]  metoprolol succinate (TOPROL XL) 25 MG 24 hr tablet Take 1 tablet (25 mg total) by mouth daily. 08/05/21 08/05/22 Yes Gherghe, Vella Redhead, MD  morphine (ROXANOL) 20 MG/ML concentrated solution Take 0.25-0.5 mLs (5-10 mg total) by mouth every 4 (four) hours as needed for severe pain. 08/17/21  Yes Debbe Odea, MD  omeprazole (PRILOSEC) 20 MG capsule Take 20 mg by mouth daily.   Yes [provider]  prochlorperazine (COMPAZINE) 5 MG tablet Take 5 mg by mouth every 6 (six) hours as needed for nausea or vomiting. 08/12/21  Yes [provider]  trolamine salicylate (ASPERCREME) 10 % cream Apply 1 application topically as needed for muscle pain.   Yes [provider]  diclofenac Sodium (VOLTAREN) 1 % GEL Apply 4 g topically 4 (four) times daily. 08/17/21   Debbe Odea, MD  dronabinol (MARINOL) 2.5 MG capsule Take 1 capsule (2.5 mg total) by mouth 2 (two) times daily before lunch and supper. 08/15/21   Debbe Odea, MD  Nutritional Supplements (,FEEDING SUPPLEMENT, PROSOURCE PLUS) liquid Take 30 mLs by mouth 3 (three) times daily between meals. 08/15/21   Debbe Odea, MD  ondansetron (ZOFRAN-ODT) 4 MG disintegrating  tablet Take 1 tablet (4 mg total) by mouth every 8 (eight) hours as needed for nausea or vomiting. Patient not taking: No sig reported 08/05/21   Caren Griffins, MD  senna-docusate (SENOKOT-S) 8.6-50 MG tablet Take 1 tablet by mouth at bedtime as needed for mild constipation. 08/15/21   Debbe Odea, MD  traZODone (DESYREL) 50 MG tablet Take 1 tablet (50 mg total) by mouth at bedtime as needed for sleep. 08/17/21   Debbe Odea, MD     Vital Signs: BP 114/86 (BP Location: Left Arm)   Pulse 94   Temp 97.9 F (36.6 C) (Oral)   Resp 14   Ht 5\' 3"  (1.6 m)   Wt 163 lb 2.3 oz (74 kg)   SpO2 96%   BMI 28.90 kg/m   Physical Exam patient awake but appears very weak.  Daughters are in room.  Chest with diminished breath sounds bases.  Heart with regular rate and rhythm.  Abdomen slightly distended, soft, few bowel sounds, some mild diffuse tenderness to palpation.  Bilateral lower extremity edema noted  Imaging: US Paracentesis  Result Date: 08/17/2021 INDICATION: History of metastatic cancer with malignant ascites. Request for therapeutic paracentesis. EXAM: ULTRASOUND GUIDED THERAPEUTIC PARACENTESIS MEDICATIONS: 70ml 1% lidocaine COMPLICATIONS: None immediate. PROCEDURE: Informed written consent was obtained from the patient after a discussion of the risks, benefits and alternatives to treatment. A timeout was performed prior to the initiation of the procedure. Initial ultrasound scanning demonstrates a large amount of ascites within the left lower abdominal quadrant. The left lower abdomen was prepped and draped in the usual sterile fashion. 1% lidocaine was used for local anesthesia. Following this, a 19 gauge, 7-cm, Yueh catheter was introduced. An ultrasound image was saved for documentation purposes. The paracentesis was performed. The catheter was removed and a dressing was applied. The patient tolerated the procedure well without immediate post procedural complication. FINDINGS: A total of approximately 6.8 L  of cloudy, yellow fluid was removed. IMPRESSION: Successful ultrasound-guided paracentesis yielding 6.8 liters of peritoneal fluid. Read by: Narda Rutherford, NP Electronically Signed   By: Ruthann Cancer M.D.   On: 08/17/2021 07:40   [place] ASCITES (ABDOMEN LIMITED)  Result Date:  08/17/2021 CLINICAL DATA:  Malignant ascites, abdominal distension, assess for abdominal PleurX catheter EXAM: LIMITED ABDOMEN ULTRASOUND FOR ASCITES TECHNIQUE: Limited ultrasound survey for ascites was performed in all four abdominal quadrants. COMPARISON:  08/14/2021 FINDINGS: Small amount of diffuse abdominopelvic ascites throughout all 4 quadrants. IMPRESSION: Small volume of abdominopelvic ascites. Electronically Signed   By: Jerilynn Mages.  Shick M.D.   On: 08/17/2021 13:05    Labs:  CBC: Recent Labs    08/03/21 0355 08/04/21 0333 08/13/21 2350 08/17/21 1251  WBC 10.6* 13.4* 13.6* 12.7*  HGB 13.4 13.5 15.0 12.7  HCT 40.4 41.6 43.8 37.5  PLT 447* 489* 572* 389    COAGS: Recent Labs    08/13/21 2350  INR 1.2    BMP: Recent Labs    08/03/21 0355 08/04/21 0333 08/13/21 2350 08/16/21 0924  NA 126* 128* 122* 121*  K 3.9 3.9 5.1 3.8  CL 92* 94* 91* 92*  CO2 22 22 14* 15*  GLUCOSE 138* 141* 100* 133*  BUN 24* 23 58* 70*  CALCIUM 8.6* 8.3* 8.1* 8.6*  CREATININE 0.91 0.81 3.26* 1.45*  GFRNONAA >60 >60 14* 37*    LIVER FUNCTION TESTS: Recent Labs    07/29/21 0337 07/30/21 0321 08/02/21 0317 08/13/21 2350  BILITOT 0.5 0.4 0.4 0.7  AST 17  18 19 20   ALT 8 8 10 12   ALKPHOS 81 82 79 82  PROT 5.7* 5.7* 5.8* 6.4*  ALBUMIN 2.4* 2.4* 2.5* 2.7*    Assessment and Plan: Patient known to IR service from paracentesis x3, latest on 08/14/2021 yielding 7 L of fluid.  She has a past medical history of hyperlipidemia, hypertension, diabetes, acute kidney injury, protein calorie malnutrition, and newly diagnosed malignant ascites showing adenocarcinoma cells on cytology of presumed upper GI origin or pancreaticobiliary origin(stage IV).  Both patient and family have decided to pursue hospice and request now received for tunneled peritoneal drain placement to assist with ascites removal.  Case/imaging have been reviewed by Dr. Annamaria Boots.  She is afebrile, labs reviewed.  Details/risks of procedure  ,including but not limited to, internal bleeding, infection, injury to adjacent structures discussed with patient/daughters with their understanding and consent.  We will plan procedure for 11/1.   Electronically Signed: D. Rowe Robert, PA-C 08/17/2021, 2:01 PM   I spent a total of 25 minutes at the the patient's bedside AND on the patient's hospital floor or unit, greater than 50% of which was counseling/coordinating care for tunneled peritoneal drain placement    Patient ID: Amanda Gordon, female   DOB: 1941-05-06, 80 y.o.   MRN: [id number]

## 2021-08-17 NOTE — Progress Notes (Addendum)
PROGRESS NOTE    Amanda Gordon   ZOX:[id number]  DOB: [birth year]  DOA: 08/13/2021 PCP: Elias Else, MD   Brief Narrative:  Amanda Gordon a 80 y.o. 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  Patient recently admitted 07/24/2021-08/05/2021 for newly diagnosed malignant ascites.  Patient underwent paracentesis on 10/10 and 10/17.  Cytology and immunohistochemical markers were suggestive of upper GI source.  GI were consulted and patient underwent EGD on 10/14 which was unrevealing.  MRCP done 10/14 showed mild wall thickening in the region of the gastric antrum/duodenal bulb, poorly evaluated.  Biopsies from EGD were negative.  Of note, patient was last discharged on Lasix as it was causing mild creatinine elevation and hyponatremia while in hospital.     Subjective: No complaints.     Assessment & Plan:   Principal Problem:   Malignant ascites -  she underwent a third paracentesis on 10/29- a little over 6 L were removed -  plan for abdominal drain next week- IR will give an appointment to the patient - given 50 grams of Albumin after the paracentesis to prevent re-accumulation of fluid and hypotension - plan per IR is to place an abdominal drain tomorrow- she will be able to dc home afterwards   Active Problems: Adenocarcinoma of presumed upper GI or pancreaticobiliary origin   Goals of care, counseling/discussion - patient, daughter and oncology have decided to pursue hospice- we will see if she can go home with hospice- if not, we will pursue a hospice home- having ongoing discussion with patient, her daughter Tillman Abide and Dr Candise Che - we are awaiting a hoyer lift to be delivered to her home before she goes home with hospice      Poor oral intake/ moderate malnutrition - evidenced  by energy intake < or equal to 75% for > or equal to 1 month, moderate fat depletion, moderate muscle depletion - in setting of cancer- on Marinol- oral intake not yet improved   AKI with metabolic acidosis -due to poor oral intake and third spacing of fluid - Cr baseline is 0.81- Cr 1.45, Sodium 121, Bicarb 15  Lower back pain, musculoskeletal  - started Voltaren gel-  have ordered a K pad       Time spent in minutes: 35 DVT prophylaxis: enoxaparin (LOVENOX) injection 30 mg Start: 08/18/21 2200 Code Status: DNR Family Communication: daughter, Tillman Abide Level of Care: Level of care: Med-Surg Disposition Plan:  Status is: Inpatient  Remains inpatient appropriate because: awaiting safe transition to home with hospice  Consultants:  Palliative care oncology Procedures:  Paracentesis  Antimicrobials:  Anti-infectives (From admission, onward)    Start     Dose/Rate Route Frequency Ordered Stop   08/18/21 1400  ceFAZolin (ANCEF) IVPB 2g/100 mL premix        2 g 200 mL/hr over 30 Minutes Intravenous To Radiology 08/17/21 1419 08/19/21 1400        Objective: Vitals:   08/16/21 1953 08/16/21 2252 08/17/21 0501 08/17/21 1303  BP: 90/74 101/80 103/65 114/86  Pulse: (!) 110 100 (!) 103 94  Resp: 20 16 19 14   Temp: 98 F (36.7 C) (!) 97.4 F (36.3 C) 97.8 F (36.6 C) 97.9 F (36.6 C)  TempSrc: Oral Oral Oral Oral  SpO2: 96% 97% 98% 96%  Weight:      Height:  Intake/Output Summary (Last 24 hours) at 08/17/2021 1432 Last data filed at 08/16/2021 2000 Gross per 24 hour  Intake 240 ml  Output --  Net 240 ml    Filed Weights   08/13/21 2118 08/14/21 0624  Weight: 71.6 kg 74 kg    Examination: General exam: Appears comfortable  HEENT: PERRLA, oral mucosa moist, no sclera icterus or thrush Respiratory system: Clear to auscultation. Respiratory effort normal. Cardiovascular system: S1 & S2 heard, regular rate and rhythm Gastrointestinal system:  Abdomen soft, non-tender, moderately distended. Normal bowel sounds   Central nervous system: Alert and oriented. No focal neurological deficits. Extremities: No cyanosis, clubbing or edema Skin: No rashes or ulcers Psychiatry:  Mood & affect appropriate.      Data Reviewed: I have personally reviewed following labs and imaging studies  CBC: Recent Labs  Lab 08/13/21 2350 08/17/21 1251  WBC 13.6* 12.7*  NEUTROABS  --  11.1*  HGB 15.0 12.7  HCT 43.8 37.5  MCV 81.0 81.0  PLT 572* 389    Basic Metabolic Panel: Recent Labs  Lab 08/13/21 2350 08/16/21 0924  NA 122* 121*  K 5.1 3.8  CL 91* 92*  CO2 14* 15*  GLUCOSE 100* 133*  BUN 58* 70*  CREATININE 3.26* 1.45*  CALCIUM 8.1* 8.6*  MG 2.4  --     GFR: Estimated Creatinine Clearance: 30.3 mL/min (A) (by C-G formula based on SCr of 1.45 mg/dL (H)). Liver Function Tests: Recent Labs  Lab 08/13/21 2350  AST 20  ALT 12  ALKPHOS 82  BILITOT 0.7  PROT 6.4*  ALBUMIN 2.7*    No results for input(s): LIPASE, AMYLASE in the last 168 hours. No results for input(s): AMMONIA in the last 168 hours. Coagulation Profile: Recent Labs  Lab 08/13/21 2350  INR 1.2    Cardiac Enzymes: No results for input(s): CKTOTAL, CKMB, CKMBINDEX, TROPONINI in the last 168 hours. BNP (last 3 results) No results for input(s): PROBNP in the last 8760 hours. HbA1C: No results for input(s): HGBA1C in the last 72 hours. CBG: Recent Labs  Lab 08/16/21 0800 08/16/21 1240 08/16/21 1623 08/17/21 0744 08/17/21 1206  GLUCAP 134* 157* 159* 181* 153*    Lipid Profile: No results for input(s): CHOL, HDL, LDLCALC, TRIG, CHOLHDL, LDLDIRECT in the last 72 hours. Thyroid Function Tests: No results for input(s): TSH, T4TOTAL, FREET4, T3FREE, THYROIDAB in the last 72 hours. Anemia Panel: No results for input(s): VITAMINB12, FOLATE, FERRITIN, TIBC, IRON, RETICCTPCT in the last 72 hours. Urine analysis:    Component Value Date/Time    COLORURINE STRAW (A) 07/26/2021 2130   APPEARANCEUR CLEAR 07/26/2021 2130   LABSPEC 1.004 (L) 07/26/2021 2130   PHURINE 7.0 07/26/2021 2130   GLUCOSEU NEGATIVE 07/26/2021 2130   HGBUR MODERATE (A) 07/26/2021 2130   BILIRUBINUR NEGATIVE 07/26/2021 2130   KETONESUR NEGATIVE 07/26/2021 2130   PROTEINUR NEGATIVE 07/26/2021 2130   NITRITE NEGATIVE 07/26/2021 2130   LEUKOCYTESUR NEGATIVE 07/26/2021 2130   Sepsis Labs: @LABRCNTIP (procalcitonin:4,lacticidven:4) ) Recent Results (from the past 240 hour(s))  Resp Panel by RT-PCR (Flu A&B, Covid) Nasopharyngeal Swab     Status: None   Collection Time: 08/13/21 12:18 AM   Specimen: Nasopharyngeal Swab; Nasopharyngeal(NP) swabs in vial transport medium  Result Value Ref Range Status   SARS Coronavirus 2 by RT PCR NEGATIVE NEGATIVE Final    Comment: (NOTE) SARS-CoV-2 target nucleic acids are NOT DETECTED.  The SARS-CoV-2 RNA is generally detectable in upper respiratory specimens during the acute phase of infection. The  lowest concentration of SARS-CoV-2 viral copies this assay can detect is 138 copies/mL. A negative result does not preclude SARS-Cov-2 infection and should not be used as the sole basis for treatment or other patient management decisions. A negative result may occur with  improper specimen collection/handling, submission of specimen other than nasopharyngeal swab, presence of viral mutation(s) within the areas targeted by this assay, and inadequate number of viral copies(<138 copies/mL). A negative result must be combined with clinical observations, patient history, and epidemiological information. The expected result is Negative.  Fact Sheet for Patients:  BloggerCourse.com  Fact Sheet for Healthcare Providers:  SeriousBroker.it  This test is no t yet approved or cleared by the [place] FDA and  has been authorized for detection and/or diagnosis of SARS-CoV-2 by FDA  under an Emergency Use Authorization (EUA). This EUA will remain  in effect (meaning this test can be used) for the duration of the COVID-19 declaration under Section 564(b)(1) of the Act, 21 U.S.C.section 360bbb-3(b)(1), unless the authorization is terminated  or revoked sooner.       Influenza A by PCR NEGATIVE NEGATIVE Final   Influenza B by PCR NEGATIVE NEGATIVE Final    Comment: (NOTE) The Xpert Xpress SARS-CoV-2/FLU/RSV plus assay is intended as an aid in the diagnosis of influenza from Nasopharyngeal swab specimens and should not be used as a sole basis for treatment. Nasal washings and aspirates are unacceptable for Xpert Xpress SARS-CoV-2/FLU/RSV testing.  Fact Sheet for Patients: BloggerCourse.com  Fact Sheet for Healthcare Providers: SeriousBroker.it  This test is not yet approved or cleared by the [place] FDA and has been authorized for detection and/or diagnosis of SARS-CoV-2 by FDA under an Emergency Use Authorization (EUA). This EUA will remain in effect (meaning this test can be used) for the duration of the COVID-19 declaration under Section 564(b)(1) of the Act, 21 U.S.C. section 360bbb-3(b)(1), unless the authorization is terminated or revoked.  Performed at [facility], [address], [place], [place] [address]          Radiology Studies: [place] ASCITES (ABDOMEN LIMITED)  Result Date: 08/17/2021 CLINICAL DATA:  Malignant ascites, abdominal distension, assess for abdominal PleurX catheter EXAM: LIMITED ABDOMEN ULTRASOUND FOR ASCITES TECHNIQUE: Limited ultrasound survey for ascites was performed in all four abdominal quadrants. COMPARISON:  08/14/2021 FINDINGS: Small amount of diffuse abdominopelvic ascites throughout all 4 quadrants. IMPRESSION: Small volume of abdominopelvic ascites. Electronically Signed   By: Judie Petit.  Shick M.D.   On: 08/17/2021 13:05      Scheduled Meds:   (feeding supplement) PROSource Plus  30 mL Oral TID BM   alum & mag hydroxide-simeth  30 mL Oral QID   diclofenac Sodium  4 g Topical QID   dronabinol  2.5 mg Oral BID AC   [START ON 08/18/2021] enoxaparin (LOVENOX) injection  30 mg Subcutaneous Q24H   influenza vaccine adjuvanted  0.5 mL Intramuscular Tomorrow-1000   metoCLOPramide (REGLAN) injection  10 mg Intravenous Q8H   multivitamin with minerals  1 tablet Oral Daily   pantoprazole  40 mg Oral BID AC   protein supplement  1 Scoop Oral TID WC   Continuous Infusions:  [START ON 08/18/2021]  ceFAZolin (ANCEF) IV       LOS: 2 days      Calvert Cantor, MD [facility] Pager: [email or web address] 08/17/2021, 2:32 PM

## 2021-08-18 ENCOUNTER — Inpatient Hospital Stay (HOSPITAL_COMMUNITY): Payer: Medicare Other

## 2021-08-18 ENCOUNTER — Ambulatory Visit (HOSPITAL_COMMUNITY)
Admission: RE | Admit: 2021-08-18 | Discharge: 2021-08-18 | Disposition: A | Discharge status: Home / Self Care | Payer: Medicare Other | Source: Ambulatory Visit | Attending: Hematology | Admitting: Hematology

## 2021-08-18 DIAGNOSIS — K7011 Alcoholic hepatitis with ascites: Secondary | ICD-10-CM | POA: Diagnosis not present

## 2021-08-18 DIAGNOSIS — R180 Malignant ascites: Secondary | ICD-10-CM | POA: Diagnosis not present

## 2021-08-18 HISTORY — PX: IR PERC TUN PERIT CATH WO PORT S&I /IMAG: IMG2327

## 2021-08-18 LAB — BASIC METABOLIC PANEL
Anion gap: 11 (ref 5–15)
BUN: 65 mg/dL — ABNORMAL HIGH (ref 8–23)
CO2: 19 mmol/L — ABNORMAL LOW (ref 22–32)
Calcium: 9.4 mg/dL (ref 8.9–10.3)
Chloride: 92 mmol/L — ABNORMAL LOW (ref 98–111)
Creatinine, Ser: 0.89 mg/dL (ref 0.44–1.00)
GFR, Estimated: >60 mL/min (ref >60)
Glucose, Bld: 129 mg/dL — ABNORMAL HIGH (ref 70–99)
Potassium: 4.3 mmol/L (ref 3.5–5.1)
Sodium: 122 mmol/L — ABNORMAL LOW (ref 135–145)

## 2021-08-18 LAB — GLUCOSE, CAPILLARY
Glucose-Capillary: 146 mg/dL — ABNORMAL HIGH (ref 70–99)
Glucose-Capillary: 161 mg/dL — ABNORMAL HIGH (ref 70–99)
Glucose-Capillary: 167 mg/dL — ABNORMAL HIGH (ref 70–99)
Glucose-Capillary: 169 mg/dL — ABNORMAL HIGH (ref 70–99)

## 2021-08-18 MED ORDER — CEFAZOLIN SODIUM-DEXTROSE 2-4 GM/100ML-% IV SOLN
INTRAVENOUS | Status: AC
  Administered 2021-08-18: 2 g via INTRAVENOUS
  Filled 2021-08-18: qty 100

## 2021-08-18 MED ORDER — ENOXAPARIN SODIUM 40 MG/0.4ML IJ SOSY: 40.0000 mg | PREFILLED_SYRINGE | INTRAMUSCULAR | Status: DC

## 2021-08-18 MED ORDER — MIDAZOLAM HCL 2 MG/2ML IJ SOLN
INTRAMUSCULAR | Status: AC | PRN
  Administered 2021-08-18 (×2): .5 mg via INTRAVENOUS

## 2021-08-18 MED ORDER — MIDAZOLAM HCL 2 MG/2ML IJ SOLN
INTRAMUSCULAR | Status: AC
  Filled 2021-08-18: qty 2

## 2021-08-18 MED ORDER — FENTANYL CITRATE (PF) 100 MCG/2ML IJ SOLN
INTRAMUSCULAR | Status: AC | PRN
  Administered 2021-08-18: 50 ug via INTRAVENOUS
  Administered 2021-08-18: 25 ug via INTRAVENOUS

## 2021-08-18 MED ORDER — FENTANYL CITRATE (PF) 100 MCG/2ML IJ SOLN
INTRAMUSCULAR | Status: AC
  Filled 2021-08-18: qty 2

## 2021-08-18 MED ORDER — LIDOCAINE HCL 1 % IJ SOLN
INTRAMUSCULAR | Status: AC
  Filled 2021-08-18: qty 20

## 2021-08-18 NOTE — Care Management Important Message (Signed)
Important Message  Patient Details IM Letter placed in the Patients room. Name: Amanda Gordon MRN: [id number] Date of Birth: 02-08-1941   Medicare Important Message Given:        Kerin Salen 08/18/2021, 12:30 PM

## 2021-08-18 NOTE — Progress Notes (Signed)
MEDICATION-RELATED CONSULT NOTE   IR Procedure Consult - Anticoagulant/Antiplatelet PTA/Inpatient Med List Review by Pharmacist    Procedure: Placement of a tunneled peritoneal drain via left lower abdominal approach.    Completed: 11/1 14:42  Post-Procedural bleeding risk per IR MD assessment:  Standard  Antithrombotic medications on inpatient or PTA profile prior to procedure:   Enoxaparin 30mg  daily (LD on 10/30), Enoxaparin 40mg  scheduled next dose on 11/1 at 22:00.  Recommended restart time per IR Post-Procedure Guidelines:  Day + 1 (Next AM)   Other considerations:    EBL: Trace Complications: None immediate  Plan:     Noted plan for discharge today to home with hospice Move Lovenox to begin on 11/2 at 10:00 if not discharged.    Gretta Arab PharmD, BCPS Clinical Pharmacist [facility] main pharmacy [phone] 08/18/2021 2:48 PM

## 2021-08-18 NOTE — Discharge Summary (Addendum)
Physician Discharge Summary  Amanda Gordon NWG:[id number] DOB: 04/01/1941 DOA: 08/13/2021  PCP: Amanda Gordon  Admit date: 08/13/2021 Discharge date: 08/18/2021  Admitted From: home  Disposition:  home    Recommendations for Outpatient Follow-up:  She will go home with hospice today  Home Health:  none  Discharge Condition:  stable   CODE STATUS:  DNR   Diet recommendation:  regular diet Consultations: Palliative care IR  Procedures/Studies: Paracentesis Abdominal drain   Discharge Diagnoses:  Principal Problem:   Malignant ascites Active Problems:   Hypertension   Hyperlipidemia   Type 2 diabetes mellitus (HCC)   Goals of care, counseling/discussion   Malnutrition of moderate degree    Brief Summary: Amanda Gordon a 80 y.o. 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  Patient recently admitted 07/24/2021-08/05/2021 for newly diagnosed malignant ascites.  Patient underwent paracentesis on 10/10 and 10/17.  Cytology and immunohistochemical markers were suggestive of upper GI source.  GI was consulted and patient underwent EGD on 10/14 which was unrevealing.  MRCP done 10/14 showed mild wall thickening in the region of the gastric antrum/duodenal bulb, poorly evaluated.  Biopsies from EGD were negative.  Of note, patient was briefly placed on Lasix but it was causing creatinine elevation and hyponatremia and was discontinued.  Hospital Course:  Principal Problem:   Malignant ascites - CEA CA 19-9 and CA125 elevated.  -  she underwent a third paracentesis on 10/29- a little over 6 L were removed - given 50 grams of Albumin after the paracentesis to prevent re-accumulation of fluid and hypotension - abdominal drain has been placed today     Active Problems: Adenocarcinoma of  presumed upper GI or pancreaticobiliary origin   Goals of care, counseling/discussion - patient, daughter and oncology have decided to pursue hospice- we will see if she can go home with hospice- if not, we will pursue a hospice home- having ongoing discussion with patient, her daughter Amanda Gordon and Amanda Gordon - A hoyer lift has been delivered to her home  - she is going home with hospice today     Poor oral intake/ moderate malnutrition -evidenced by energy intake < or equal to 75% for > or equal to 1 month, moderate fat depletion, moderate muscle depletion - in setting of cancer- on Marinol- oral intake not yet improved   AKI with metabolic acidosis -due to poor oral intake and third spacing of fluid - Cr baseline is 0.81- Cr was 3.26 - improved to 1.45, Sodium 121, Bicarb 15 - given another 50 gr of Albumin again on 10/31- Cr now improved to 0.89, sodium to 122 and Bicarb to 19- GFR now > 60  DM2 - barely eating- Metformin on hold   Lower back pain, musculoskeletal  - have started Voltaren gel which appears to be helping    Discharge Exam: Vitals:   08/18/21 1330 08/18/21 1411  BP: 116/80 99/79  Pulse: (!) 104 100  Resp: 10 18  Temp:  (!) 97.5 F (36.4 C)  SpO2: 97% 94%   Vitals:   08/18/21 1320 08/18/21 1325 08/18/21 1330 08/18/21 1411  BP: 116/79 106/79 116/80 99/79  Pulse:   (!) 104 100  Resp:   10 18  Temp:    (!) 97.5 F (36.4 C)  TempSrc:    Oral  SpO2:   97% 94%  Weight:      Height:        General: Pt is alert, awake, not in acute distress Cardiovascular: RRR, S1/S2 +, no rubs, no gallops Respiratory: CTA bilaterally, no wheezing, no rhonchi Abdominal: Soft, NT, ND, bowel sounds + Extremities: no edema, no cyanosis   Discharge Instructions  Discharge Instructions     Increase activity slowly   Complete by: As directed    Increase activity slowly   Complete by: As directed    Increase activity slowly   Complete by: As directed        Allergies as of 08/18/2021       Reactions   Codeine Nausea And Vomiting   "Dizziness"        Medication List     STOP taking these medications    atorvastatin 40 MG tablet Commonly known as: LIPITOR   megestrol 40 MG tablet Commonly known as: MEGACE   metFORMIN 500 MG tablet Commonly known as: GLUCOPHAGE   metoprolol succinate 25 MG 24 hr tablet Commonly known as: Toprol XL       TAKE these medications    (feeding supplement) PROSource Plus liquid Take 30 mLs by mouth 3 (three) times daily between meals.   diclofenac Sodium 1 % Gel Commonly known as: VOLTAREN Apply 4 g topically 4 (four) times daily.   dronabinol 2.5 MG capsule Commonly known as: MARINOL Take 1 capsule (2.5 mg total) by mouth 2 (two) times daily before lunch and supper.   ibuprofen 200 MG tablet Commonly known as: ADVIL Take 600 mg by mouth every 6 (six) hours as needed for fever, headache or mild pain.   morphine 20 MG/ML concentrated solution Commonly known as: ROXANOL Take 0.25-0.5 mLs (5-10 mg total) by mouth every 4 (four) hours as needed for severe pain.   omeprazole 20 MG capsule Commonly known as: PRILOSEC Take 20 mg by mouth daily.   ondansetron 4 MG disintegrating tablet Commonly known as: ZOFRAN-ODT Take 1 tablet (4 mg total) by mouth every 8 (eight) hours as needed for nausea or vomiting.   prochlorperazine 5 MG tablet Commonly known as: COMPAZINE Take 5 mg by mouth every 6 (six) hours as needed for nausea or vomiting.   senna-docusate 8.6-50 MG tablet Commonly known as: Senokot-S Take 1 tablet by mouth at bedtime as needed for mild constipation.   traZODone 50 MG tablet Commonly known as: DESYREL Take 1 tablet (50 mg total) by mouth at bedtime as needed for sleep.   trolamine salicylate 10 % cream Commonly known as: ASPERCREME Apply 1 application topically as needed for muscle pain.        Follow-up Information     [facility]. Schedule an  appointment as soon as possible for a visit.   Why: [phone] Call on Monday to arrange placement of abdominal drain.               Allergies  Allergen Reactions   Codeine Nausea And Vomiting    "Dizziness"      CT CHEST W CONTRAST  Result Date: 07/29/2021 CLINICAL DATA:  Cancer of unknown primary. Staging CT scan. EXAM: CT CHEST WITH CONTRAST TECHNIQUE: Multidetector CT imaging of the chest was performed during intravenous contrast administration. CONTRAST:  60mL OMNIPAQUE IOHEXOL 350 MG/ML SOLN COMPARISON:  CT abdomen/pelvis 07/24/2021 FINDINGS: Cardiovascular: The heart is normal in size. No pericardial effusion. The aorta is normal in caliber. No dissection. Scattered atherosclerotic calcifications. The branch vessels are patent. Scattered coronary artery calcifications. Mediastinum/Nodes:  No mediastinal or hilar mass or lymphadenopathy. Small scattered lymph nodes are noted. The esophagus is unremarkable. Thyroid gland is unremarkable. Lungs/Pleura: Bilateral pleural effusions, moderate on the right and mild on the left. No enhancing pleural nodules are identified. No pulmonary nodules or pulmonary lesions. Upper Abdomen: Upper abdominal ascites again demonstrated. Calcified granulomas are noted in the spleen. Musculoskeletal: No breast masses, supraclavicular or axillary adenopathy. The bony thorax is intact. Benign T8 hemangioma noted. IMPRESSION: 1. Bilateral pleural effusions, moderate on the right and mild on the left. 2. No mediastinal or hilar mass or adenopathy. 3. No pulmonary nodules or pulmonary lesions. 4. Upper abdominal ascites. Electronically Signed   By: Rudie Meyer M.D.   On: 07/29/2021 12:28   US PELVIS TRANSVAGINAL NON-OB (TV ONLY)  Result Date: 07/26/2021 CLINICAL DATA:  80 year old female inpatient with a history of hysterectomy and ascites and right adnexal cystic lesion on recent CT. EXAM: ULTRASOUND PELVIS TRANSVAGINAL TECHNIQUE: Transvaginal ultrasound  examination of the pelvis was performed including evaluation of the uterus, ovaries, adnexal regions, and pelvic cul-de-sac. COMPARISON:  07/24/2021 CT abdomen/pelvis. FINDINGS: Uterus Surgically absent. Right ovary Measurements: 4.6 x 4.4 x 3.5 cm = volume: 37 mL. There is limited visualization of a 4.2 x 3.9 x 2.8 cm right ovarian cyst with questionable mildly thickened eccentric septation, and no internal vascularity on color Doppler or discrete mural nodularity. Left ovary Nonvisualization of the left ovary.  No left adnexal masses. Other findings:  No abnormal free fluid IMPRESSION: 1. Limited visualization of a 4.2 cm right ovarian cyst, which appears mildly complex with mildly thickened eccentric septation. This cystic lesion is indeterminate for malignancy particularly given the other worrisome findings of large volume ascites and possible omental caking on the recent CT. 2. No abnormal findings at the hysterectomy margin. 3. Nonvisualization of the left ovary.  No left adnexal masses. Electronically Signed   By: Delbert Phenix M.D.   On: 07/26/2021 12:59   CT Abdomen Pelvis W Contrast  Result Date: 07/24/2021 CLINICAL DATA:  Abdominal distension, nausea. EXAM: CT ABDOMEN AND PELVIS WITH CONTRAST TECHNIQUE: Multidetector CT imaging of the abdomen and pelvis was performed using the standard protocol following bolus administration of intravenous contrast. CONTRAST:  75mL OMNIPAQUE IOHEXOL 350 MG/ML SOLN COMPARISON:  None. FINDINGS: Lower chest: Small bilateral pleural effusions are noted with adjacent subsegmental atelectasis. Hepatobiliary: No focal liver abnormality is seen. Status post cholecystectomy. No biliary dilatation. Pancreas: Unremarkable. No pancreatic ductal dilatation or surrounding inflammatory changes. Spleen: Calcified splenic granulomata are noted. Adrenals/Urinary Tract: Adrenal glands are unremarkable. Kidneys are normal, without renal calculi, focal lesion, or hydronephrosis. Bladder is  unremarkable. Stomach/Bowel: Stomach appears normal. There is no evidence of bowel obstruction or inflammation. Status post appendectomy. Sigmoid diverticulosis is noted without inflammation. Vascular/Lymphatic: Aortic atherosclerosis. Mildly enlarged retroperitoneal and mesenteric lymph nodes are noted which may be inflammatory in etiology, but malignancy or metastatic disease cannot be excluded. Largest lymph node measures 13 mm in right periaortic region. Reproductive: Status post hysterectomy. 3.8 cm right ovarian cystic abnormality is noted. No definite left adnexal abnormality is noted. Other: Moderate ascites is noted. Mild irregular densities are seen involving the omentum concerning for possible peritoneal carcinomatosis. Musculoskeletal: No acute or significant osseous findings. IMPRESSION: Moderate ascites is noted. Mildly enlarged retroperitoneal mesenteric lymph nodes are noted, the largest measuring 13 mm in the right periaortic region. While these may be inflammatory in etiology, malignancy or metastatic disease cannot be excluded. Mild irregular densities are seen involving the omentum which may  represent inflammation, but peritoneal carcinomatosis cannot be excluded. Status post hysterectomy. 3.8 cm right ovarian cyst is noted. Recommend follow-up US in 6-12 months. Note: This recommendation does not apply to premenarchal patients and to those with increased risk (genetic, family history, elevated tumor markers or other high-risk factors) of ovarian cancer. Reference: JACR 2020 Feb; 17(2):248-254. Sigmoid diverticulosis without inflammation. Small bilateral pleural effusions with minimal adjacent subsegmental atelectasis. Aortic Atherosclerosis (ICD10-I70.0). Electronically Signed   By: Lupita Raider M.D.   On: 07/24/2021 15:43   MR 3D Recon At Scanner  Result Date: 07/31/2021 CLINICAL DATA:  Adenocarcinoma of unknown primary, upper GI primary favored. EXAM: MRI ABDOMEN WITHOUT AND WITH  CONTRAST (INCLUDING MRCP) TECHNIQUE: Multiplanar multisequence MR imaging of the abdomen was performed both before and after the administration of intravenous contrast. Heavily T2-weighted images of the biliary and pancreatic ducts were obtained, and three-dimensional MRCP images were rendered by post processing. CONTRAST:  8mL GADAVIST GADOBUTROL 1 MMOL/ML IV SOLN COMPARISON:  CT abdomen/pelvis dated 07/24/2021 FINDINGS: Extremely motion degraded images leading to markedly limited evaluation. Lower chest: Moderate right and small left pleural effusions. Associated lower lobe atelectasis. Hepatobiliary: No focal hepatic lesion is seen. Status post cholecystectomy. No intrahepatic or extrahepatic ductal dilatation. Pancreas:  No parenchymal atrophy or ductal dilatation. Spleen:  Grossly unremarkable. Adrenals/Urinary Tract:  Adrenal glands are within normal limits. Kidneys are grossly unremarkable.  No hydronephrosis. Stomach/Bowel: Mild wall thickening in the region of the gastric antrum/duodenal bulb, poorly evaluated. The visualized bowel is grossly unremarkable. Vascular/Lymphatic:  No evidence of abdominal aortic aneurysm. Small upper abdominal/retroperitoneal lymph nodes, better evaluated on CT. 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 Musculoskeletal: No focal osseous lesions. IMPRESSION: Markedly limited evaluation due to motion degradation. 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 Small upper abdominal/retroperitoneal lymph nodes, better evaluated on CT. Mild wall thickening in the region of the gastric antrum/duodenal bulb, poorly evaluated. Correlate with recent endoscopy. Moderate right and small left pleural effusions. Associated lower lobe atelectasis. Electronically Signed   By: Charline Bills M.D.   On: 07/31/2021 21:21   US Paracentesis  Result Date: 08/17/2021 INDICATION: History of metastatic cancer with malignant ascites. Request  for therapeutic paracentesis. EXAM: ULTRASOUND GUIDED THERAPEUTIC PARACENTESIS MEDICATIONS: 10ml 1% lidocaine COMPLICATIONS: None immediate. PROCEDURE: Informed written consent was obtained from the patient after a discussion of the risks, benefits and alternatives to treatment. A timeout was performed prior to the initiation of the procedure. Initial ultrasound scanning demonstrates a large amount of ascites within the left lower abdominal quadrant. The left lower abdomen was prepped and draped in the usual sterile fashion. 1% lidocaine was used for local anesthesia. Following this, a 19 gauge, 7-cm, Yueh catheter was introduced. An ultrasound image was saved for documentation purposes. The paracentesis was performed. The catheter was removed and a dressing was applied. The patient tolerated the procedure well without immediate post procedural complication. FINDINGS: A total of approximately 6.8 L  of cloudy, yellow fluid was removed. IMPRESSION: Successful ultrasound-guided paracentesis yielding 6.8 liters of peritoneal fluid. Read by: Alex Gardener, NP Electronically Signed   By: Marliss Coots M.D.   On: 08/17/2021 07:40   US Paracentesis  Result Date: 08/03/2021 INDICATION: History of metastatic cancer with malignant ascites. Request for therapeutic paracentesis EXAM: ULTRASOUND GUIDED THERAPEUTIC PARACENTESIS MEDICATIONS: 10 mL 1% lidocaine COMPLICATIONS: None immediate. PROCEDURE: Informed written consent was obtained from the patient after a discussion of the risks, benefits and alternatives to treatment. A timeout  was performed prior to the initiation of the procedure. Initial ultrasound scanning demonstrates a large amount of ascites within the left lower abdominal quadrant. The left lower abdomen was prepped and draped in the usual sterile fashion. 1% lidocaine was used for local anesthesia. Following this, a 6 Fr Safe-T-Centesis catheter was introduced. An ultrasound image was saved for documentation  purposes. The paracentesis was performed. The catheter was removed and a dressing was applied. The patient tolerated the procedure well without immediate post procedural complication. FINDINGS: A total of approximately 6 L of clear, yellow fluid was removed. IMPRESSION: Successful ultrasound-guided paracentesis yielding 6 liters of peritoneal fluid. Read by: Alex Gardener, NP Electronically Signed   By: Roanna Banning M.D.   On: 08/03/2021 15:35   US Paracentesis  Result Date: 07/25/2021 INDICATION: Concern for intra-abdominal malignancy, now with symptomatic ascites. Please perform ultrasound-guided paracentesis for diagnostic and therapeutic purposes. Max paracentesis volume of 5 L requested. EXAM: ULTRASOUND GUIDED DIAGNOSTIC AND THERAPEUTIC PARACENTESIS COMPARISON:  CT abdomen and pelvis-earlier same day MEDICATIONS: None. COMPLICATIONS: None immediate. TECHNIQUE: Informed written consent was obtained from the patient after a discussion of the risks, benefits and alternatives to treatment. A timeout was performed prior to the initiation of the procedure. Initial ultrasound scanning demonstrates a large amount of ascites within the left lower abdomen which was subsequently prepped and draped in the usual sterile fashion. 1% lidocaine with epinephrine was used for local anesthesia. An ultrasound image was saved for documentation purposed. An 8 Fr Safe-T-Centesis catheter was introduced. The paracentesis was performed. The catheter was removed and a dressing was applied. The patient tolerated the procedure well without immediate post procedural complication. FINDINGS: A total of approximately 5 liters of serous fluid was removed. Samples were sent to the laboratory as requested by the clinical team. IMPRESSION: Successful ultrasound-guided paracentesis yielding 5 liters of peritoneal fluid. Electronically Signed   By: Simonne Come M.D.   On: 07/25/2021 08:18   MR ABDOMEN MRCP W WO CONTAST  Result Date:  07/31/2021 CLINICAL DATA:  Adenocarcinoma of unknown primary, upper GI primary favored. EXAM: MRI ABDOMEN WITHOUT AND WITH CONTRAST (INCLUDING MRCP) TECHNIQUE: Multiplanar multisequence MR imaging of the abdomen was performed both before and after the administration of intravenous contrast. Heavily T2-weighted images of the biliary and pancreatic ducts were obtained, and three-dimensional MRCP images were rendered by post processing. CONTRAST:  8mL GADAVIST GADOBUTROL 1 MMOL/ML IV SOLN COMPARISON:  CT abdomen/pelvis dated 07/24/2021 FINDINGS: Extremely motion degraded images leading to markedly limited evaluation. Lower chest: Moderate right and small left pleural effusions. Associated lower lobe atelectasis. Hepatobiliary: No focal hepatic lesion is seen. Status post cholecystectomy. No intrahepatic or extrahepatic ductal dilatation. Pancreas:  No parenchymal atrophy or ductal dilatation. Spleen:  Grossly unremarkable. Adrenals/Urinary Tract:  Adrenal glands are within normal limits. Kidneys are grossly unremarkable.  No hydronephrosis. Stomach/Bowel: Mild wall thickening in the region of the gastric antrum/duodenal bulb, poorly evaluated. The visualized bowel is grossly unremarkable. Vascular/Lymphatic:  No evidence of abdominal aortic aneurysm. Small upper abdominal/retroperitoneal lymph nodes, better evaluated on CT. 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 Musculoskeletal: No focal osseous lesions. IMPRESSION: Markedly limited evaluation due to motion degradation. 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 Small upper abdominal/retroperitoneal lymph nodes, better evaluated on CT. Mild wall thickening in the region of the gastric antrum/duodenal bulb, poorly evaluated. Correlate with recent endoscopy. Moderate right and small left pleural effusions. Associated lower lobe atelectasis. Electronically Signed   By: Lurlean Horns  Rito Ehrlich M.D.   On: 07/31/2021 21:21    [place] ASCITES (ABDOMEN LIMITED)  Result Date: 08/17/2021 CLINICAL DATA:  Malignant ascites, abdominal distension, assess for abdominal PleurX catheter EXAM: LIMITED ABDOMEN ULTRASOUND FOR ASCITES TECHNIQUE: Limited ultrasound survey for ascites was performed in all four abdominal quadrants. COMPARISON:  08/14/2021 FINDINGS: Small amount of diffuse abdominopelvic ascites throughout all 4 quadrants. IMPRESSION: Small volume of abdominopelvic ascites. Electronically Signed   By: Judie Petit.  Shick M.D.   On: 08/17/2021 13:05   [place] ASCITES (ABDOMEN LIMITED)  Result Date: 07/27/2021 CLINICAL DATA:  Patient is complaining of leaking at previous paracentesis puncture site. EXAM: LIMITED ABDOMEN ULTRASOUND FOR ASCITES TECHNIQUE: Limited ultrasound survey for ascites was performed in all four abdominal quadrants. COMPARISON:  07/24/2021 FINDINGS: Small to moderate amount of ascites in the right upper quadrant and right lower quadrant. Minimal ascites in left upper quadrant. No significant ascites in left lower quadrant. IMPRESSION: Small to moderate amount of ascites in the right abdomen. Paracentesis not performed. Electronically Signed   By: Richarda Overlie M.D.   On: 07/27/2021 13:32     The results of significant diagnostics from this hospitalization (including imaging, microbiology, ancillary and laboratory) are listed below for reference.     Microbiology: Recent Results (from the past 240 hour(s))  Resp Panel by RT-PCR (Flu A&B, Covid) Nasopharyngeal Swab     Status: None   Collection Time: 08/13/21 12:18 AM   Specimen: Nasopharyngeal Swab; Nasopharyngeal(NP) swabs in vial transport medium  Result Value Ref Range Status   SARS Coronavirus 2 by RT PCR NEGATIVE NEGATIVE Final    Comment: (NOTE) SARS-CoV-2 target nucleic acids are NOT DETECTED.  The SARS-CoV-2 RNA is generally detectable in upper respiratory specimens during the acute phase of infection. The lowest concentration of SARS-CoV-2 viral copies this  assay can detect is 138 copies/mL. A negative result does not preclude SARS-Cov-2 infection and should not be used as the sole basis for treatment or other patient management decisions. A negative result may occur with  improper specimen collection/handling, submission of specimen other than nasopharyngeal swab, presence of viral mutation(s) within the areas targeted by this assay, and inadequate number of viral copies(<138 copies/mL). A negative result must be combined with clinical observations, patient history, and epidemiological information. The expected result is Negative.  Fact Sheet for Patients:  BloggerCourse.com  Fact Sheet for Healthcare Providers:  SeriousBroker.it  This test is no t yet approved or cleared by the [place] FDA and  has been authorized for detection and/or diagnosis of SARS-CoV-2 by FDA under an Emergency Use Authorization (EUA). This EUA will remain  in effect (meaning this test can be used) for the duration of the COVID-19 declaration under Section 564(b)(1) of the Act, 21 U.S.C.section 360bbb-3(b)(1), unless the authorization is terminated  or revoked sooner.       Influenza A by PCR NEGATIVE NEGATIVE Final   Influenza B by PCR NEGATIVE NEGATIVE Final    Comment: (NOTE) The Xpert Xpress SARS-CoV-2/FLU/RSV plus assay is intended as an aid in the diagnosis of influenza from Nasopharyngeal swab specimens and should not be used as a sole basis for treatment. Nasal washings and aspirates are unacceptable for Xpert Xpress SARS-CoV-2/FLU/RSV testing.  Fact Sheet for Patients: BloggerCourse.com  Fact Sheet for Healthcare Providers: SeriousBroker.it  This test is not yet approved or cleared by the [place] FDA and has been authorized for detection and/or diagnosis of SARS-CoV-2 by FDA under an Emergency Use Authorization (EUA). This EUA will  remain in  effect (meaning this test can be used) for the duration of the COVID-19 declaration under Section 564(b)(1) of the Act, 21 U.S.C. section 360bbb-3(b)(1), unless the authorization is terminated or revoked.  Performed at [facility], [address], [place], [place] [address]      Labs: BNP (last 3 results) No results for input(s): BNP in the last 8760 hours. Basic Metabolic Panel: Recent Labs  Lab 08/13/21 2350 08/16/21 0924 08/18/21 0505  NA 122* 121* 122*  K 5.1 3.8 4.3  CL 91* 92* 92*  CO2 14* 15* 19*  GLUCOSE 100* 133* 129*  BUN 58* 70* 65*  CREATININE 3.26* 1.45* 0.89  CALCIUM 8.1* 8.6* 9.4  MG 2.4  --   --    Liver Function Tests: Recent Labs  Lab 08/13/21 2350  AST 20  ALT 12  ALKPHOS 82  BILITOT 0.7  PROT 6.4*  ALBUMIN 2.7*   No results for input(s): LIPASE, AMYLASE in the last 168 hours. No results for input(s): AMMONIA in the last 168 hours. CBC: Recent Labs  Lab 08/13/21 2350 08/17/21 1251  WBC 13.6* 12.7*  NEUTROABS  --  11.1*  HGB 15.0 12.7  HCT 43.8 37.5  MCV 81.0 81.0  PLT 572* 389   Cardiac Enzymes: No results for input(s): CKTOTAL, CKMB, CKMBINDEX, TROPONINI in the last 168 hours. BNP: Invalid input(s): POCBNP CBG: Recent Labs  Lab 08/17/21 1206 08/17/21 1629 08/17/21 2044 08/18/21 0742 08/18/21 1200  GLUCAP 153* 128* 134* 146* 161*   D-Dimer No results for input(s): DDIMER in the last 72 hours. Hgb A1c No results for input(s): HGBA1C in the last 72 hours. Lipid Profile No results for input(s): CHOL, HDL, LDLCALC, TRIG, CHOLHDL, LDLDIRECT in the last 72 hours. Thyroid function studies No results for input(s): TSH, T4TOTAL, T3FREE, THYROIDAB in the last 72 hours.  Invalid input(s): FREET3 Anemia work up No results for input(s): VITAMINB12, FOLATE, FERRITIN, TIBC, IRON, RETICCTPCT in the last 72 hours. Urinalysis    Component Value Date/Time   COLORURINE STRAW (A) 07/26/2021 2130    APPEARANCEUR CLEAR 07/26/2021 2130   LABSPEC 1.004 (L) 07/26/2021 2130   PHURINE 7.0 07/26/2021 2130   GLUCOSEU NEGATIVE 07/26/2021 2130   HGBUR MODERATE (A) 07/26/2021 2130   BILIRUBINUR NEGATIVE 07/26/2021 2130   KETONESUR NEGATIVE 07/26/2021 2130   PROTEINUR NEGATIVE 07/26/2021 2130   NITRITE NEGATIVE 07/26/2021 2130   LEUKOCYTESUR NEGATIVE 07/26/2021 2130   Sepsis Labs Invalid input(s): PROCALCITONIN,  WBC,  LACTICIDVEN Microbiology Recent Results (from the past 240 hour(s))  Resp Panel by RT-PCR (Flu A&B, Covid) Nasopharyngeal Swab     Status: None   Collection Time: 08/13/21 12:18 AM   Specimen: Nasopharyngeal Swab; Nasopharyngeal(NP) swabs in vial transport medium  Result Value Ref Range Status   SARS Coronavirus 2 by RT PCR NEGATIVE NEGATIVE Final    Comment: (NOTE) SARS-CoV-2 target nucleic acids are NOT DETECTED.  The SARS-CoV-2 RNA is generally detectable in upper respiratory specimens during the acute phase of infection. The lowest concentration of SARS-CoV-2 viral copies this assay can detect is 138 copies/mL. A negative result does not preclude SARS-Cov-2 infection and should not be used as the sole basis for treatment or other patient management decisions. A negative result may occur with  improper specimen collection/handling, submission of specimen other than nasopharyngeal swab, presence of viral mutation(s) within the areas targeted by this assay, and inadequate number of viral copies(<138 copies/mL). A negative result must be combined with clinical observations, patient history, and epidemiological information.  The expected result is Negative.  Fact Sheet for Patients:  BloggerCourse.com  Fact Sheet for Healthcare Providers:  SeriousBroker.it  This test is no t yet approved or cleared by the [place] FDA and  has been authorized for detection and/or diagnosis of SARS-CoV-2 by FDA under an Emergency  Use Authorization (EUA). This EUA will remain  in effect (meaning this test can be used) for the duration of the COVID-19 declaration under Section 564(b)(1) of the Act, 21 U.S.C.section 360bbb-3(b)(1), unless the authorization is terminated  or revoked sooner.       Influenza A by PCR NEGATIVE NEGATIVE Final   Influenza B by PCR NEGATIVE NEGATIVE Final    Comment: (NOTE) The Xpert Xpress SARS-CoV-2/FLU/RSV plus assay is intended as an aid in the diagnosis of influenza from Nasopharyngeal swab specimens and should not be used as a sole basis for treatment. Nasal washings and aspirates are unacceptable for Xpert Xpress SARS-CoV-2/FLU/RSV testing.  Fact Sheet for Patients: BloggerCourse.com  Fact Sheet for Healthcare Providers: SeriousBroker.it  This test is not yet approved or cleared by the [place] FDA and has been authorized for detection and/or diagnosis of SARS-CoV-2 by FDA under an Emergency Use Authorization (EUA). This EUA will remain in effect (meaning this test can be used) for the duration of the COVID-19 declaration under Section 564(b)(1) of the Act, 21 U.S.C. section 360bbb-3(b)(1), unless the authorization is terminated or revoked.  Performed at [facility], [address], [place], [place] [address]      Time coordinating discharge in minutes: 65  SIGNED:   Calvert Cantor, Gordon  [facility] 08/18/2021, 2:31 PM

## 2021-08-18 NOTE — TOC Transition Note (Signed)
Transition of Care [facility]) - CM/SW Discharge Note   Patient Details  Name: Amanda Gordon MRN: [id number] Date of Birth: Dec 06, 1940  [facility]) CM/SW Contact:  Lynnell Catalan, RN Phone Number: 08/18/2021, 3:27 PM   Clinical Narrative:    Pt to dc home with hospice today via PTAR. 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 Spoke with [facility] liaison to ensure that they would teach family how to preform drain care and drainage of catheter. Family sent home with a box of 10 Pleurx drainage canisters and order form for more canisters if needed. Per pt daughter [facility] RN to see the pt at home tomorrow at [place] contacted for transport. Yellow DNR form on the chart for transport.  Final next level of care: Home w Hospice Care Barriers to Discharge: Continued Medical Work up   Discharge Plan and [place] Date [place]: 08/16/21 Time [place]: 17 Representative spoke with at [place]: [place] ([place]) Interventions     Readmission Risk Interventions No flowsheet data found.

## 2021-08-18 NOTE — Procedures (Signed)
Pre procedural Dx: Recurrent ascites Post procedural Dx: Same  Technically successful image guided placement of a tunneled peritoneal drain via left lower abdominal approach. Approximately 2.6 L of ascitic fluid aspirated following drain placement.   EBL: Trace Complications: None immediate  Ronny Bacon, MD Pager #: [phone]

## 2021-08-19 NOTE — Progress Notes (Signed)
Discharged patient home with family and hospice, via PTAR, discharge instructions reviewed with transport, and all questions were answered.  PIV removed per policy.

## 2021-08-20 DIAGNOSIS — C786 Secondary malignant neoplasm of retroperitoneum and peritoneum: Secondary | ICD-10-CM | POA: Insufficient documentation

## 2021-09-17 DEATH — deceased

## 2022-08-30 IMAGING — CT CT ABD-PELV W/ CM
3 of 5 series · 16 of 46 positions shown, 18 images · IV contrast (OMNIPAQUE 350)
Comparison: None.

CLINICAL DATA: Abdominal distension, nausea.

EXAM:
CT ABDOMEN AND PELVIS WITH CONTRAST
TECHNIQUE: Multidetector CT imaging of the abdomen and pelvis was performed
using the standard protocol following bolus administration of
intravenous contrast.
CONTRAST:  75mL OMNIPAQUE IOHEXOL 350 MG/ML SOLN

[Series 2: axial st · axial · 0.87mm/px · z∈[+1094,+1494]mm · 11 of 96 slices shown, 13 images]
[im 8/96  soft-tissue]
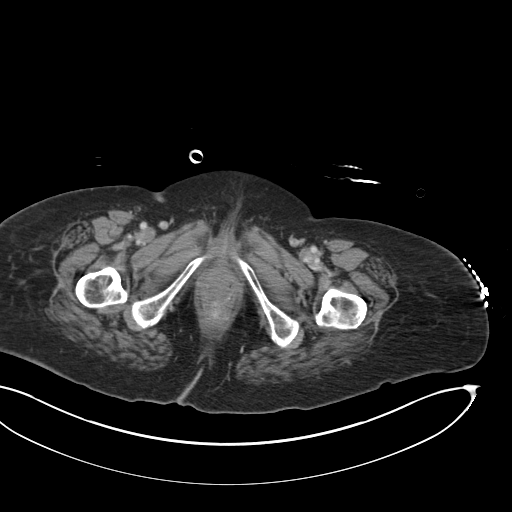
[im 8/96  bone]
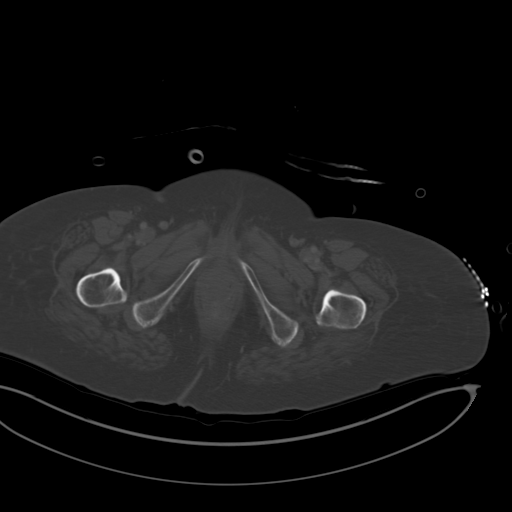
[im 16/96  soft-tissue]
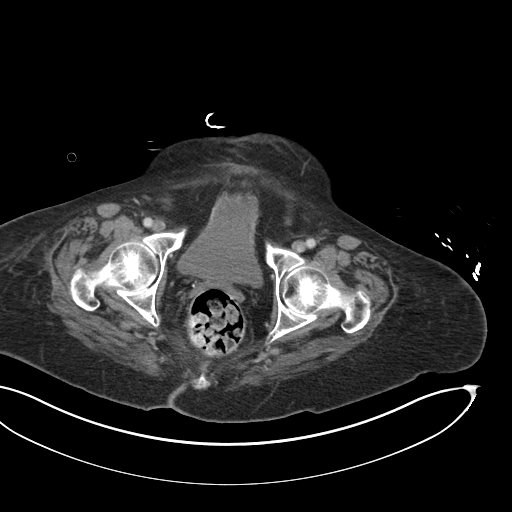
[im 24/96  soft-tissue]
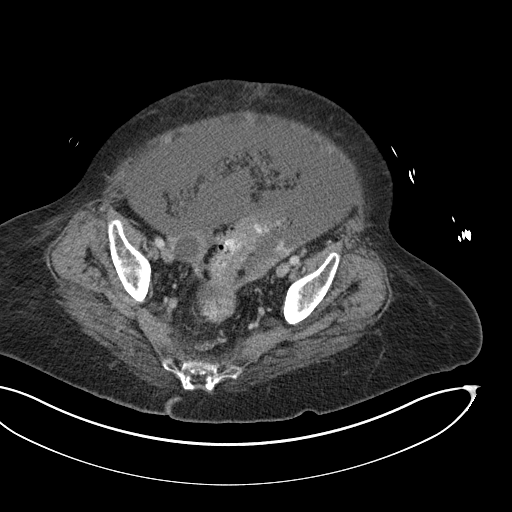
[im 32/96  soft-tissue]
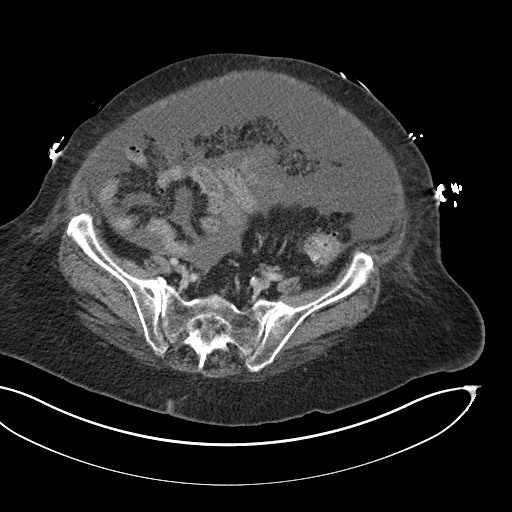
[im 40/96  soft-tissue]
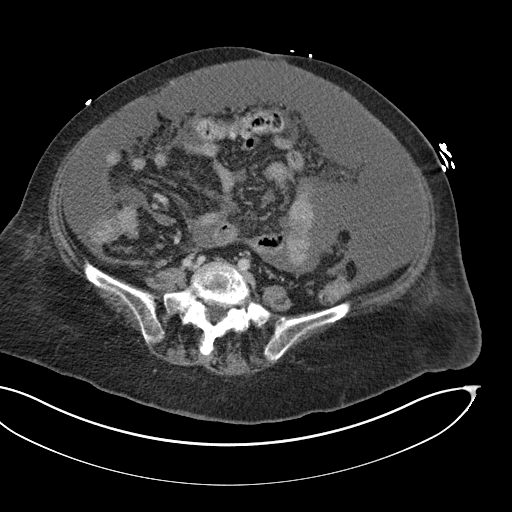
[im 48/96  soft-tissue]
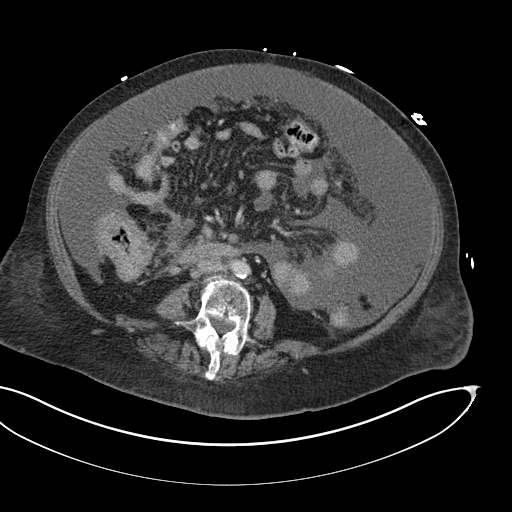
[im 56/96  soft-tissue]
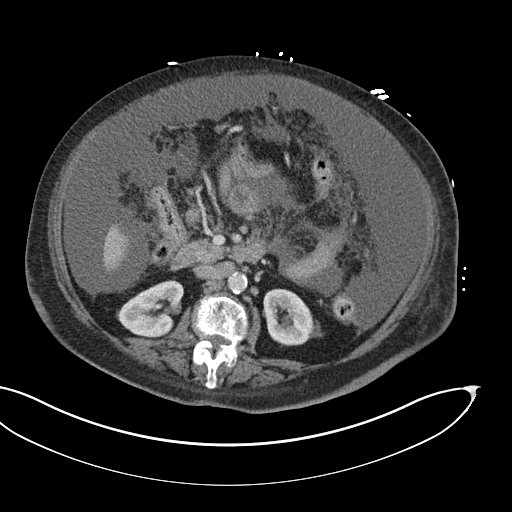
[im 64/96  soft-tissue]
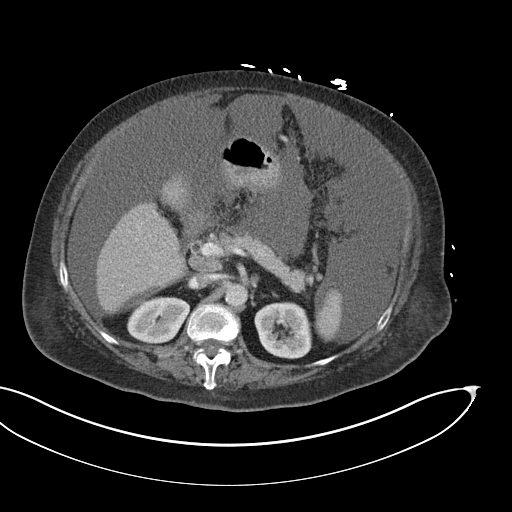
[im 72/96  soft-tissue]
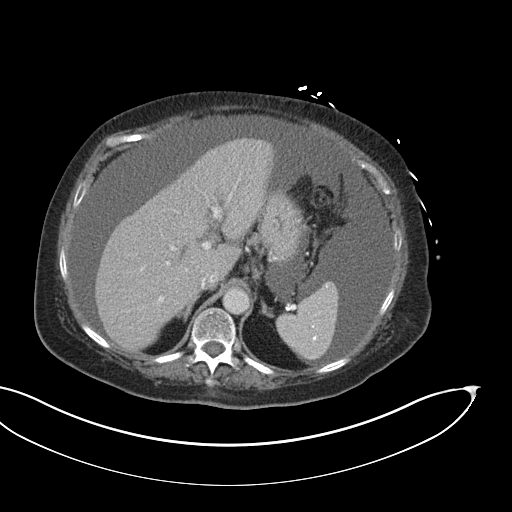
[im 72/96  bone]
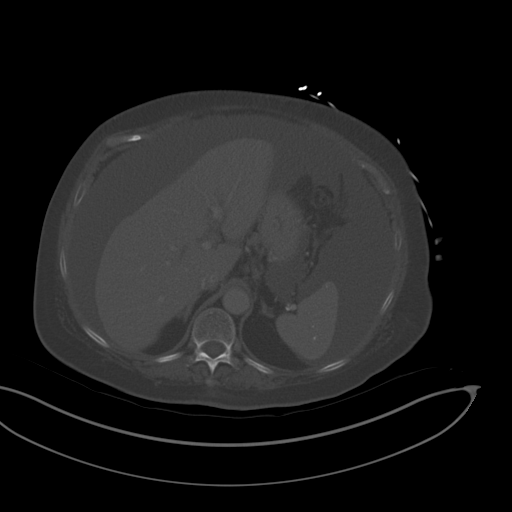
[im 80/96  soft-tissue]
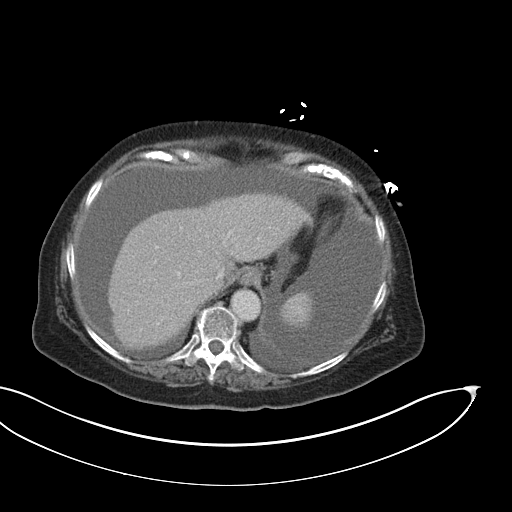
[im 88/96  soft-tissue]
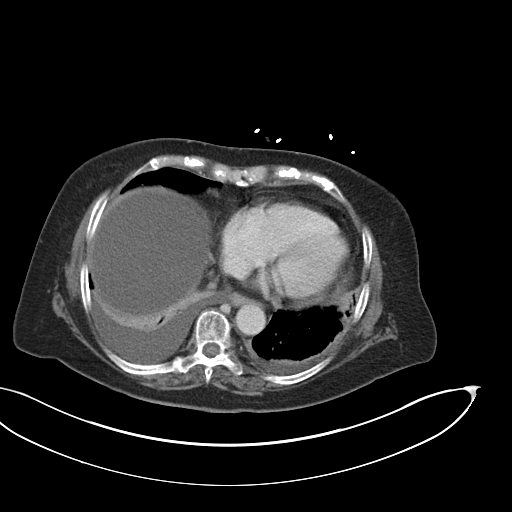

[Series 4: coronal st · coronal · 0.90mm/px · 3 of 176 slices shown]
[im 59/176  soft-tissue]
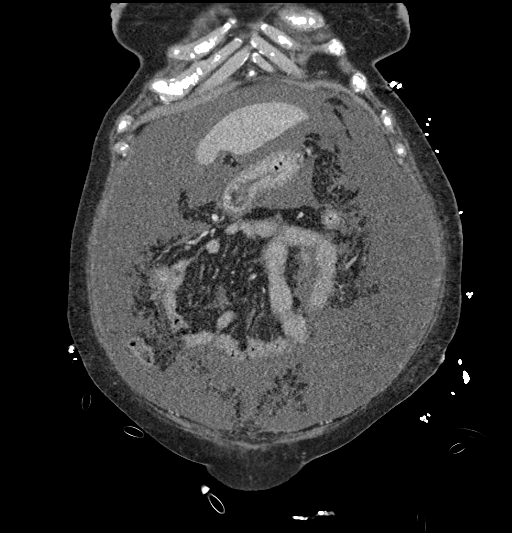
[im 78/176  soft-tissue]
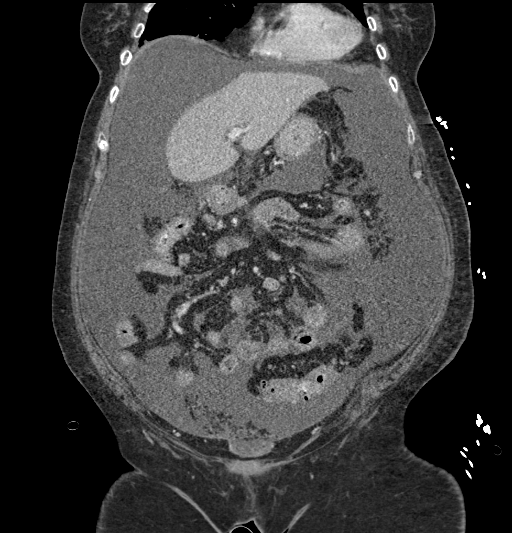
[im 98/176  soft-tissue]
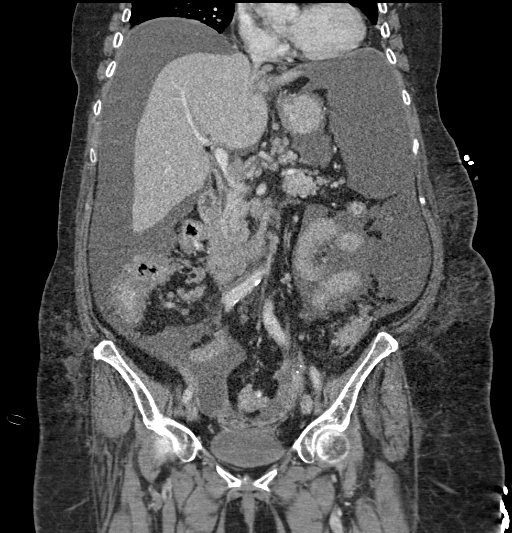

[Series 6: lung bases · axial · 0.87mm/px · z∈[+1376,+1404]mm · 2 of 87 slices shown]
[im 8/87  bone]
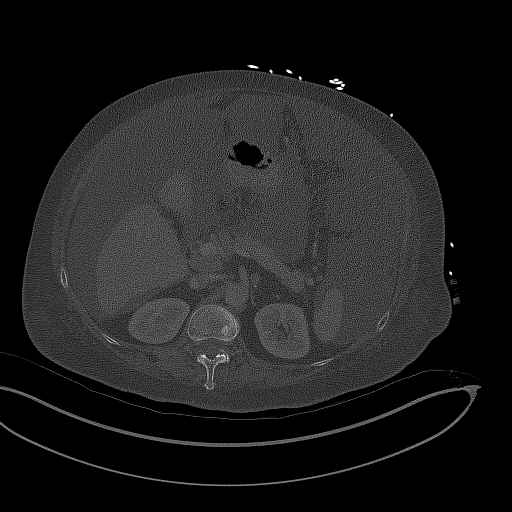
[im 22/87  bone]
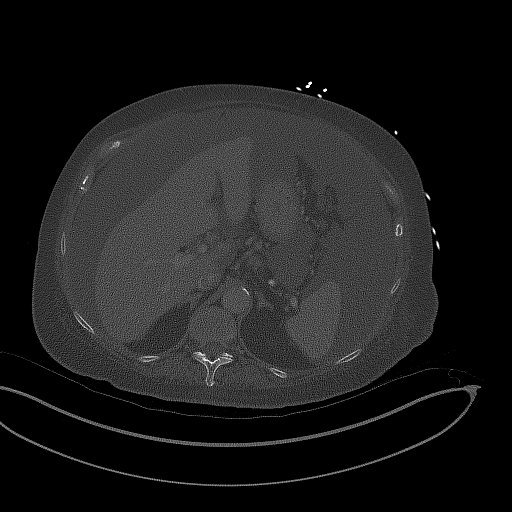

[16 of 46 positions shown; findings below may reference images not displayed]

FINDINGS: Lower chest: Small bilateral pleural effusions are noted with
adjacent subsegmental atelectasis.

Hepatobiliary: No focal liver abnormality is seen. Status post
cholecystectomy. No biliary dilatation.

Pancreas: Unremarkable. No pancreatic ductal dilatation or
surrounding inflammatory changes.

Spleen: Calcified splenic granulomata are noted.

Adrenals/Urinary Tract: Adrenal glands are unremarkable. Kidneys are
normal, without renal calculi, focal lesion, or hydronephrosis.
Bladder is unremarkable.

Stomach/Bowel: Stomach appears normal. There is no evidence of bowel
obstruction or inflammation. Status post appendectomy. Sigmoid
diverticulosis is noted without inflammation.

Vascular/Lymphatic: Aortic atherosclerosis. Mildly enlarged
retroperitoneal and mesenteric lymph nodes are noted which may be
inflammatory in etiology, but malignancy or metastatic disease
cannot be excluded. Largest lymph node measures 13 mm in right
periaortic region.

Reproductive: Status post hysterectomy. 3.8 cm right ovarian cystic
abnormality is noted. No definite left adnexal abnormality is noted.

Other: Moderate ascites is noted. Mild irregular densities are seen
involving the omentum concerning for possible peritoneal
carcinomatosis.

Musculoskeletal: No acute or significant osseous findings.
IMPRESSION: Moderate ascites is noted.

Mildly enlarged retroperitoneal mesenteric lymph nodes are noted,
the largest measuring 13 mm in the right periaortic region. While
these may be inflammatory in etiology, malignancy or metastatic
disease cannot be excluded.

Mild irregular densities are seen involving the omentum which may
represent inflammation, but peritoneal carcinomatosis cannot be
excluded.

Status post hysterectomy. 3.8 cm right ovarian cyst is noted.
Recommend follow-up US in 6-12 months. Note: This recommendation
does not apply to premenarchal patients and to those with increased
risk (genetic, family history, elevated tumor markers or other
high-risk factors) of ovarian cancer. Reference: JACR [DATE]):[id number].

Sigmoid diverticulosis without inflammation.

Small bilateral pleural effusions with minimal adjacent subsegmental
atelectasis.

Aortic Atherosclerosis (AIBCC-2JO.O).

## 2022-09-01 IMAGING — US US TRANSVAGINAL NON-OB
1 series · 15 of 25 positions shown · non-contrast
Comparison: 07/24/2021 CT abdomen/pelvis.

CLINICAL DATA: 80-year-old female inpatient with a history of
hysterectomy and ascites and right adnexal cystic lesion on recent
CT.

EXAM:
ULTRASOUND PELVIS TRANSVAGINAL
TECHNIQUE: Transvaginal ultrasound examination of the pelvis was performed
including evaluation of the uterus, ovaries, adnexal regions, and
pelvic cul-de-sac.

[Series 1: us transvaginal non-ob mc & wl · 29 acquisitions, 15 frames shown]
[im 1/29]
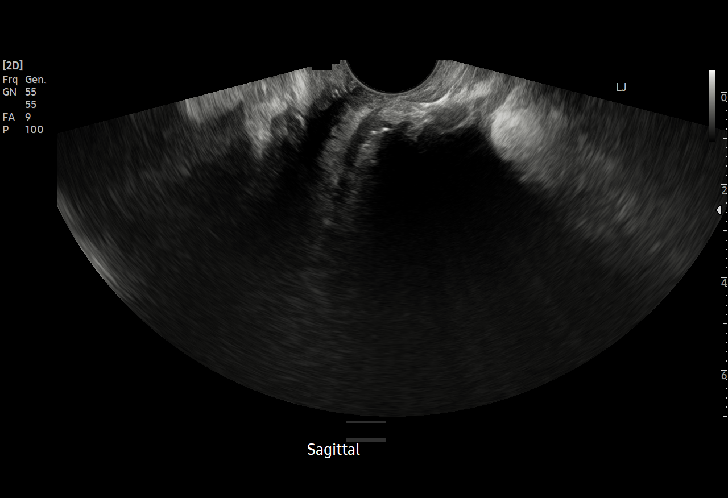
[im 3/29]
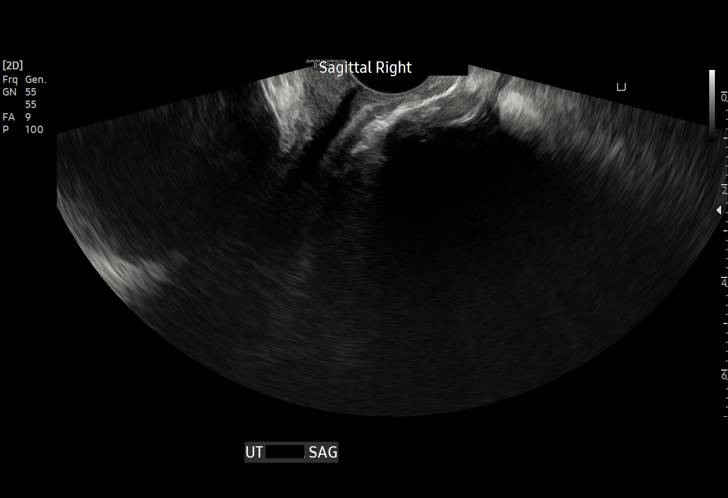
[im 5/29]
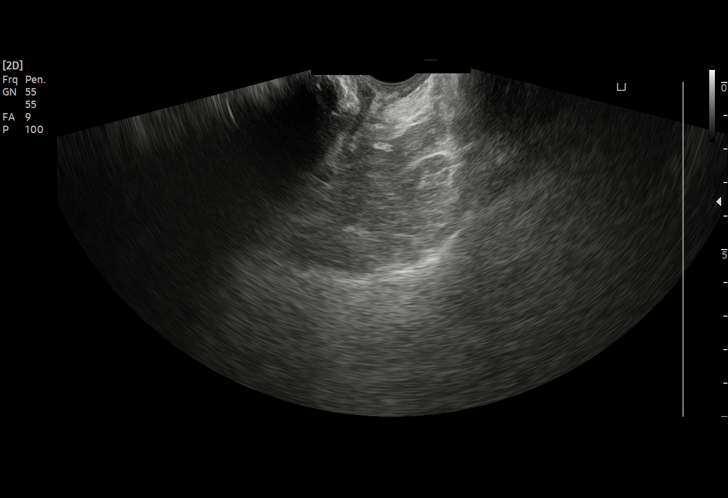
[im 6/29]
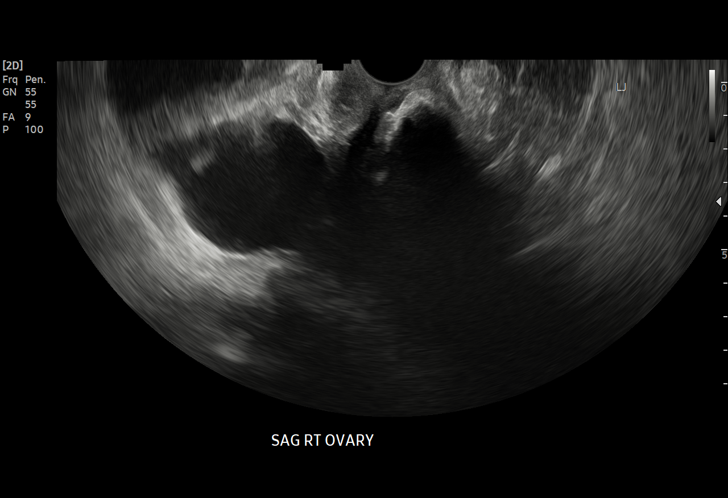
[im 9/29]
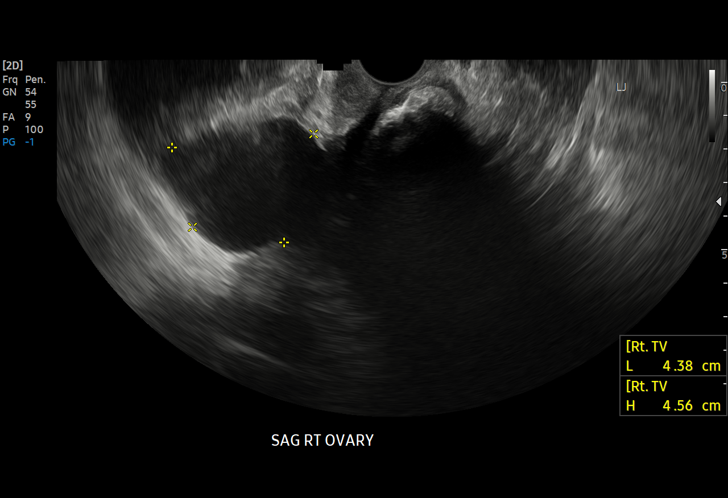
[im 11/29]
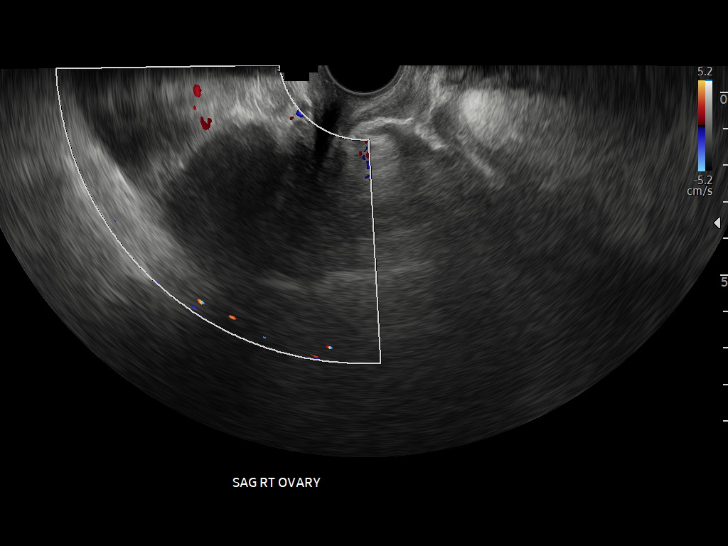
[im 12/29]
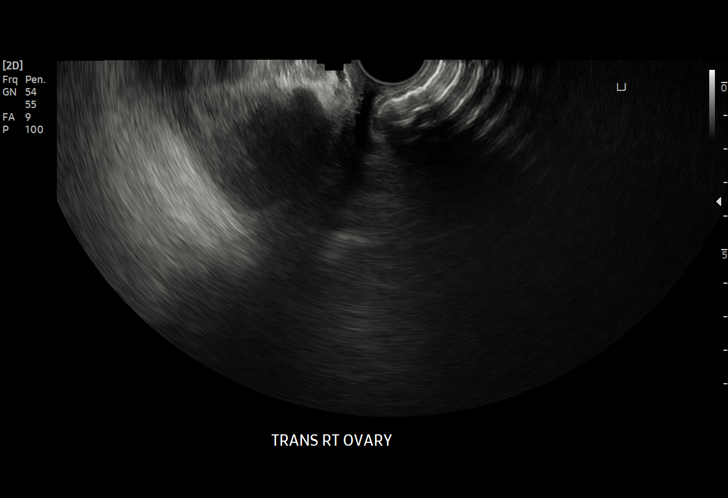
[im 15/29]
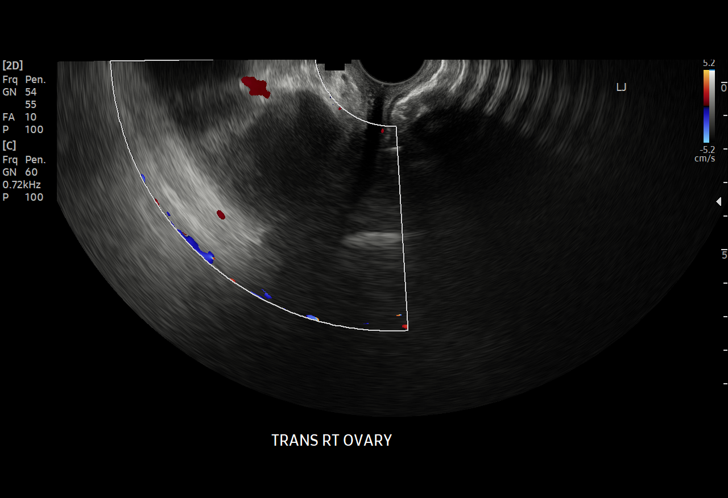
[im 17/29]
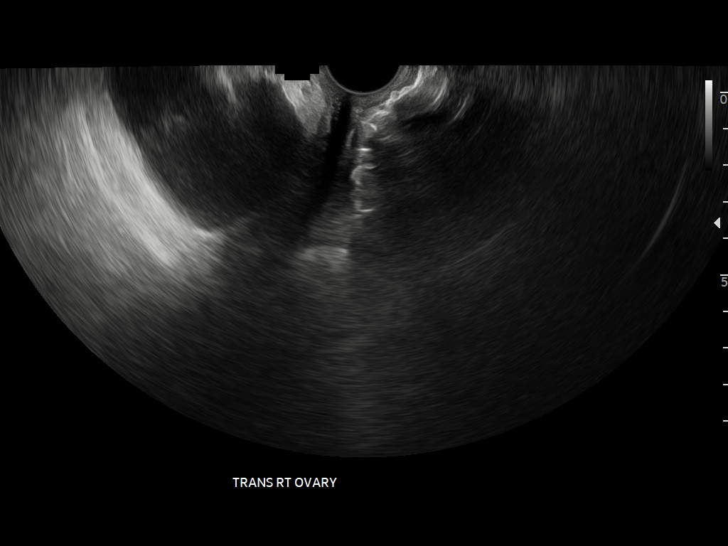
[im 18/29]
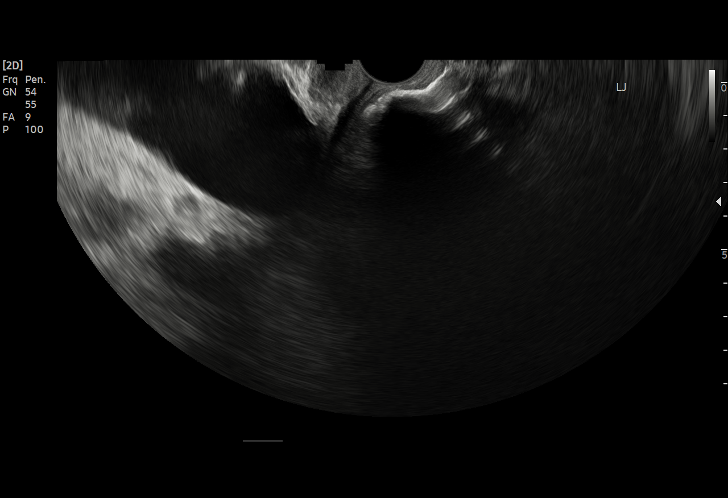
[im 20/29]
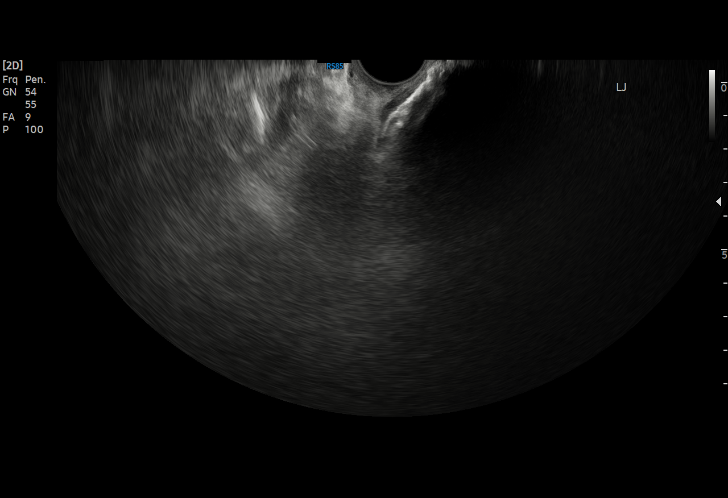
[im 23/29]
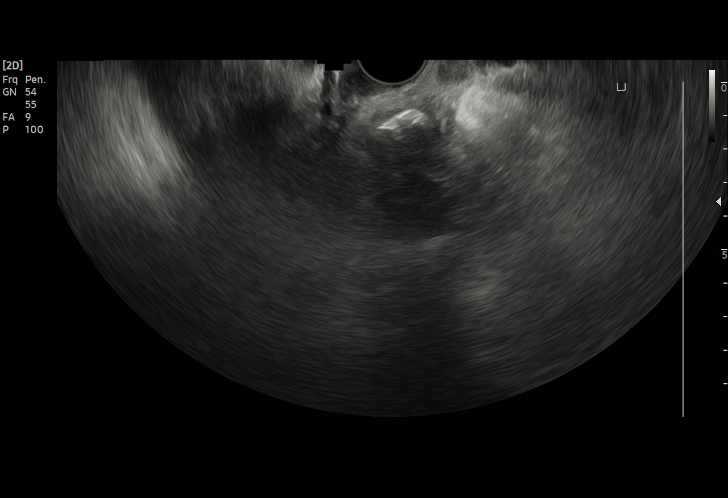
[im 24/29]
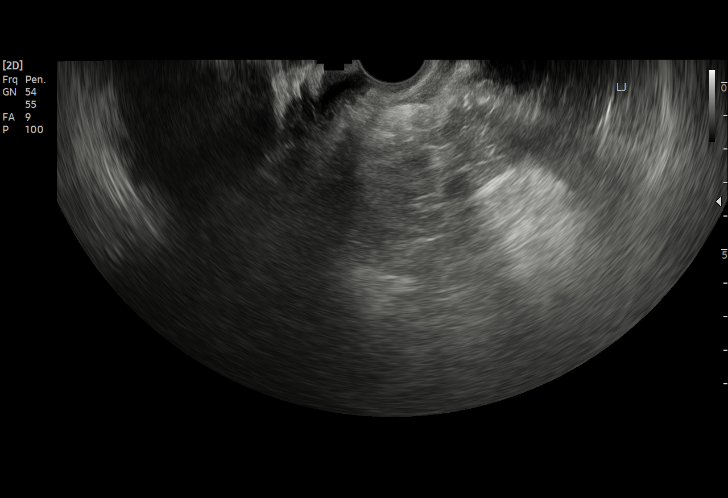
[im 26/29]
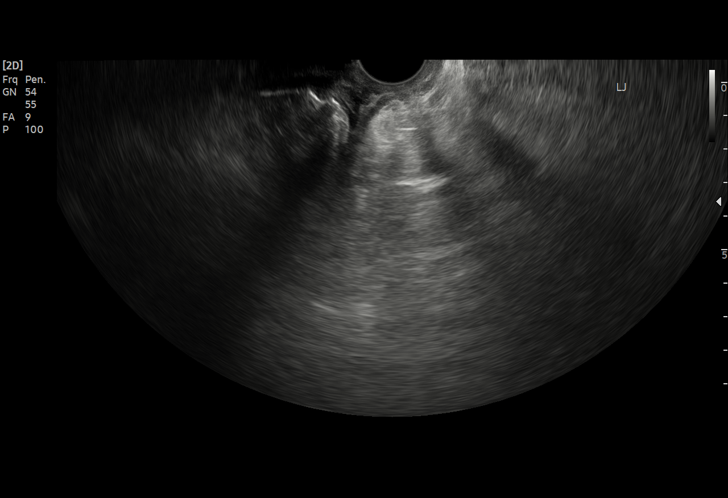
[im 29/29]
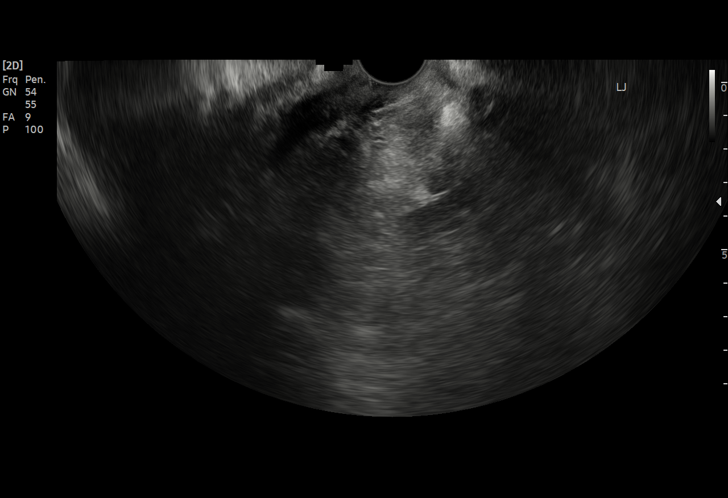

[15 of 25 positions shown; findings below may reference images not displayed]

FINDINGS: Uterus

Surgically absent.

Right ovary

Measurements: 4.6 x 4.4 x 3.5 cm = volume: 37 mL. There is limited
visualization of a 4.2 x 3.9 x 2.8 cm right ovarian cyst with
questionable mildly thickened eccentric septation, and no internal
vascularity on color Doppler or discrete mural nodularity.

Left ovary

Nonvisualization of the left ovary.  No left adnexal masses.

Other findings:  No abnormal free fluid
IMPRESSION: 1. Limited visualization of a 4.2 cm right ovarian cyst, which
appears mildly complex with mildly thickened eccentric septation.
This cystic lesion is indeterminate for malignancy particularly
given the other worrisome findings of large volume ascites and
possible omental caking on the recent CT.
2. No abnormal findings at the hysterectomy margin.
3. Nonvisualization of the left ovary.  No left adnexal masses.

## 2022-09-02 IMAGING — US US ABDOMEN LIMITED
1 series · 4 of 4 positions shown · non-contrast
Comparison: 07/24/2021

CLINICAL DATA: Patient is complaining of leaking at previous
paracentesis puncture site.

EXAM:
LIMITED ABDOMEN ULTRASOUND FOR ASCITES
TECHNIQUE: Limited ultrasound survey for ascites was performed in all four
abdominal quadrants.

[Series 1: us paracentesis mc & wl · 4 of 4 slices shown]
[im 1/4]
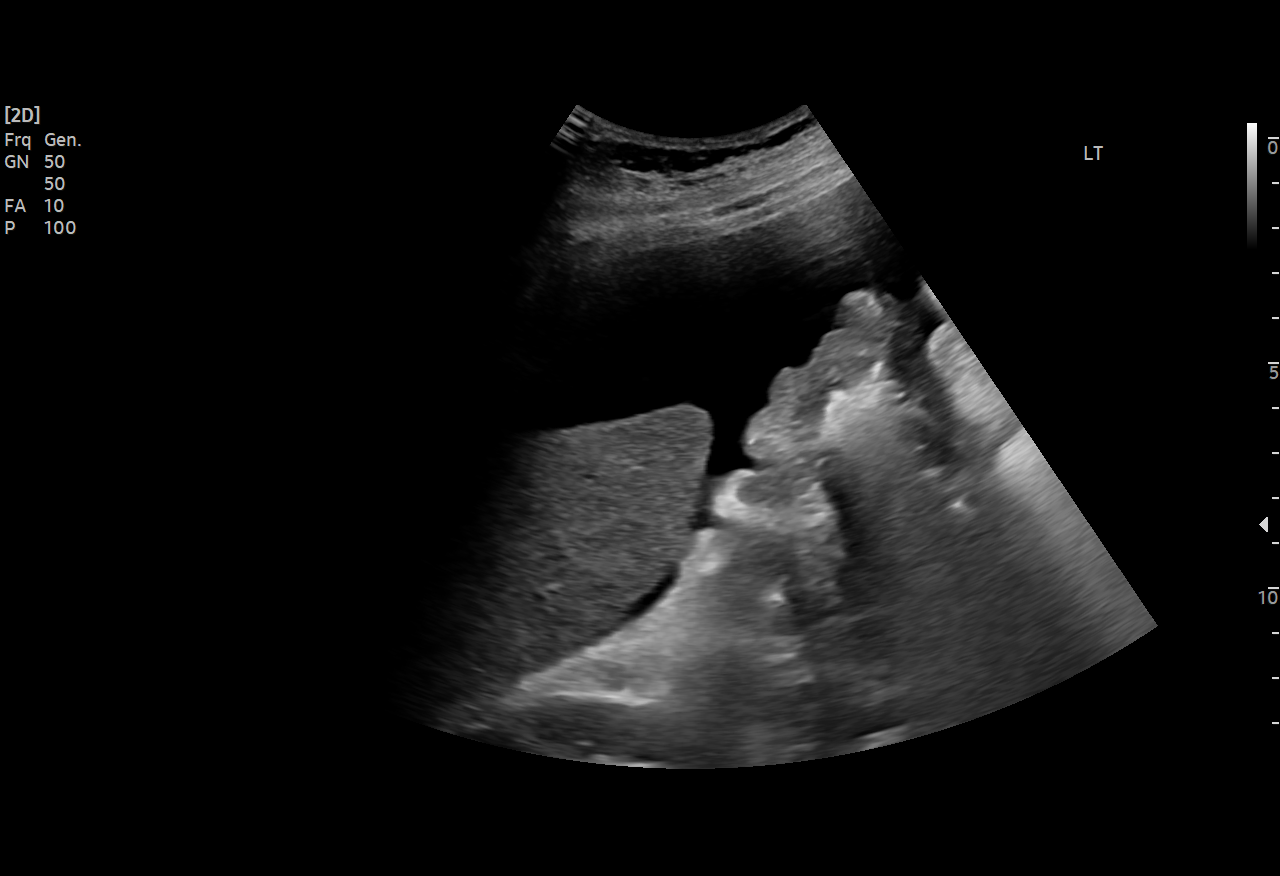
[im 2/4]
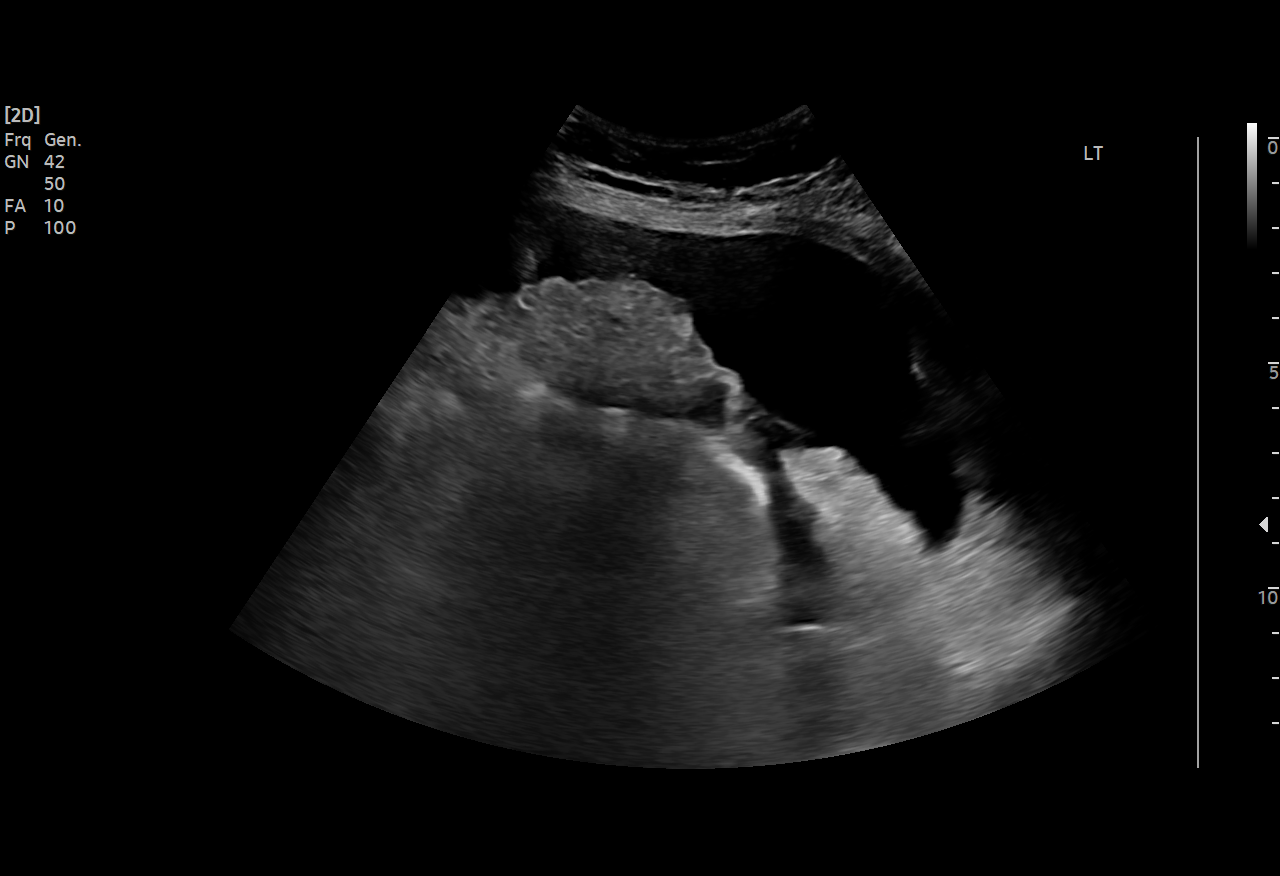
[im 3/4]
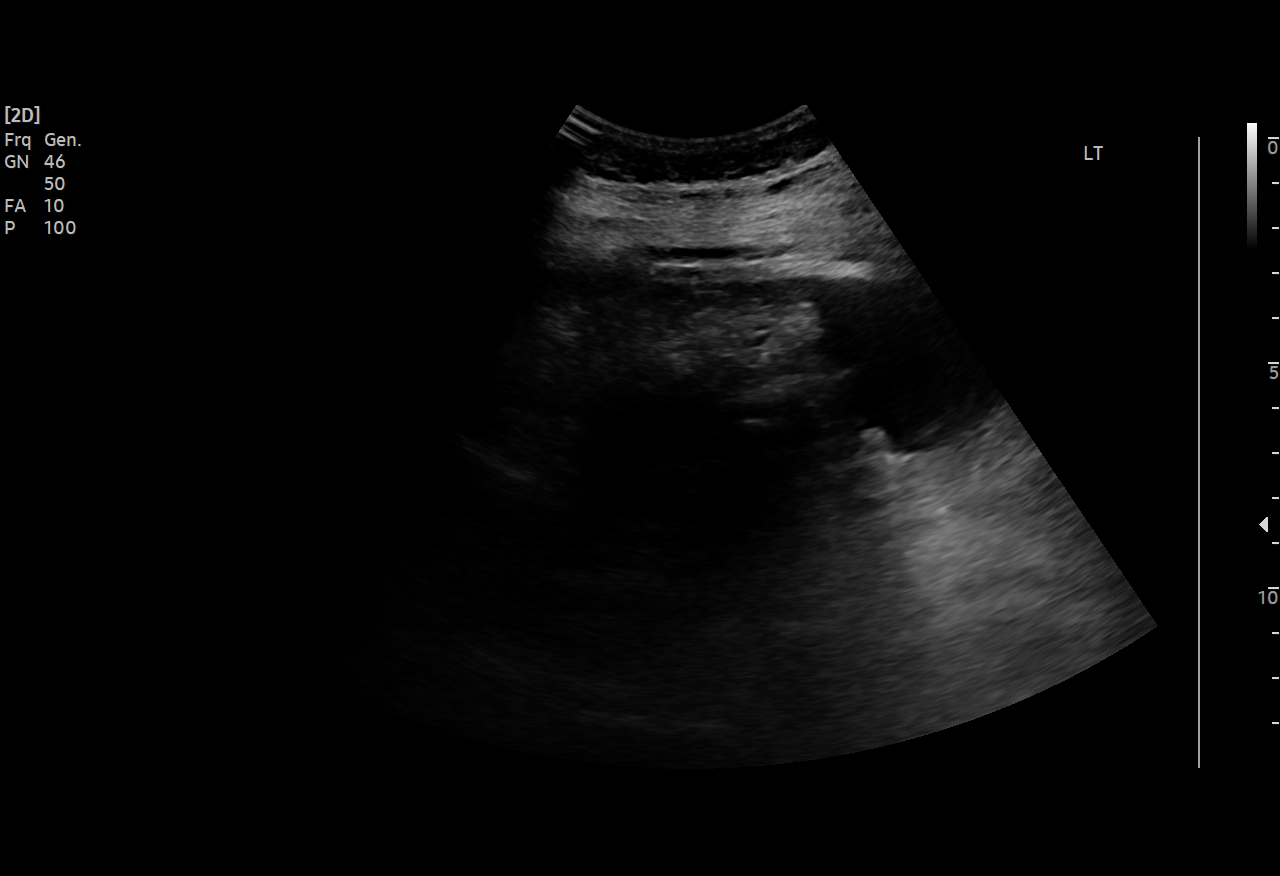
[im 4/4]
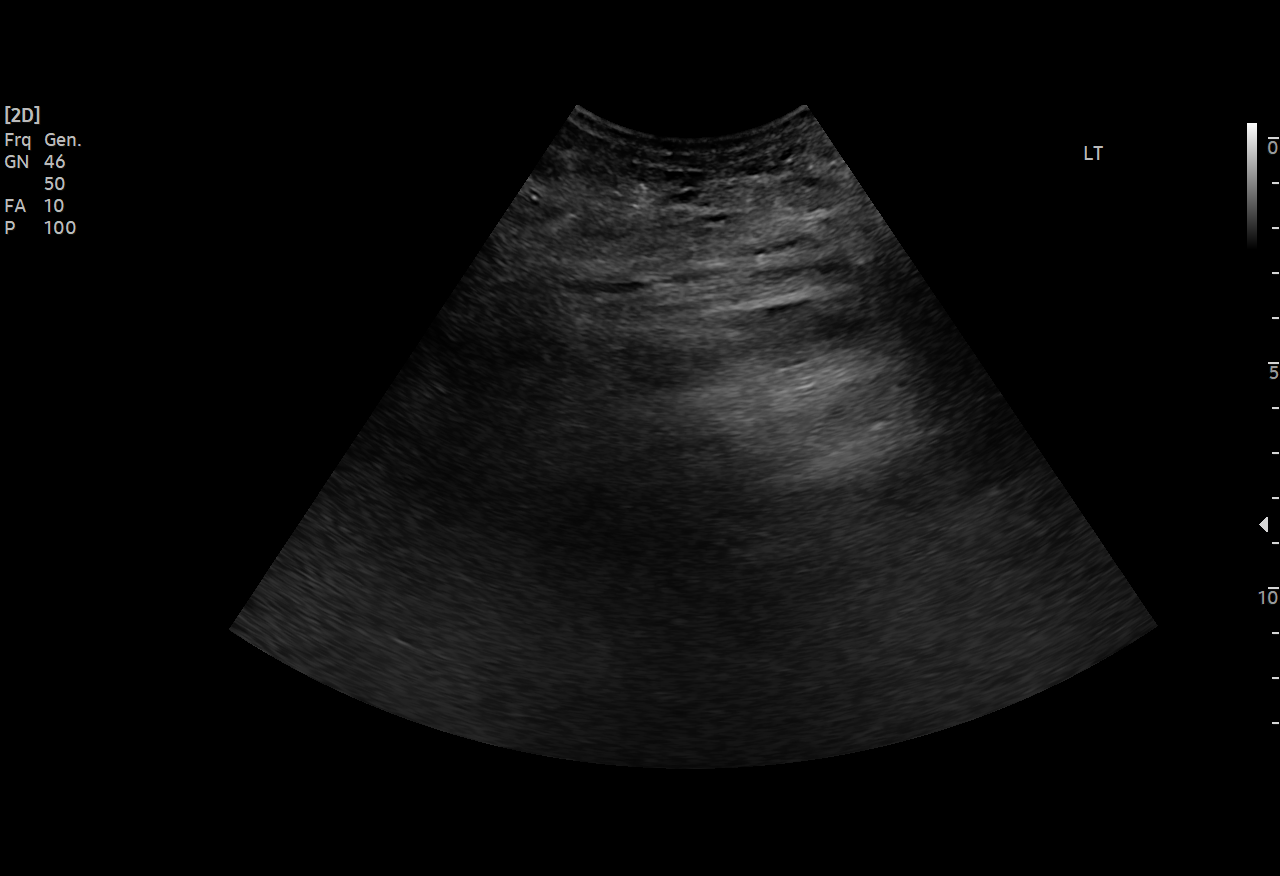

[4 of 4 positions shown; findings below may reference images not displayed]

FINDINGS: Small to moderate amount of ascites in the right upper quadrant and
right lower quadrant. Minimal ascites in left upper quadrant. No
significant ascites in left lower quadrant.
IMPRESSION: Small to moderate amount of ascites in the right abdomen.
Paracentesis not performed.
